# Patient Record
Sex: Male | Born: 1940 | Race: White | Hispanic: No | Marital: Married | Smoking: Former smoker
Health system: Southern US, Community
[De-identification: ages and names within clinical notes are randomized; demographics above are authoritative.]

## PROBLEM LIST (undated history)

## (undated) DIAGNOSIS — N189 Chronic kidney disease, unspecified: Secondary | ICD-10-CM

## (undated) DIAGNOSIS — Z87438 Personal history of other diseases of male genital organs: Secondary | ICD-10-CM

## (undated) DIAGNOSIS — K635 Polyp of colon: Secondary | ICD-10-CM

## (undated) DIAGNOSIS — K759 Inflammatory liver disease, unspecified: Secondary | ICD-10-CM

## (undated) DIAGNOSIS — I10 Essential (primary) hypertension: Secondary | ICD-10-CM

## (undated) DIAGNOSIS — Z87891 Personal history of nicotine dependence: Secondary | ICD-10-CM

## (undated) DIAGNOSIS — I471 Supraventricular tachycardia, unspecified: Secondary | ICD-10-CM

## (undated) DIAGNOSIS — M1990 Unspecified osteoarthritis, unspecified site: Secondary | ICD-10-CM

## (undated) DIAGNOSIS — Z9289 Personal history of other medical treatment: Secondary | ICD-10-CM

## (undated) DIAGNOSIS — J189 Pneumonia, unspecified organism: Secondary | ICD-10-CM

## (undated) DIAGNOSIS — R7301 Impaired fasting glucose: Secondary | ICD-10-CM

## (undated) DIAGNOSIS — J449 Chronic obstructive pulmonary disease, unspecified: Secondary | ICD-10-CM

## (undated) DIAGNOSIS — Z950 Presence of cardiac pacemaker: Secondary | ICD-10-CM

## (undated) DIAGNOSIS — Z136 Encounter for screening for cardiovascular disorders: Secondary | ICD-10-CM

## (undated) HISTORY — DX: Encounter for screening for cardiovascular disorders: Z13.6

## (undated) HISTORY — PX: COLONOSCOPY: SHX174

## (undated) HISTORY — DX: Supraventricular tachycardia, unspecified: I47.10

## (undated) HISTORY — DX: Essential (primary) hypertension: I10

## (undated) HISTORY — PX: REPLACEMENT TOTAL KNEE BILATERAL: SUR1225

## (undated) HISTORY — DX: Personal history of other medical treatment: Z92.89

## (undated) HISTORY — DX: Personal history of nicotine dependence: Z87.891

## (undated) HISTORY — PX: CATARACT EXTRACTION W/ INTRAOCULAR LENS IMPLANT: SHX1309

## (undated) HISTORY — PX: APPENDECTOMY: SHX54

## (undated) HISTORY — DX: Impaired fasting glucose: R73.01

## (undated) HISTORY — DX: Personal history of other diseases of male genital organs: Z87.438

## (undated) HISTORY — DX: Polyp of colon: K63.5

## (undated) HISTORY — DX: Supraventricular tachycardia: I47.1

---

## 2001-05-05 ENCOUNTER — Ambulatory Visit (HOSPITAL_COMMUNITY): Admission: RE | Admit: 2001-05-05 | Discharge: 2001-05-05 | Payer: Self-pay | Admitting: *Deleted

## 2002-12-20 ENCOUNTER — Encounter: Payer: Self-pay | Admitting: Orthopedic Surgery

## 2002-12-21 ENCOUNTER — Inpatient Hospital Stay (HOSPITAL_COMMUNITY): Admission: RE | Admit: 2002-12-21 | Discharge: 2002-12-23 | Payer: Self-pay | Admitting: Orthopedic Surgery

## 2006-05-19 ENCOUNTER — Ambulatory Visit (HOSPITAL_COMMUNITY): Admission: RE | Admit: 2006-05-19 | Discharge: 2006-05-19 | Payer: Self-pay | Admitting: Family Medicine

## 2006-05-22 ENCOUNTER — Observation Stay (HOSPITAL_COMMUNITY): Admission: RE | Admit: 2006-05-22 | Discharge: 2006-05-23 | Payer: Self-pay | Admitting: Orthopedic Surgery

## 2007-05-03 ENCOUNTER — Ambulatory Visit: Payer: Self-pay | Admitting: Surgery

## 2008-06-02 HISTORY — PX: TRANSURETHRAL RESECTION OF PROSTATE: SHX73

## 2008-07-31 DIAGNOSIS — K635 Polyp of colon: Secondary | ICD-10-CM

## 2008-07-31 HISTORY — DX: Polyp of colon: K63.5

## 2008-09-07 ENCOUNTER — Encounter: Admission: RE | Admit: 2008-09-07 | Discharge: 2008-09-07 | Payer: Self-pay | Admitting: Family Medicine

## 2008-10-04 ENCOUNTER — Encounter (INDEPENDENT_AMBULATORY_CARE_PROVIDER_SITE_OTHER): Payer: Self-pay | Admitting: Urology

## 2008-10-04 ENCOUNTER — Ambulatory Visit (HOSPITAL_COMMUNITY): Admission: RE | Admit: 2008-10-04 | Discharge: 2008-10-05 | Payer: Self-pay | Admitting: Urology

## 2010-06-02 DIAGNOSIS — Z9289 Personal history of other medical treatment: Secondary | ICD-10-CM

## 2010-06-02 HISTORY — DX: Personal history of other medical treatment: Z92.89

## 2010-09-10 LAB — CBC
HCT: 39 % (ref 39.0–52.0)
Platelets: 176 10*3/uL (ref 150–400)
WBC: 7.1 10*3/uL (ref 4.0–10.5)

## 2010-09-10 LAB — BASIC METABOLIC PANEL
BUN: 11 mg/dL (ref 6–23)
BUN: 23 mg/dL (ref 6–23)
CO2: 27 mEq/L (ref 19–32)
Calcium: 8.1 mg/dL — ABNORMAL LOW (ref 8.4–10.5)
Chloride: 105 mEq/L (ref 96–112)
Creatinine, Ser: 1.33 mg/dL (ref 0.4–1.5)
GFR calc non Af Amer: 53 mL/min — ABNORMAL LOW (ref >60)
Glucose, Bld: 107 mg/dL — ABNORMAL HIGH (ref 70–99)
Potassium: 4.1 mEq/L (ref 3.5–5.1)
Potassium: 4.4 mEq/L (ref 3.5–5.1)

## 2010-09-10 LAB — HEMOGLOBIN AND HEMATOCRIT, BLOOD
HCT: 41.3 % (ref 39.0–52.0)
Hemoglobin: 14.3 g/dL (ref 13.0–17.0)

## 2010-10-15 NOTE — Op Note (Signed)
NAME:  [name] [name], [name] [name]              ACCOUNT NO.:  [id number]   MEDICAL RECORD NO.:  [id number]          PATIENT TYPE:  AMB   LOCATION:  DAY                          FACILITY:  [facility]   PHYSICIAN:  Martina Sinner, MD DATE OF BIRTH:  09/01/1940   DATE OF PROCEDURE:  DATE OF DISCHARGE:                               OPERATIVE REPORT   PREOPERATIVE DIAGNOSES:  Urinary retention with hydronephrosis.   PREOPERATIVE DIAGNOSES:  Urinary retention with hydronephrosis.   SURGERY:  Transurethral resection of the prostate plus cystoscopy.   Mr. [name] [name] had bilateral hydronephrosis which resolved dramatically  with a Foley catheter.  He is now on clean intermittent catheterization  and had a negative culture prior to surgery.  He is on ciprofloxacin.  His creatinine is 1.4-1.5   The patient was prepped and draped in the usual fashion.  Preoperative  antibiotics were given.  Hemoglobin was normal prior to the procedure.   I used the 28-French resectoscope.  He had a reasonably normal size  urethral meatus, but I did dilate it gently to 30-French allowing me to  insert the telescope.  I entered the urethra under direct vision.   Prior to inserting the Storz resectoscope, which is a three-way device,  I actually cystoscoped the patient with a 21-French 30 degree lens and a  70 degrees lens.  He has over a 2 liter bladder and I scanned the entire  urothelium three times and especially near the bladder neck and floor.  I did not see any tumor.  He had a very high riding bladder neck with a  lobular middle lobe and I really believe that this is what was seen on  CT scan.  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  He had a large middle lobe.  He had bilobar enlargement of the  prostate.   Using the resectoscope, I identified the interureteric ridge and  ureteral orifices.  I then resected the middle lobe just flattening it  nearly level with the floor of the  bladder, but I did not undermine the  trigone or over resect.  I did my half loop technique of the bladder  neck and eventually deepened this.  I then used the well identified  verumontanum to resect to the surgical capsule both walls of the  prostate with a modest amount of tissue at 5 and 7 o'clock.   He had soft tissue falling in at the level of the bladder neck between  10 and 2 o'clock.  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  It was resected.  He had a moderate amount of apical  tissue, and I was very careful in resecting the apical tissue in a  conservative fashion using the proximal verumontanum as a marker.  I was  also aware during the case to minimize the length of my resection  between 2 and 10 o'clock,  since he had a short prostatic urethra and I  did not want to jeopardize the sphincter.  He did have a moderate amount  of tissue falling down from above.   There was very little bleeding during the case.  I went to the surgical  capsule leaving some tissue at the apex, but I was very happy opening  the bladder neck and removing what was probably an obvious obstructing  middle lobe.  I emptied the bladder manner and our in and out volume  corresponded very well.  There was no suprapubic fullness.  I irrigated  all fragments and had a good look inside the large bladder and saw no  more residual fragments.  I was happy with hemostasis.   At the end of the case, I checked the sphincter.  I checked this twice  and I felt that his sphincter actually looked a little bit open and it  was not necessarily due to the entire bladder and prostatic urethra  being full.  He does not have a risk factor necessarily for a denervated  sphincter, but it was a little bit concerning.  I looked carefully and  my resection was  not impinging upon the sphincter and again throughout  the case, I used the proximal verumontanum, not resecting beyond this.   With some fluid in the bladder, I inserted a 24-French irrigating  catheter.  I then irrigated the bladder and it was nearly clear.  I did  not put the catheter on retention, but 30 mL of sterile water was placed  in the balloon.  The catheter was draining well with continual bladder  irrigation at the end of the case.   I am hoping this improves the efficiency of Mr. [name] [name]'s emptying.  I  am going to ask the pathologist to look for any transitional cell  carcinoma of the bladder, but I will be very surprised if he had any.  I  will be diligent postprocedure in checking Mr. [name] [name]'s continence  status because of the findings described above.           ______________________________  Martina Sinner, MD  Electronically Signed     SAM/MEDQ  D:  10/04/2008  T:  10/04/2008  Job:  [id number]

## 2010-10-15 NOTE — Assessment & Plan Note (Signed)
OFFICE VISIT   [name] [name], [name] [name]  DOB:  February 17, 1941                                       05/03/2007  CHART#:[id number]   REASON FOR VISIT:  Numb feet.   HISTORY:  This is a 70 year old gentleman I am seeing at the request of  Dr. Theresia Lo for evaluation of numbness in both of his feet,  specifically in his great toe.  Patient states that this has been going  on for several months.  He does not have any aggravating or relieving  factors.  It is not worse at any point in time during the day, whether  it be morning or evening.  He does not state that it is better or worse  after long periods of standing.  He has never had anything like this  before.  He has no history of DVT.  He has no history of diabetes.  He  has no history of gout.  He describes it as a numbness in the bottom of  his great toe extending to the ball of his foot.  He denies having any  history of ulcers.   REVIEW OF SYSTEMS:  GENERAL:  No weight gain, no weight loss.  CARDIAC:  Negative.  PULMONARY:  Negative.  GI:  Negative.  GU:  Negative.  VASCULAR:  As above.  NEURO:  Negative.  ORTHO:  Negative.  PSYCH:  Negative.  ENT:  Negative.  HEME:  Negative.   PAST MEDICAL HISTORY:  Significant for hypertension.   PAST SURGICAL HISTORY:  Bilateral knee replacements, appendectomy, right  knee tendon repair.   FAMILY HISTORY:  Negative.   SOCIAL HISTORY:  He is married with 2 children and retired.  He does not  smoke.  He has a history of smoking, quit in 1987.  He drinks 2 glasses  of wine per day.   MEDICATIONS:  Micardis/HCTZ 80/12.5 1 per day, aspirin 81 mg per day.   ALLERGIES:  None.   PHYSICAL EXAMINATION:  VITAL SIGNS:  Heart rate 67, blood pressure  133/79, O2 saturation 97%.  GENERAL:  He is well appearing, in no acute distress.  HEENT:  His pupils are equal.  His sclerae are anicteric.  CARDIOVASCULAR:  Regular.  RESPIRATORY:  Nonlabored.  ABDOMEN:  Soft,  nontender.  EXTREMITIES:  He has 1+ edema in both lower extremities.  There are  palpable dorsalis pedis pulses bilaterally.  There are no open wounds.  There is no cellulitis. There are small, but visible, engorged veins in  his lower extremities.   DIAGNOSTIC STUDIES:  Duplex evaluation was performed today which reveals  normal arterial vasculature.   ASSESSMENT AND PLAN:  Bilateral lower extremity numbness.   PLAN:  The patient does not have any signs or symptoms of arterial  pathology.  On physical exam he does have evidence of edema as well as  prominent veins in his lower extremities.  His numbness could be due to  venous insufficiency.  We discussed about leg elevation and I have given  him a prescription for knee high 20 to 30 mm compression stockings for  him to use on a trial basis.  I have scheduled him to come back for a  venous insufficiency duplex in 3 months and to see me following that.  If he has had no improvement with leg elevation and compression, it  is  unlikely that his numbness is due to venous insufficiency and;  therefore, we will need to pursue another etiology for his difficulties.   Jorge Ny, MD  Electronically Signed   VWB/MEDQ  D:  05/03/2007  T:  05/04/2007  Job:  [id number]   cc:   Vikki Ports, M.D.

## 2010-10-18 NOTE — Op Note (Signed)
NAME:  GUENTHER, DUNSHEE                        ACCOUNT NO.:  [id number]   MEDICAL RECORD NO.:  [id number]                   PATIENT TYPE:  INP   LOCATION:  2899                                 FACILITY:  [facility]   PHYSICIAN:  Feliberto Gottron. Turner Daniels, M.D.                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   PROCEDURE:  Left total knee arthroplasty using Osteonics Scorpio components,  all cemented, 11 femur, 11 tibia, 9 patella, and 10 PS spacer.   SURGEON:  Feliberto Gottron. Turner Daniels, M.D.   FIRST ASSISTANT:  Skip Mayer, P.A.-C.   ANESTHESIA:  General endotracheal anesthesia.   ESTIMATED BLOOD LOSS:  Minimal.   FLUIDS REPLACED:  1500 mL crystalloid.   TOURNIQUET TIME:  1 hour 15 minutes.   INDICATIONS FOR PROCEDURE:  70 year old man who has had a right total knee  in the past, done well, and now desires a left total knee for bone on bone  arthritis of the left knee with a 15 degree flexion contracture, 10 degree  varus configuration to the knee, and some mild subluxation of the tibia  under the femur.  He has failed conservative treatment with anti-  inflammatory medicines and exercise and desires elective left total knee  arthroplasty.   DESCRIPTION OF PROCEDURE:  The patient was identified by arm band and taken  to the operating room at [facility].  Appropriate anesthetic  monitors were attached and general endotracheal anesthesia was induced with  the patient in the supine position.  Lateral posts and foot positioner  applied to the table.  A tourniquet was applied high to the left thigh and  the left lower extremity prepped and draped in the usual sterile fashion  from  the ankle to the tourniquet, limb bent to 90 degrees, tourniquet  inflated to 350 mmHg and we began the procedure by making an anterior  midline incision 18 cm in length centered over the patella, cutting through  the skin and subcutaneous tissue down to the level of the transverse  retinaculum over the patella which was then reflected medially allowing a  standard medial parapatellar arthrotomy.  The superficial medial collateral  ligament was peeled anterior to posterior keeping it intact distally off the  tibia to enhance the exposure of the medial side and also to release the  medial ligaments.  The patella was everted, the prepatellar fat pad was then  resected with electrocautery and the knee was hyperflexed.  The anterior  half of the medial and lateral menisci were then resected along with the  cruciate ligaments using the electrocautery.  A Z-retractor was placed  posteromedial through the notch bringing the tibia forward and a Holman  laterally enhancing the proximal tibial exposure.   The proximal tibia was then instrumented with the Osteonics step drill  followed by the IM rod and a 0 degree posterior slope cutting guide.  We  resected 3-4 mm of bone medially, 10 mm of bone laterally, consistent with a  varus deformity.  The distal femur was then entered with the Osteonics step  drill 10 mm anterior to the PCL origin and a 5 degree left distal femoral  cutting guide was then pinned into place using the IM rod and the distal 10  mm of the femur resected.  We sized for a number 11 femoral component,  externally rotated the cutting block 3 degrees, and performed our standard  anterior, posterior, and chamfer cuts followed by the [employer] box  cut.  The everted patella was then grasped with the patellar cutting guide  and the posterior 9-10 mm of the patella resected.  It was sized for a  number 9 modified patellar button and drilled to the template.  A trial #11  left  femoral component was hammered into place, a #11 trial tibial baseplate  with a 10 PS trial spacer was placed in the tibia.  The knee was taken  through a range of motion with excellent stability noted, good tracking of  the trial patellar component.   At this point, the trials were removed, the knee was hyperflexed, the  proximal tibia once again exposed, and an 11 tibial baseplate trial hammered  into place allowing the delta fit keel punches up to an 11/13 cemented delta  keel punch.  All bony surfaces were then cleaned and dried with suction and  sponges.  A double batch of Palacos polymethyl methacrylate cement with 1500  mg of Zinacef was then mixed and applied to the mating surfaces of the 11  tibial baseplate, 11 femoral component, and the patella as well as to all  bony mating surfaces except for the posterior condyles of the femur.  In  order, we then hammered into place the #11 tibial baseplate, removed the  excess cement, the #11 left femoral component, and once again removed the  excess cement, and then snapped in the 10 PS spacer.  The knee was reduced  and taken to full extension, further compressing the cement.  The patella  was then fixed using the 9 modified medial patellar button which was then  squeezed into place with the clamp and excess cement removed.  The knee was  taken to 90 degrees of flexion while keeping it compressed to allow further  removal of any excess bits of cement.  The cement was allowed to cure.  Medium Hemovacs were placed in deep in the wound after taking the knee  through a range of motion and documenting stability.  The knee was once  again water picked cleaned, and the knee was then closed with running #1  Vicryl suture in the parapatellar arthrotomy, 0 and 2-0 undyed Vicryl suture  in the subcutaneous tissue, and skin staples, followed by a dressing of Xeroform, 4 by 4 dressing sponges, Webril, and an Ace wrap.  The patient was  then awakened  and taken to the recovery room without difficulty.  Feliberto Gottron. Turner Daniels, M.D.    Ovid Curd  D:  12/21/2002  T:  12/21/2002  Job:  [id number]

## 2010-10-18 NOTE — Discharge Summary (Signed)
NAME:  [name] [name], [name] [name]                        ACCOUNT NO.:  [id number]   MEDICAL RECORD NO.:  [id number]                   PATIENT TYPE:  INP   LOCATION:  5031                                 FACILITY:  [facility]   PHYSICIAN:  Feliberto Gottron. Turner Daniels, M.D.                DATE OF BIRTH:  [birth year]   DATE OF ADMISSION:  12/21/2002  DATE OF DISCHARGE:  12/23/2002                                 DISCHARGE SUMMARY   PRIMARY DIAGNOSIS FOR THIS ADMISSION:  End-stage degenerative joint disease  of the left knee.   PROCEDURE WHILE IN HOSPITAL:  Left total knee arthroplasty.   DISCHARGE SUMMARY:  The patient is a 70 year old man who has had a right  total knee in the past and has done well with this. He now desires a left  total knee for bone on bone arthritis of the left knee with 15 degree  flexion contracture, 10 degree varus configuration, and some mild  subluxation of the tibia into the femur. He has failed conservative  treatment including anti-inflammatory medication, PT, rest, and pain  medications. He now wishes a total knee arthroplasty after discussing risks  versus benefits.   ALLERGIES:  No known drug allergies.   MEDICATIONS AT TIME OF ADMISSION:  Bisoprolol 6/6.25, Bextra.   PAST MEDICAL HISTORY:  1. Childhood diseases.  2. Hypertension.  3. DJD.  4. 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  5. Questionable hepatitis in the past.   PAST SURGICAL HISTORY:  1. Appendectomy in 1960.  2. Right total knee arthroplasty in 1997. No difficulties with GET.   SOCIAL HISTORY:  No tobacco. Positive ethanol, one to two alcohol beverages  per week. No IV drug abuse. 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   FAMILY HISTORY:  Mother alive at 67. Father died at 23 accidental death but  with a positive history of kidney disease and heart disease.   REVIEW OF SYSTEMS:  Positive for glasses, decreased hearing, no dentures. He  denies any  shortness of breath or chest pain.   PHYSICAL EXAMINATION:  VITAL SIGNS:  Temperature 97.2, pulse 60,  respirations 16, blood pressure 130/80.  GENERAL:  The patient is a 6 foot 1, 205-pound male.  HEENT:  Head is normocephalic, atraumatic. Ears:  TMs are clear. Eyes:  Pupils are equal, round, and reactive to light and accommodation. Nose:  Patent. Throat:  Benign.  NECK:  Full range of motion.  CHEST:  Clear to auscultation.  HEART:  Regular rate and rhythm.  ABDOMEN:  Soft, nontender.  EXTREMITIES:  Right total knee arthroplasty, has well healed normal scar  with range of motion 0 to 120, stable ligamentously. Left knee range of  motion 10 to 100 degrees, 10 degrees varus deformity, stable ligamentously,  positive pain with range of motion, positive crepitus.   STUDIES:  X-rays showed bone  on bone changes in the medial compartment with  positive osteophyte formation. Preoperative labs including CBC, CMET, chest  x-ray, EKG, PT, and PTT were all within normal limits with the exception of  sinus bradycardia on the EKG and a creatinine of 1.8.   HOSPITAL COURSE:  On the date of admission, the patient was taken to the  operating room at [facility] where he underwent a left total knee  arthroplasty with Osteonics Scorpio components, also a cemented #11 femur,  #11 tibia, #9 patella, 10-mm PS spacer. The patient was placed on  preoperative antibiotics for the first 48 hours. He was placed on  postoperative Coumadin prophylaxis with a target INR of 1.5 to 2 per  pharmacy protocol. He was placed on a PCA Dilaudid pump postoperatively for  pain control. Physical therapy was begun with CPM in the postoperative area  as well as physical therapy the first postoperative day. Postoperative day  #1, the patient was without complaint of nausea or vomiting and was using  PCA occasionally. Vital signs were stable. T-max 100.4. Dressing was dry.  Hemovac output 550 per shift. He was  neurovascularly intact to all motors  and light touch. Hemoglobin 10.3. Otherwise, making slow and steady  progress. His PCA was discontinued. Postoperative day #2, the patient was  without complaint, out of bed with physical therapy's help. T-max 99.1,  vital signs were stable. Hemoglobin 10.4. INR 1.1. Dressing was dry. Hemovac  was discontinued. Calf was soft. He continued to make steady progress with  physical therapy, and [facility] had been contacted to coordinate his  home care, including his durable medical goods. He was found to be having  met his physical therapy goals by that afternoon session and was otherwise  medically stable, doing well on oral pain medications and eating and  drinking without nausea or vomiting, voiding without difficulty, out of bed  and other transfers only minimal assist.   He was thus discharged home to the care of his family. He will have Vicodin  for pain medication. He will continue with his Coumadin per pharmacy  protocol, followed by the pharmacist at [facility] for two weeks  postoperatively. He will continue to exercise total knee precautions and  weight bearing using a walker, weight bearing as tolerated. Diet is regular.  He will return to clinic in one week's time, or sooner if he should have any  difficulties with the wound, fever, or pain that is not well controlled with  oral pain medications.      Laural Benes. Jannet Mantis.                     Feliberto Gottron. Turner Daniels, M.D.    Merita Norton  D:  01/23/2003  T:  01/24/2003  Job:  [id number]

## 2010-10-18 NOTE — Op Note (Signed)
[name] [name] NO.:  [id number]   MEDICAL RECORD NO.:  [id number]          PATIENT TYPE:  INP   LOCATION:  5003                         FACILITY:  [facility]   PHYSICIAN:  Feliberto Gottron. Turner Daniels, M.D.   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   PROCEDURE:  Right quad tendon repair using two #5 fiber wires through  vertical drill holes in the patella and a running whip stitch up and  down the tendon x2.   SURGEON:  Feliberto Gottron. Turner Daniels, M.D.   FIRST ASSISTANT:  Skip Mayer, P.A.-C.   ANESTHETIC:  General endotracheal.   ESTIMATED BLOOD LOSS:  100 mL.   FLUID REPLACEMENT:  800 mL crystalloid.   DRAINS PLACED:  None.   TOURNIQUET TIME:  1 hour.   INDICATIONS FOR PROCEDURE:  A 70 year old gentleman who had a left total  knee done by me some years ago.  On the right side, he had a hybrid  total knee done about 10 years ago somewhere up in [place].  Was moving  some laundry, slipped and sustained a right quad rupture 2-3 days ago.  Seen by my partner, Dr. Althea Charon.  MRI scan confirmed the diagnosis and  is taken for right quad tendon repair since he has absolutely no active  extension power to his right leg.  Risks and benefits of surgery  discussed.  Plain radiographs show the total knee to be well-fixed.  MRI  scan shows the quad tendon rupture.   DESCRIPTION OF PROCEDURE:  The patient identified by armband, taken to  the operating room at [facility].  Appropriate site  monitors were attached and general endotracheal anesthesia induced with  the patient in supine position.  He received 1 g of Ancef  preoperatively.  A tourniquet was applied high to the right thigh and  right lower extremity prepped and draped in usual  sterile fashion from  the ankle to the tourniquet.  The limb was wrapped with an Esmarch  bandage, tourniquet inflated to 350 mmHg.  We began the procedure by  reproducing the proximal half of the total knee incision and then going  proximally for another 2 cm.  Small bleeders in the skin and  subcutaneous tissue identified and cauterized.  Transverse retinaculum  identified and divided medially and laterally.  We immediately  encountered the complete total quad rupture off the proximal pole of the  patella.  The ends of the quad rupture were then freshened with a #10  blade, removing a millimeter or 2 of tissue from either side, and then  we placed vertical drill holes in the patella going from proximal to  distal with a 1/8-inch drill bit.  We then passed a double loop of #5  fiber wire through the proximal aspect of one drill hole, brought across  distally to the other drill hole and  then brought it back up.  So we now  had 2 free end sutures looped through the patella.  Double running whip  stitch was then passed up and down the quad tendon with two more fiber  wires, and then the free ends were tied medial and lateral x2, obtaining  a good firm repair of the quad tendon.  We then over sewed the repair  with running #1 Vicryl, going from medial to lateral and lateral to  medial to complete the repair.  The tourniquet was let down.  Small  bleeders identified and cauterized.  The wound was thoroughly irrigated  out with normal saline solution.  The subcutaneous tissue was then  closed with 0-0 and 2-0 Vicryl suture and the skin with skin staples.  A  dressing of Xeroform, 4x4 dressing, sponges, Webril and an Ace wrap and  a knee immobilizer applied.  The patient was then awakened and taken to  recovery room without difficulty.      Feliberto Gottron. Turner Daniels, M.D.  Electronically Signed     FJR/MEDQ  D:  05/22/2006  T:  05/23/2006  Job:  [id number]

## 2011-06-22 DIAGNOSIS — N390 Urinary tract infection, site not specified: Secondary | ICD-10-CM | POA: Diagnosis not present

## 2011-06-22 DIAGNOSIS — N3000 Acute cystitis without hematuria: Secondary | ICD-10-CM | POA: Diagnosis not present

## 2011-07-03 DIAGNOSIS — R7301 Impaired fasting glucose: Secondary | ICD-10-CM | POA: Diagnosis not present

## 2011-07-03 DIAGNOSIS — I10 Essential (primary) hypertension: Secondary | ICD-10-CM | POA: Diagnosis not present

## 2011-07-03 DIAGNOSIS — R799 Abnormal finding of blood chemistry, unspecified: Secondary | ICD-10-CM | POA: Diagnosis not present

## 2011-07-03 DIAGNOSIS — N3000 Acute cystitis without hematuria: Secondary | ICD-10-CM | POA: Diagnosis not present

## 2011-08-24 DIAGNOSIS — R300 Dysuria: Secondary | ICD-10-CM | POA: Diagnosis not present

## 2011-08-24 DIAGNOSIS — R319 Hematuria, unspecified: Secondary | ICD-10-CM | POA: Diagnosis not present

## 2011-08-24 DIAGNOSIS — N3000 Acute cystitis without hematuria: Secondary | ICD-10-CM | POA: Diagnosis not present

## 2011-09-09 DIAGNOSIS — R82998 Other abnormal findings in urine: Secondary | ICD-10-CM | POA: Diagnosis not present

## 2011-09-09 DIAGNOSIS — R339 Retention of urine, unspecified: Secondary | ICD-10-CM | POA: Diagnosis not present

## 2012-01-21 DIAGNOSIS — N281 Cyst of kidney, acquired: Secondary | ICD-10-CM | POA: Diagnosis not present

## 2012-01-21 DIAGNOSIS — N1330 Unspecified hydronephrosis: Secondary | ICD-10-CM | POA: Diagnosis not present

## 2012-01-21 DIAGNOSIS — R339 Retention of urine, unspecified: Secondary | ICD-10-CM | POA: Diagnosis not present

## 2012-03-21 DIAGNOSIS — N390 Urinary tract infection, site not specified: Secondary | ICD-10-CM | POA: Diagnosis not present

## 2012-05-28 DIAGNOSIS — Z23 Encounter for immunization: Secondary | ICD-10-CM | POA: Diagnosis not present

## 2012-09-07 DIAGNOSIS — N289 Disorder of kidney and ureter, unspecified: Secondary | ICD-10-CM | POA: Diagnosis not present

## 2012-09-07 DIAGNOSIS — R7301 Impaired fasting glucose: Secondary | ICD-10-CM | POA: Diagnosis not present

## 2012-09-07 DIAGNOSIS — I10 Essential (primary) hypertension: Secondary | ICD-10-CM | POA: Diagnosis not present

## 2012-09-07 DIAGNOSIS — Z1331 Encounter for screening for depression: Secondary | ICD-10-CM | POA: Diagnosis not present

## 2012-09-07 DIAGNOSIS — E7800 Pure hypercholesterolemia, unspecified: Secondary | ICD-10-CM | POA: Diagnosis not present

## 2012-10-12 DIAGNOSIS — H2510 Age-related nuclear cataract, unspecified eye: Secondary | ICD-10-CM | POA: Diagnosis not present

## 2012-10-14 DIAGNOSIS — R82998 Other abnormal findings in urine: Secondary | ICD-10-CM | POA: Diagnosis not present

## 2012-11-11 DIAGNOSIS — R300 Dysuria: Secondary | ICD-10-CM | POA: Diagnosis not present

## 2013-01-05 DIAGNOSIS — H6980 Other specified disorders of Eustachian tube, unspecified ear: Secondary | ICD-10-CM | POA: Diagnosis not present

## 2013-01-05 DIAGNOSIS — H9190 Unspecified hearing loss, unspecified ear: Secondary | ICD-10-CM | POA: Diagnosis not present

## 2013-01-21 DIAGNOSIS — R339 Retention of urine, unspecified: Secondary | ICD-10-CM | POA: Diagnosis not present

## 2013-01-21 DIAGNOSIS — N1330 Unspecified hydronephrosis: Secondary | ICD-10-CM | POA: Diagnosis not present

## 2013-03-16 DIAGNOSIS — Z23 Encounter for immunization: Secondary | ICD-10-CM | POA: Diagnosis not present

## 2013-03-16 DIAGNOSIS — R82998 Other abnormal findings in urine: Secondary | ICD-10-CM | POA: Diagnosis not present

## 2013-03-16 DIAGNOSIS — N390 Urinary tract infection, site not specified: Secondary | ICD-10-CM | POA: Diagnosis not present

## 2013-06-07 DIAGNOSIS — I10 Essential (primary) hypertension: Secondary | ICD-10-CM | POA: Diagnosis not present

## 2013-08-19 DIAGNOSIS — R7301 Impaired fasting glucose: Secondary | ICD-10-CM | POA: Diagnosis not present

## 2013-08-19 DIAGNOSIS — I10 Essential (primary) hypertension: Secondary | ICD-10-CM | POA: Diagnosis not present

## 2013-08-19 DIAGNOSIS — Z23 Encounter for immunization: Secondary | ICD-10-CM | POA: Diagnosis not present

## 2013-08-19 DIAGNOSIS — N289 Disorder of kidney and ureter, unspecified: Secondary | ICD-10-CM | POA: Diagnosis not present

## 2013-08-22 DIAGNOSIS — I10 Essential (primary) hypertension: Secondary | ICD-10-CM | POA: Diagnosis not present

## 2014-01-30 DIAGNOSIS — R339 Retention of urine, unspecified: Secondary | ICD-10-CM | POA: Diagnosis not present

## 2014-01-30 DIAGNOSIS — N3020 Other chronic cystitis without hematuria: Secondary | ICD-10-CM | POA: Diagnosis not present

## 2014-01-30 DIAGNOSIS — N400 Enlarged prostate without lower urinary tract symptoms: Secondary | ICD-10-CM | POA: Diagnosis not present

## 2014-01-30 DIAGNOSIS — R39198 Other difficulties with micturition: Secondary | ICD-10-CM | POA: Diagnosis not present

## 2014-01-30 DIAGNOSIS — R82998 Other abnormal findings in urine: Secondary | ICD-10-CM | POA: Diagnosis not present

## 2014-01-30 DIAGNOSIS — N2889 Other specified disorders of kidney and ureter: Secondary | ICD-10-CM | POA: Diagnosis not present

## 2014-07-24 ENCOUNTER — Other Ambulatory Visit: Payer: Self-pay | Admitting: Family Medicine

## 2014-07-24 DIAGNOSIS — I10 Essential (primary) hypertension: Secondary | ICD-10-CM | POA: Diagnosis not present

## 2014-07-24 DIAGNOSIS — N189 Chronic kidney disease, unspecified: Secondary | ICD-10-CM | POA: Diagnosis not present

## 2014-07-24 DIAGNOSIS — Z789 Other specified health status: Secondary | ICD-10-CM | POA: Diagnosis not present

## 2014-07-24 DIAGNOSIS — N400 Enlarged prostate without lower urinary tract symptoms: Secondary | ICD-10-CM | POA: Diagnosis not present

## 2014-07-24 DIAGNOSIS — Z0000 Encounter for general adult medical examination without abnormal findings: Secondary | ICD-10-CM | POA: Diagnosis not present

## 2014-07-24 DIAGNOSIS — E663 Overweight: Secondary | ICD-10-CM | POA: Diagnosis not present

## 2014-07-24 DIAGNOSIS — R7301 Impaired fasting glucose: Secondary | ICD-10-CM | POA: Diagnosis not present

## 2014-07-24 DIAGNOSIS — Z136 Encounter for screening for cardiovascular disorders: Secondary | ICD-10-CM | POA: Diagnosis not present

## 2014-08-01 DIAGNOSIS — Z136 Encounter for screening for cardiovascular disorders: Secondary | ICD-10-CM

## 2014-08-01 HISTORY — DX: Encounter for screening for cardiovascular disorders: Z13.6

## 2014-08-03 ENCOUNTER — Encounter (INDEPENDENT_AMBULATORY_CARE_PROVIDER_SITE_OTHER): Payer: Self-pay

## 2014-08-03 ENCOUNTER — Ambulatory Visit
Admission: RE | Admit: 2014-08-03 | Discharge: 2014-08-03 | Disposition: A | Discharge status: Home / Self Care | Payer: Medicare Other | Source: Ambulatory Visit | Attending: Family Medicine | Admitting: Family Medicine

## 2014-08-03 DIAGNOSIS — Z87891 Personal history of nicotine dependence: Secondary | ICD-10-CM | POA: Diagnosis not present

## 2014-08-03 DIAGNOSIS — Z136 Encounter for screening for cardiovascular disorders: Secondary | ICD-10-CM

## 2014-08-03 DIAGNOSIS — I10 Essential (primary) hypertension: Secondary | ICD-10-CM | POA: Diagnosis not present

## 2014-11-27 ENCOUNTER — Other Ambulatory Visit: Payer: Self-pay

## 2015-02-28 DIAGNOSIS — N3020 Other chronic cystitis without hematuria: Secondary | ICD-10-CM | POA: Diagnosis not present

## 2015-02-28 DIAGNOSIS — N1330 Unspecified hydronephrosis: Secondary | ICD-10-CM | POA: Diagnosis not present

## 2015-05-23 DIAGNOSIS — R0609 Other forms of dyspnea: Secondary | ICD-10-CM | POA: Diagnosis not present

## 2015-05-23 DIAGNOSIS — I10 Essential (primary) hypertension: Secondary | ICD-10-CM | POA: Diagnosis not present

## 2015-05-23 DIAGNOSIS — Z23 Encounter for immunization: Secondary | ICD-10-CM | POA: Diagnosis not present

## 2015-06-14 ENCOUNTER — Encounter: Payer: Self-pay | Admitting: *Deleted

## 2015-06-15 ENCOUNTER — Encounter: Payer: Self-pay | Admitting: Pulmonary Disease

## 2015-06-15 ENCOUNTER — Ambulatory Visit (INDEPENDENT_AMBULATORY_CARE_PROVIDER_SITE_OTHER): Payer: Medicare Other | Admitting: Pulmonary Disease

## 2015-06-15 VITALS — BP 126/76 | HR 67 | Ht 73.0 in | Wt 209.8 lb

## 2015-06-15 DIAGNOSIS — J439 Emphysema, unspecified: Secondary | ICD-10-CM | POA: Diagnosis not present

## 2015-06-15 MED ORDER — MOMETASONE FURO-FORMOTEROL FUM 200-5 MCG/ACT IN AERO: 2.0000 | INHALATION_SPRAY | Freq: Two times a day (BID) | RESPIRATORY_TRACT | Status: DC

## 2015-06-15 NOTE — Patient Instructions (Signed)
Continue using the [facility] as prescribed. We will try to obtain the results of the lung function tests.  Return to clinic in 6 months.

## 2015-06-15 NOTE — Addendum Note (Signed)
Addended by: Boone MasterJONES, Zarion Oliff E on: 06/15/2015 11:13 AM   Modules accepted: Orders

## 2015-06-15 NOTE — Progress Notes (Signed)
   Subjective:    Patient ID: [name] [name], male    DOB: 11/02/1940, 75 y.o.   MRN: [id number]  HPI Consult for evaluation of COPD.  [name] PilgrimMr. [name] [name] is a [age] year old with past medical history of hypertension, heavy smoking in the past. Complains of dyspnea on exertion for the past 3-4 months. Denies any cough, sputum production, wheezing, fevers, chills. He was seen by Dr. Clarene DukeLittle at the primary care office and given a prednisone taper starting at 60 mg for 12 days and started on [facility] inhaler. He reports that his symptoms have improved and feels close to his baseline right now.  He smoked 3 packs per day for the past 30 years. He quit in 1987. He has a couple of drinks a day. No illegal drug use.  DATA: PFTs 05/23/15 FVC 3.59 (78%) FEV1 1.08 (30%) F/F 38 Severe obstructive lung disease.  No recent CXR   Past Medical History  Diagnosis Date  . HTN (hypertension)   . History of BPH     with obstruction and hydronephrosis and slihtly elevated creatinine, using self catherization, followed by urologist Dr McDiarmid  . Colon polyp March 2010    colonoscopy with Dr Randa EvensEdwards  . Paroxysmal supraventricular tachycardia (HCC)   . Elevated fasting glucose   . History of ETT 2012    [name]  . History of tobacco use     54 pack yr  . Screening for AAA (abdominal aortic aneurysm) 3/16    3cm, nl/early aneurysm, repeat 3 yrs with u/s 3/19     Current outpatient prescriptions:  .  amLODipine (NORVASC) 2.5 MG tablet, Take 2.5 mg by mouth daily., Disp: , Rfl:  .  losartan (COZAAR) 100 MG tablet, Take 100 mg by mouth daily., Disp: , Rfl:  .  mometasone-formoterol (DULERA) 200-5 MCG/ACT AERO, Inhale 2 puffs into the lungs 2 (two) times daily., Disp: , Rfl:  .  trimethoprim (TRIMPEX) 100 MG tablet, Take 100 mg by mouth daily., Disp: , Rfl:    Review of Systems Denies any cough, dyspnea, sputum production, wheezing. Denies any fevers, chills, malaise, loss of weight, loss of  appetite. Denies any nausea, vomiting, diarrhea, constipation. Denies any, palpitations. All other review of systems are negative.    Objective:   Physical Exam Blood pressure 126/76, pulse 67, height 6\' 1"  (1.854 m), weight 209 lb 12.8 oz (95.165 kg), SpO2 98 %.  Gen: No apparent distress Neuro: No gross focal deficits. Neck: No JVD, lymphadenopathy, thyromegaly. RS: Clear, No wheeze or crackles. CVS: S1-S2 heard, no murmurs rubs gallops. Abdomen: Soft, positive bowel sounds. Extremities: No edema.    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  [name] [name] had an acute exacerbation in December. He has been adequately treated with a prednisone taper and is now on Dulera. He feels that his symptoms have improved markedly and he feels close to his baseline. Since he is well controlled on Dulera will continue the same. I discussed the natural progression of COPD with him and his wife.  He will be monitored for any change in lung function, exacerbations to determine if he needs to be on any additional therapy.  Plan: - Continue Dulera  Return to clinic in 6 months  Chilton GreathousePraveen Johnica Armwood MD [facility] Pager [phone] If no answer or after 3pm call: [phone] 06/15/2015, 10:59 AM

## 2015-07-05 DIAGNOSIS — H2513 Age-related nuclear cataract, bilateral: Secondary | ICD-10-CM | POA: Diagnosis not present

## 2015-07-09 ENCOUNTER — Encounter: Payer: Self-pay | Admitting: Interventional Cardiology

## 2015-07-18 ENCOUNTER — Ambulatory Visit (INDEPENDENT_AMBULATORY_CARE_PROVIDER_SITE_OTHER): Payer: Medicare Other | Admitting: Interventional Cardiology

## 2015-07-18 ENCOUNTER — Encounter: Payer: Self-pay | Admitting: Interventional Cardiology

## 2015-07-18 VITALS — BP 120/72 | HR 53 | Ht 73.0 in | Wt 211.0 lb

## 2015-07-18 DIAGNOSIS — R0609 Other forms of dyspnea: Secondary | ICD-10-CM

## 2015-07-18 DIAGNOSIS — R0600 Dyspnea, unspecified: Secondary | ICD-10-CM | POA: Diagnosis not present

## 2015-07-18 DIAGNOSIS — J449 Chronic obstructive pulmonary disease, unspecified: Secondary | ICD-10-CM | POA: Insufficient documentation

## 2015-07-18 DIAGNOSIS — I10 Essential (primary) hypertension: Secondary | ICD-10-CM

## 2015-07-18 DIAGNOSIS — J439 Emphysema, unspecified: Secondary | ICD-10-CM | POA: Diagnosis not present

## 2015-07-18 DIAGNOSIS — Z8679 Personal history of other diseases of the circulatory system: Secondary | ICD-10-CM

## 2015-07-18 DIAGNOSIS — Z720 Tobacco use: Secondary | ICD-10-CM

## 2015-07-18 DIAGNOSIS — J432 Centrilobular emphysema: Secondary | ICD-10-CM

## 2015-07-18 DIAGNOSIS — I4510 Unspecified right bundle-branch block: Secondary | ICD-10-CM | POA: Insufficient documentation

## 2015-07-18 DIAGNOSIS — Z87898 Personal history of other specified conditions: Secondary | ICD-10-CM

## 2015-07-18 NOTE — Progress Notes (Signed)
Cardiology Office Note   Date:  07/18/2015   ID:  HIROKI WINT, DOB 1940-09-26, MRN [id number]  PCP:  Mickie Hillier, MD  Cardiologist:  Lesleigh Noe, MD   Chief Complaint  Patient presents with  . Shortness of Breath      History of Present Illness: [name] [name] is a 75 y.o. male who presents for  Dyspnea  and newright bundle branch block.   Pleasant 75 year old gentleman whom I have seen at least twice before in the past. Initially dating back to 2007 but again in 2012. On the most recent evaluation there was a complain of dyspnea. Excess treadmill test and did not reveal evidence of ischemia. He had normal heart rate response. The initial evaluation was in 2007 when he presented for "bradycardia". No significant abnormalities were found. On a treadmill test done 2000 12 he did develop PSVT. He is never had a clinical episode of PSVT outside of that particular occasion.   He now is referred because of dyspnea on exertion. There is no orthopnea, PND, or lower extremity swelling. He denies chest discomfort. He has subsequently been diagnosed with COPD. Due to I was started and he is much improved. He states the breathing is significantly better.  Past Medical History  Diagnosis Date  . HTN (hypertension)   . History of BPH     with obstruction and hydronephrosis and slihtly elevated creatinine, using self catherization, followed by urologist Dr McDiarmid  . Colon polyp March 2010    colonoscopy with Dr Randa Evens  . Paroxysmal supraventricular tachycardia (HCC)   . Elevated fasting glucose   . History of ETT 2012    [name]  . History of tobacco use     54 pack yr  . Screening for AAA (abdominal aortic aneurysm) 3/16    3cm, nl/early aneurysm, repeat 3 yrs with u/s 3/19    Past Surgical History  Procedure Laterality Date  . Replacement total knee bilateral  [id number]  . Appendectomy    . Transurethral resection of prostate  2010     Current  Outpatient Prescriptions  Medication Sig Dispense Refill  . amLODipine (NORVASC) 2.5 MG tablet Take 2.5 mg by mouth daily.    [name] losartan (COZAAR) 100 MG tablet Take 100 mg by mouth daily.    . mometasone-formoterol (DULERA) 200-5 MCG/ACT AERO Inhale 2 puffs into the lungs 2 (two) times daily. 13 g 11  . trimethoprim (TRIMPEX) 100 MG tablet Take 100 mg by mouth daily.     No current facility-administered medications for this visit.    Allergies:   Nsaids    Social History:  The patient  reports that he quit smoking about 30 years ago. His smoking use included Cigarettes. He has a 90 pack-year smoking history. He does not have any smokeless tobacco history on file. He reports that he drinks alcohol.   Family History:  The patient's family history includes Healthy in his brother and brother; Kidney disease in his father. There is no history of Colon cancer or Colon polyps.    ROS:  Please see the history of present illness.   Otherwise, review of systems are positive for  Decreased memory , cough, difficulty urinating, and wheezing..   All other systems are reviewed and negative.    PHYSICAL EXAM: VS:  BP 120/72 mmHg  Pulse 53  Ht  (1.854 m)  Wt 211 lb (95.709 kg)  BMI 27.84 kg/m2 , BMI Body mass index is  27.84 kg/(m^2). GEN: Well nourished, well developed, in no acute distress HEENT: normal Neck: no JVD, carotid bruits, or masses Cardiac: RRR.  There is no murmur, rub, or gallop. There is no edema. Respiratory:  clear to auscultation bilaterally, normal work of breathing. GI: soft, nontender, nondistended, + BS MS: no deformity or atrophy Skin: warm and dry, no rash Neuro:  Strength and sensation are intact Psych: euthymic mood, full affect   EKG:  EKG is ordered today. The ekg reveals nsr with RBBB   Recent Labs: No results found for requested labs within last 365 days.    Lipid Panel No results found for: CHOL, TRIG, HDL, CHOLHDL, VLDL, LDLCALC, LDLDIRECT    Wt  Readings from Last 3 Encounters:  07/18/15 211 lb (95.709 kg)  06/15/15 209 lb 12.8 oz (95.165 kg)      Other studies Reviewed: Additional studies/ records that were reviewed today include:  Prior cardiac workup. The findings include  Prior EKGs, echocardiogram, and excise treadmill test weren't discovered and reviewed. Prior EKGs were normal. Therefore right bundle branch block is new. Prior echocardiogram done in 2012 did not reveal any structural abnormalities. Excise treadmill testing did not reveal any evidence of ischemia. Had normal heart rate response. He did develop PSVT in recovery..    ASSESSMENT AND PLAN:  1. Dyspnea on exertion  Uncertain etiology. With response to bronchodilator therapy, it seems that COPD is the likely explanation. - EKG 12-Lead - Echocardiogram; Future  2. Pulmonary emphysema, unspecified emphysema type (HCC)  New diagnosis  3. Tobacco use  Discontinued 30 years ago  4. Essential hypertension  Controlled  5. Right bundle branch block , new  There is also a first-degree AV block and left axis deviation.   Current medicines are reviewed at length with the patient today.  The patient has the following concerns regarding medicines: none.  The following changes/actions have been instituted:     2-D Doppler echocardiogram to assess pulmonary pressures and right ventricular function in setting of new right bundle branch block and COPD.   Further management will be dependent upon findings.  Labs/ tests ordered today include:   Orders Placed This Encounter  Procedures  . EKG 12-Lead  . Echocardiogram     Disposition:   FU with HS in PRN    Signed, Lesleigh Noe, MD  07/18/2015 10:37 AM    [facility] [address] [place], [place], [place]  [address] Phone: [phone]; Fax: [phone]

## 2015-07-18 NOTE — Patient Instructions (Signed)
Medication Instructions:  Your physician recommends that you continue on your current medications as directed. Please refer to the Current Medication list given to you today.   Labwork: None ordered   Testing/Procedures:Your physician has requested that you have an echocardiogram. Echocardiography is a painless test that uses sound waves to create images of your heart. It provides your doctor with information about the size and shape of your heart and how well your heart's chambers and valves are working. This procedure takes approximately one hour. There are no restrictions for this procedure.    Follow-Up: Your physician recommends that you schedule a follow-up appointment as needed   Any Other Special Instructions Will Be Listed Below (If Applicable).     If you need a refill on your cardiac medications before your next appointment, please call your pharmacy.   

## 2015-07-23 ENCOUNTER — Encounter: Payer: Self-pay | Admitting: Interventional Cardiology

## 2015-07-26 DIAGNOSIS — J449 Chronic obstructive pulmonary disease, unspecified: Secondary | ICD-10-CM | POA: Diagnosis not present

## 2015-07-26 DIAGNOSIS — N400 Enlarged prostate without lower urinary tract symptoms: Secondary | ICD-10-CM | POA: Diagnosis not present

## 2015-07-26 DIAGNOSIS — I10 Essential (primary) hypertension: Secondary | ICD-10-CM | POA: Diagnosis not present

## 2015-07-26 DIAGNOSIS — Z0000 Encounter for general adult medical examination without abnormal findings: Secondary | ICD-10-CM | POA: Diagnosis not present

## 2015-07-26 DIAGNOSIS — N189 Chronic kidney disease, unspecified: Secondary | ICD-10-CM | POA: Diagnosis not present

## 2015-07-26 DIAGNOSIS — Z789 Other specified health status: Secondary | ICD-10-CM | POA: Diagnosis not present

## 2015-07-30 ENCOUNTER — Other Ambulatory Visit: Payer: Self-pay

## 2015-07-30 ENCOUNTER — Ambulatory Visit (HOSPITAL_COMMUNITY): Payer: Medicare Other | Attending: Internal Medicine

## 2015-07-30 DIAGNOSIS — I119 Hypertensive heart disease without heart failure: Secondary | ICD-10-CM | POA: Diagnosis not present

## 2015-07-30 DIAGNOSIS — R0609 Other forms of dyspnea: Secondary | ICD-10-CM | POA: Diagnosis not present

## 2015-07-30 DIAGNOSIS — R0600 Dyspnea, unspecified: Secondary | ICD-10-CM | POA: Diagnosis not present

## 2015-07-30 DIAGNOSIS — Z87891 Personal history of nicotine dependence: Secondary | ICD-10-CM | POA: Diagnosis not present

## 2015-07-30 DIAGNOSIS — I340 Nonrheumatic mitral (valve) insufficiency: Secondary | ICD-10-CM | POA: Diagnosis not present

## 2015-12-20 ENCOUNTER — Other Ambulatory Visit: Payer: Medicare Other

## 2015-12-20 ENCOUNTER — Ambulatory Visit (INDEPENDENT_AMBULATORY_CARE_PROVIDER_SITE_OTHER): Payer: Medicare Other | Admitting: Pulmonary Disease

## 2015-12-20 ENCOUNTER — Encounter: Payer: Self-pay | Admitting: Pulmonary Disease

## 2015-12-20 VITALS — BP 126/80 | HR 61 | Ht 73.0 in | Wt 206.0 lb

## 2015-12-20 DIAGNOSIS — J439 Emphysema, unspecified: Secondary | ICD-10-CM | POA: Diagnosis not present

## 2015-12-20 NOTE — Patient Instructions (Signed)
We will send A1AT levels and phenotype.  Continue Dulera  Return in 6 months

## 2015-12-20 NOTE — Progress Notes (Signed)
   Subjective:    Patient ID: Ananias PilgrimLarry C Charette, male    DOB: 04/13/1941, 75 y.o.   MRN: [id number]  PROBLEM LIST Severe emphysema  HPI [name] is a 75 year old with past medical history of hypertension, heavy smoking in the past. Complains of dyspnea on exertion. Denies any cough, sputum production, wheezing, fevers, chills. He was seen by Dr. Clarene DukeLittle at the primary care office in Dec 2016 for an exacerbation and given a prednisone taper starting at 60 mg for 12 days and started on Dulera inhaler. He reports that his symptoms have improved and feels close to his baseline right now.   DATA: PFTs 05/23/15 FVC 3.59 (78%) FEV1 1.08 (30%) F/F 38 Severe obstructive lung disease.  No recent CXR   Social History: He smoked 3 packs per day for the past 30 years. He quit in 1987. He has a couple of drinks a day. No illegal drug use.  Family History: Father- ESRD  Past Medical History  Diagnosis Date  . HTN (hypertension)   . History of BPH     with obstruction and hydronephrosis and slihtly elevated creatinine, using self catherization, followed by urologist Dr McDiarmid  . Colon polyp March 2010    colonoscopy with Dr Randa EvensEdwards  . Paroxysmal supraventricular tachycardia (HCC)   . Elevated fasting glucose   . History of ETT 2012    [name]  . History of tobacco use     54 pack yr  . Screening for AAA (abdominal aortic aneurysm) 3/16    3cm, nl/early aneurysm, repeat 3 yrs with u/s 3/19     Current outpatient prescriptions:  .  amLODipine (NORVASC) 2.5 MG tablet, Take 2.5 mg by mouth daily., Disp: , Rfl:  .  losartan (COZAAR) 100 MG tablet, Take 100 mg by mouth daily., Disp: , Rfl:  .  mometasone-formoterol (DULERA) 200-5 MCG/ACT AERO, Inhale 2 puffs into the lungs 2 (two) times daily., Disp: 13 g, Rfl: 11 .  trimethoprim (TRIMPEX) 100 MG tablet, Take 100 mg by mouth daily., Disp: , Rfl:    Review of Systems Denies any cough, dyspnea, sputum production, wheezing. Denies  any fevers, chills, malaise, loss of weight, loss of appetite. Denies any nausea, vomiting, diarrhea, constipation. Denies any, palpitations. All other review of systems are negative.    Objective:   Physical Exam Blood pressure 126/80, pulse 61, height 6\' 1"  (1.854 m), weight 206 lb (93.441 kg), SpO2 95 %. Gen: No apparent distress Neuro: No gross focal deficits. Neck: No JVD, lymphadenopathy, thyromegaly. RS: Clear, No wheeze or crackles. CVS: S1-S2 heard, no murmurs rubs gallops. Abdomen: Soft, positive bowel sounds. Extremities: No edema.    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 [name] had an acute exacerbation in December 2016. He has been adequately treated with a prednisone taper and is now on Dulera. He feels that his symptoms have improved markedly and he feels close to his baseline. Since he is well controlled on Dulera will continue the same.  I believe his COPD is secondary to smoking, however we will check Alpha1 Antitrypsin levels for completion.  Plan: - Continue Dulera - Check A1AT levels  Return to clinic in 6 months  Chilton GreathousePraveen Qamar Aughenbaugh MD [facility] Pager [phone] If no answer or after 3pm call: [phone] 12/20/2015, 10:04 AM

## 2015-12-27 LAB — ALPHA-1 ANTITRYPSIN PHENOTYPE: A1 ANTITRYPSIN: 109 mg/dL (ref 83–199)

## 2016-01-07 ENCOUNTER — Telehealth: Payer: Self-pay | Admitting: Pulmonary Disease

## 2016-01-07 NOTE — Telephone Encounter (Signed)
Spoke with pt and gave results. Nothing further needed.  

## 2016-01-07 NOTE — Progress Notes (Signed)
LMOMTCB x 1 

## 2016-02-29 DIAGNOSIS — R3121 Asymptomatic microscopic hematuria: Secondary | ICD-10-CM | POA: Diagnosis not present

## 2016-02-29 DIAGNOSIS — N400 Enlarged prostate without lower urinary tract symptoms: Secondary | ICD-10-CM | POA: Diagnosis not present

## 2016-03-26 DIAGNOSIS — Z23 Encounter for immunization: Secondary | ICD-10-CM | POA: Diagnosis not present

## 2016-06-18 ENCOUNTER — Ambulatory Visit: Payer: Medicare Other | Admitting: Pulmonary Disease

## 2016-06-24 ENCOUNTER — Other Ambulatory Visit: Payer: Self-pay | Admitting: Pulmonary Disease

## 2016-07-03 ENCOUNTER — Encounter: Payer: Self-pay | Admitting: Pulmonary Disease

## 2016-07-03 ENCOUNTER — Ambulatory Visit (INDEPENDENT_AMBULATORY_CARE_PROVIDER_SITE_OTHER)
Admission: RE | Admit: 2016-07-03 | Discharge: 2016-07-03 | Disposition: A | Discharge status: Home / Self Care | Payer: Medicare Other | Source: Ambulatory Visit | Attending: Pulmonary Disease | Admitting: Pulmonary Disease

## 2016-07-03 ENCOUNTER — Ambulatory Visit (INDEPENDENT_AMBULATORY_CARE_PROVIDER_SITE_OTHER): Payer: Medicare Other | Admitting: Pulmonary Disease

## 2016-07-03 VITALS — BP 132/80 | HR 58 | Ht 73.0 in | Wt 212.8 lb

## 2016-07-03 DIAGNOSIS — J432 Centrilobular emphysema: Secondary | ICD-10-CM

## 2016-07-03 DIAGNOSIS — R05 Cough: Secondary | ICD-10-CM

## 2016-07-03 DIAGNOSIS — R059 Cough, unspecified: Secondary | ICD-10-CM

## 2016-07-03 DIAGNOSIS — J449 Chronic obstructive pulmonary disease, unspecified: Secondary | ICD-10-CM

## 2016-07-03 NOTE — Progress Notes (Signed)
[name] PilgrimLarry C [name]    [id number]    03/30/1941  Primary Care Physician:[name],[name] [name] BurrowLORNE, [name] [name]  Referring Physician: Catha GosselinKevin [name], [name] [name], [name]  Chief complaint:   Follow up for COPD GOLD B  HPI: Mr. [name] [name] is a 76 year old with past medical history of COPD (CAT score 10, O exacerbations over past year, FEV1 30%), hypertension, heavy smoking in the past.  He was seen by Dr. Clarene DukeLittle at the primary care office in Dec 2016 for an exacerbation and given a prednisone taper starting at 60 mg for 12 days and started on Dulera inhaler. He reports that his symptoms have improved and feels close to his baseline.  Interim History: He continues on the [facility] inhaler. He feels well with no more exacerbations over the past year. He still has some dyspnea on exertion at baseline. He denies any cough, sputum production, fevers, chills, wheezing.  Outpatient Encounter Prescriptions as of 07/03/2016  Medication Sig  . amLODipine (NORVASC) 2.5 MG tablet Take 2.5 mg by mouth daily.  . DULERA 200-5 MCG/ACT AERO INHALE 2 PUFFS INTO THE LUNGS 2 (TWO) TIMES DAILY.  [name]. losartan (COZAAR) 100 MG tablet Take 100 mg by mouth daily.  [name]. trimethoprim (TRIMPEX) 100 MG tablet Take 100 mg by mouth daily.   No facility-administered encounter medications on file as of 07/03/2016.     Allergies as of 07/03/2016 - Review Complete 07/03/2016  Allergen Reaction Noted  . Nsaids  06/14/2015    Past Medical History:  Diagnosis Date  . Colon polyp March 2010   colonoscopy with Dr Randa EvensEdwards  . Elevated fasting glucose   . History of BPH    with obstruction and hydronephrosis and slihtly elevated creatinine, using self catherization, followed by urologist Dr McDiarmid  . History of ETT 2012   [name]  . History of tobacco use    54 pack yr  . HTN (hypertension)   . Paroxysmal supraventricular tachycardia (HCC)   . Screening for AAA (abdominal aortic aneurysm) 3/16   3cm,  nl/early aneurysm, repeat 3 yrs with u/s 3/19    Past Surgical History:  Procedure Laterality Date  . APPENDECTOMY    . REPLACEMENT TOTAL KNEE BILATERAL  [id number]  . TRANSURETHRAL RESECTION OF PROSTATE  2010    Family History  Problem Relation Age of Onset  . Kidney disease Father     ESRD  . Healthy Brother   . Healthy Brother   . Colon cancer Neg Hx   . Colon polyps Neg Hx     Social History   Social History  . Marital status: Married    Spouse name: N/A  . Number of children: N/A  . Years of education: N/A   Occupational History  . Not on file.   Social History Main Topics  . Smoking status: Former Smoker    Packs/day: 3.00    Years: 30.00    Types: Cigarettes    Quit date: 06/02/1985  . Smokeless tobacco: Never Used  . Alcohol use 0.0 oz/week     Comment: daily wine, occasional vodka  . Drug use: Unknown  . Sexual activity: Not on file   Other Topics Concern  . 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  HENT: Negative.   Eyes: Negative for blurred vision.  Respiratory: as per HPI  Cardiovascular: Negative for chest pain and palpitations.  Gastrointestinal: Negative for vomiting, diarrhea, blood per rectum. Genitourinary: Negative for dysuria, urgency, frequency and hematuria.  Musculoskeletal: Negative for myalgias, back pain and joint pain.  Skin: Negative for itching and rash.  Neurological: Negative for dizziness, tremors, focal weakness, seizures and loss of consciousness.  Endo/Heme/Allergies: Negative for environmental allergies.  Psychiatric/Behavioral: Negative for depression, suicidal ideas and hallucinations.  All other systems reviewed and are negative.  Physical Exam: Blood pressure 132/80, pulse (!) 58, height 6\' 1"   (1.854 m), weight 212 lb 12.8 oz (96.5 kg), SpO2 99 %. Gen:      No acute distress HEENT:  EOMI, sclera anicteric Neck:     No masses; no thyromegaly Lungs:    Clear to auscultation bilaterally; normal respiratory effort CV:         Regular rate and rhythm; no murmurs Abd:      + bowel sounds; soft, non-tender; no palpable masses, no distension Ext:    No edema; adequate peripheral perfusion Skin:      Warm and dry; no rash Neuro: alert and oriented x 3 Psych: normal mood and affect  Data Reviewed: PFTs 05/23/15 FVC 3.59 (78%) FEV1 1.08 (30%) F/F 38 Severe obstructive lung disease.  A1AT levels 12/20/15- 109, PIMM phenotype  No recent CXR   Assessment:  COPD GOLD B. [name] [name] has been stable since his last acute exacerbation in December 2016. He feels that the [name] [name] is helping with her symptoms and he will continue on the same. I don't have a recent chest x-ray on record and will get it today to get a baseline assessment.  He is interested in participating in our research study for COPD. We will get him consented and controlled.  Plan/Recommendations: - Continue Dulera - Chest X ray - Enrolment in [employer] COPD study  [name] [name] [name] [name] [facility] Pager [phone] 07/03/2016, 10:53 AM  CC: Catha Gosselin, [name] [name]

## 2016-07-03 NOTE — Patient Instructions (Signed)
[facility] We'll get a chest x-ray for baseline evaluation of your lungs Since you are interested in joining the COPD study we will start the assessment to get you consented  Return to clinic in 6 months.

## 2016-07-14 NOTE — Progress Notes (Signed)
Left message for patient to call back for medical results.

## 2016-07-15 ENCOUNTER — Telehealth: Payer: Self-pay | Admitting: Pulmonary Disease

## 2016-07-15 NOTE — Telephone Encounter (Signed)
Spoke with pt. And informed him of his cxr results per PM. Pt. Had no further questions. Nothing further is needed    Notes Recorded by Chilton GreathousePraveen Mannam, MD on 07/03/2016 at 5:46 PM EST Please let the patient know that the CXR does not show any active disease. There are chronic findings of COPD.

## 2016-07-29 DIAGNOSIS — J449 Chronic obstructive pulmonary disease, unspecified: Secondary | ICD-10-CM | POA: Diagnosis not present

## 2016-07-29 DIAGNOSIS — N183 Chronic kidney disease, stage 3 (moderate): Secondary | ICD-10-CM | POA: Diagnosis not present

## 2016-07-29 DIAGNOSIS — N400 Enlarged prostate without lower urinary tract symptoms: Secondary | ICD-10-CM | POA: Diagnosis not present

## 2016-07-29 DIAGNOSIS — Z789 Other specified health status: Secondary | ICD-10-CM | POA: Diagnosis not present

## 2016-07-29 DIAGNOSIS — R7301 Impaired fasting glucose: Secondary | ICD-10-CM | POA: Diagnosis not present

## 2016-07-29 DIAGNOSIS — I10 Essential (primary) hypertension: Secondary | ICD-10-CM | POA: Diagnosis not present

## 2016-07-29 DIAGNOSIS — Z0000 Encounter for general adult medical examination without abnormal findings: Secondary | ICD-10-CM | POA: Diagnosis not present

## 2016-08-21 DIAGNOSIS — H2513 Age-related nuclear cataract, bilateral: Secondary | ICD-10-CM | POA: Diagnosis not present

## 2017-01-13 ENCOUNTER — Encounter: Payer: Self-pay | Admitting: Pulmonary Disease

## 2017-01-13 ENCOUNTER — Ambulatory Visit (INDEPENDENT_AMBULATORY_CARE_PROVIDER_SITE_OTHER): Payer: Medicare Other | Admitting: Pulmonary Disease

## 2017-01-13 VITALS — BP 124/78 | HR 68 | Ht 73.0 in | Wt 211.8 lb

## 2017-01-13 DIAGNOSIS — J449 Chronic obstructive pulmonary disease, unspecified: Secondary | ICD-10-CM | POA: Diagnosis not present

## 2017-01-13 NOTE — Patient Instructions (Signed)
Continue using Dulera as prescribed You can use Mucinex twice daily over-the-counter for chest congestion We will check your oxygen levels on exertion. Follow-up in 6 months.

## 2017-01-13 NOTE — Progress Notes (Addendum)
Dictation #1 [id number]  [id number]               [name] [name]    [id number]    May 11, 1941  Primary Care Physician:Little, Caryn Bee, MD  Referring Physician: Catha Gosselin, MD [address] [place], [place] [address]  Chief complaint:   Follow up for COPD GOLD B  HPI: [name] [name] is a 76 year old with past medical history of COPD (CAT score 10, O exacerbations over past year, FEV1 30%), hypertension, heavy smoking in the past.  He was seen by Dr. Clarene Duke at the primary care office in Dec 2016 for an exacerbation and given a prednisone taper starting at 60 mg for 12 days and started on Dulera inhaler. He reports that his symptoms have improved and feels close to his baseline.  Interim History: He continues on the [facility] inhaler. He is has not had any more exacerbations since 2016. He has some dyspnea on exertion. He reports slightly worsened cough with chest congestion. Denies any mucus production, fevers, chills, wheezing.  Outpatient Encounter Prescriptions as of 01/13/2017  Medication Sig  . amLODipine (NORVASC) 2.5 MG tablet Take 2.5 mg by mouth daily.  . DULERA 200-5 MCG/ACT AERO INHALE 2 PUFFS INTO THE LUNGS 2 (TWO) TIMES DAILY.  [name] losartan (COZAAR) 100 MG tablet Take 100 mg by mouth daily.  [name] trimethoprim (TRIMPEX) 100 MG tablet Take 100 mg by mouth daily.   No facility-administered encounter medications on file as of 01/13/2017.     Allergies as of 01/13/2017 - Review Complete 01/13/2017  Allergen Reaction Noted  . Nsaids  06/14/2015    Past Medical History:  Diagnosis Date  . Colon polyp March 2010   colonoscopy with Dr Randa Evens  . Elevated fasting glucose   . History of BPH    with obstruction and hydronephrosis and slihtly elevated creatinine, using self catherization, followed by urologist Dr McDiarmid  . History of ETT 2012   [name]  . History of tobacco use    54 pack yr  . HTN (hypertension)   . Paroxysmal supraventricular tachycardia (HCC)   .  Screening for AAA (abdominal aortic aneurysm) 3/16   3cm, nl/early aneurysm, repeat 3 yrs with u/s 3/19    Past Surgical History:  Procedure Laterality Date  . APPENDECTOMY    . REPLACEMENT TOTAL KNEE BILATERAL  [id number]  . TRANSURETHRAL RESECTION OF PROSTATE  2010    Family History  Problem Relation Age of Onset  . Kidney disease Father        ESRD  . Healthy Brother   . Healthy Brother   . Colon cancer Neg Hx   . Colon polyps Neg Hx     Social History   Social History  . Marital status: Married    Spouse name: N/A  . Number of children: N/A  . Years of education: N/A   Occupational History  . Not on file.   Social History Main Topics  . Smoking status: Former Smoker    Packs/day: 3.00    Years: 30.00    Types: Cigarettes    Quit date: 06/02/1985  . Smokeless tobacco: Never Used  . Alcohol use 0.0 oz/week     Comment: daily wine, occasional vodka  . Drug use: Unknown  . Sexual activity: Not on file   Other Topics Concern  . 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  HENT: Negative.   Eyes: Negative for blurred vision.  Respiratory: as per HPI  Cardiovascular: Negative for chest pain and palpitations.  Gastrointestinal: Negative for vomiting, diarrhea, blood per rectum. Genitourinary: Negative for dysuria, urgency, frequency and hematuria.  Musculoskeletal: Negative for myalgias, back pain and joint pain.  Skin: Negative for itching and rash.  Neurological: Negative for dizziness, tremors, focal weakness, seizures and loss of consciousness.  Endo/Heme/Allergies: Negative for environmental allergies.  Psychiatric/Behavioral: Negative for depression, suicidal ideas and hallucinations.  All other systems reviewed and are  negative.  Physical Exam: Blood pressure 124/78, pulse 68, height 6\' 1"  (1.854 m), weight 211 lb 12.8 oz (96.1 kg), SpO2 96 %. Gen:      No acute distress HEENT:  EOMI, sclera anicteric Neck:     No masses; no thyromegaly Lungs:    Clear to auscultation bilaterally; normal respiratory effort CV:         Regular rate and rhythm; no murmurs Abd:      + bowel sounds; soft, non-tender; no palpable masses, no distension Ext:    No edema; adequate peripheral perfusion Skin:      Warm and dry; no rash Neuro: alert and oriented x 3 Psych: normal mood and affect  Data Reviewed: PFTs 05/23/15 FVC 3.59 (78%) FEV1 1.08 (30%) F/F 38 Severe obstructive lung disease.  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 [name] [name] has been stable since his last acute exacerbation in December 2016. He feels that the [facility]ulera is helping with her symptoms and he will continue on the same. Chest x-ray reviewed which does not show any acute abnormality. He did not desat on exertion today  I asked him to start taking Mucinex twice daily for chest congestion.   Plan/Recommendations: - Continue Dulera - Mucinex for chest congestion - Check O2 levels on exertion.  [name] GreathousePraveen Grizelda Piscopo MD [facility] Pager [phone] 01/13/2017, 3:08 PM  CC: Catha GosselinLittle, [name], MD  Addendum: Received note from pharmacy 04/22/17 Patient is [facility]ad[facility].  Last refill was on 02/21/17. We will need to discuss at next office visit.  [name] GreathousePraveen Susette Seminara MD [facility] Pager [phone] If no answer or after 3pm call: [phone] 05/20/2017, 4:49 PM

## 2017-01-27 DIAGNOSIS — Z789 Other specified health status: Secondary | ICD-10-CM | POA: Diagnosis not present

## 2017-01-27 DIAGNOSIS — D699 Hemorrhagic condition, unspecified: Secondary | ICD-10-CM | POA: Diagnosis not present

## 2017-01-27 DIAGNOSIS — N183 Chronic kidney disease, stage 3 (moderate): Secondary | ICD-10-CM | POA: Diagnosis not present

## 2017-01-27 DIAGNOSIS — R7301 Impaired fasting glucose: Secondary | ICD-10-CM | POA: Diagnosis not present

## 2017-01-27 DIAGNOSIS — J449 Chronic obstructive pulmonary disease, unspecified: Secondary | ICD-10-CM | POA: Diagnosis not present

## 2017-01-27 DIAGNOSIS — I10 Essential (primary) hypertension: Secondary | ICD-10-CM | POA: Diagnosis not present

## 2017-03-05 DIAGNOSIS — N400 Enlarged prostate without lower urinary tract symptoms: Secondary | ICD-10-CM | POA: Diagnosis not present

## 2017-03-05 DIAGNOSIS — R339 Retention of urine, unspecified: Secondary | ICD-10-CM | POA: Diagnosis not present

## 2017-03-05 DIAGNOSIS — N1330 Unspecified hydronephrosis: Secondary | ICD-10-CM | POA: Diagnosis not present

## 2017-07-13 ENCOUNTER — Ambulatory Visit (INDEPENDENT_AMBULATORY_CARE_PROVIDER_SITE_OTHER): Payer: Medicare Other | Admitting: Pulmonary Disease

## 2017-07-13 ENCOUNTER — Encounter: Payer: Self-pay | Admitting: Pulmonary Disease

## 2017-07-13 ENCOUNTER — Ambulatory Visit (INDEPENDENT_AMBULATORY_CARE_PROVIDER_SITE_OTHER)
Admission: RE | Admit: 2017-07-13 | Discharge: 2017-07-13 | Disposition: A | Discharge status: Home / Self Care | Payer: Medicare Other | Source: Ambulatory Visit | Attending: Pulmonary Disease | Admitting: Pulmonary Disease

## 2017-07-13 ENCOUNTER — Other Ambulatory Visit: Payer: Medicare Other

## 2017-07-13 VITALS — BP 132/78 | HR 57 | Ht 73.0 in | Wt 202.6 lb

## 2017-07-13 DIAGNOSIS — R05 Cough: Secondary | ICD-10-CM

## 2017-07-13 DIAGNOSIS — J449 Chronic obstructive pulmonary disease, unspecified: Secondary | ICD-10-CM | POA: Diagnosis not present

## 2017-07-13 DIAGNOSIS — R059 Cough, unspecified: Secondary | ICD-10-CM

## 2017-07-13 LAB — CBC WITH DIFFERENTIAL/PLATELET
BASOS ABS: 49 {cells}/uL (ref 0–200)
Basophils Relative: 0.5 %
EOS ABS: 107 {cells}/uL (ref 15–500)
EOS PCT: 1.1 %
HCT: 42.2 % (ref 38.5–50.0)
Hemoglobin: 14.2 g/dL (ref 13.2–17.1)
Lymphs Abs: 4957 cells/uL — ABNORMAL HIGH (ref 850–3900)
MCH: 29 pg (ref 27.0–33.0)
MCHC: 33.6 g/dL (ref 32.0–36.0)
MCV: 86.3 fL (ref 80.0–100.0)
MONOS PCT: 6.5 %
MPV: 11.3 fL (ref 7.5–12.5)
NEUTROS PCT: 40.8 %
Neutro Abs: 3958 cells/uL (ref 1500–7800)
PLATELETS: 200 10*3/uL (ref 140–400)
RBC: 4.89 10*6/uL (ref 4.20–5.80)
RDW: 12.6 % (ref 11.0–15.0)
TOTAL LYMPHOCYTE: 51.1 %
WBC mixed population: 631 cells/uL (ref 200–950)
WBC: 9.7 10*3/uL (ref 3.8–10.8)

## 2017-07-13 MED ORDER — UMECLIDINIUM-VILANTEROL 62.5-25 MCG/INH IN AEPB: 1.0000 | INHALATION_SPRAY | Freq: Every day | RESPIRATORY_TRACT | 0 refills | Status: AC

## 2017-07-13 MED ORDER — UMECLIDINIUM-VILANTEROL 62.5-25 MCG/INH IN AEPB: 1.0000 | INHALATION_SPRAY | Freq: Every day | RESPIRATORY_TRACT | 5 refills | Status: DC

## 2017-07-13 NOTE — Progress Notes (Signed)
Dictation #1 [id number]  [id number]               [name] PilgrimLarry C [name]    [id number]    08/16/1940  Primary Care Physician:[name], [name] BeeKevin, MD  Referring Physician: Catha GosselinLittle, [name], MD [address] [place], [place] [address]  Chief complaint:   Follow up for COPD GOLD B  HPI: [name] [name] is a 77 year old with past medical history of COPD (CAT score 15, 0 exacerbations over past year, FEV1 30%), hypertension, heavy smoking in the past.  He was seen by Dr. Clarene DukeLittle at the primary care office in Dec 2016 for an exacerbation and given a prednisone taper starting at 60 mg for 12 days and started on Dulera inhaler. He reports that his symptoms have improved and feels close to his baseline.  Interim History: Reports worsening cough.  This is nonproductive in nature.  Denies any sputum production, wheezing, dyspnea fevers, chills. [facility] directed twice a day with no interruptions.  Outpatient Encounter Medications as of 07/13/2017  Medication Sig  . amLODipine (NORVASC) 2.5 MG tablet Take 2.5 mg by mouth daily.  . DULERA 200-5 MCG/ACT AERO INHALE 2 PUFFS INTO THE LUNGS 2 (TWO) TIMES DAILY.  [name]. losartan (COZAAR) 100 MG tablet Take 100 mg by mouth daily.  [name]. trimethoprim (TRIMPEX) 100 MG tablet Take 100 mg by mouth daily.   No facility-administered encounter medications on file as of 07/13/2017.     Allergies as of 07/13/2017 - Review Complete 07/13/2017  Allergen Reaction Noted  . Nsaids  06/14/2015    Past Medical History:  Diagnosis Date  . Colon polyp March 2010   colonoscopy with Dr Randa EvensEdwards  . Elevated fasting glucose   . History of BPH    with obstruction and hydronephrosis and slihtly elevated creatinine, using self catherization, followed by urologist Dr McDiarmid  . History of ETT 2012   [name]  . History of tobacco use    54 pack yr  . HTN (hypertension)   . Paroxysmal supraventricular tachycardia (HCC)   . Screening for AAA (abdominal aortic  aneurysm) 3/16   3cm, nl/early aneurysm, repeat 3 yrs with u/s 3/19    Past Surgical History:  Procedure Laterality Date  . APPENDECTOMY    . REPLACEMENT TOTAL KNEE BILATERAL  [id number]  . TRANSURETHRAL RESECTION OF PROSTATE  2010    Family History  Problem Relation Age of Onset  . Kidney disease Father        ESRD  . Healthy Brother   . Healthy Brother   . Colon cancer Neg Hx   . Colon polyps Neg Hx     Social History   Socioeconomic History  . Marital status: Married    Spouse name: Not on file  . Number of children: Not on file  . Years of education: Not on file  . Highest education level: Not on file  Social Needs  . Financial resource strain: Not on file  . Food insecurity - worry: Not on file  . Food insecurity - inability: Not on file  . Transportation needs - medical: Not on file  . Transportation needs - non-medical: Not on file  Occupational History  . Not on file  Tobacco Use  . Smoking status: Former Smoker    Packs/day: 3.00    Years: 30.00    Pack years: 90.00    Types: Cigarettes    Last attempt to quit: 06/02/1985    Years since quitting: 32.1  .  Smokeless tobacco: Never Used  Substance and Sexual Activity  . Alcohol use: Yes    Alcohol/week: 0.0 oz    Comment: daily wine, occasional vodka  . Drug use: Not on file  . Sexual activity: Not on file  Other Topics Concern  . Not on file  Social History Narrative   Caffeine: yes   No diet   Exercise: yes   Married to [place]   2 children   Retired - Education officer, environmental, with extensive traveling   Review of systems: Review of Systems  Constitutional: Negative for fever and chills.  HENT: Negative.   Eyes: Negative for blurred vision.  Respiratory: as per HPI  Cardiovascular: Negative for chest pain and palpitations.  Gastrointestinal: Negative for vomiting, diarrhea, blood per rectum. Genitourinary: Negative for dysuria, urgency, frequency and hematuria.  Musculoskeletal: Negative for  myalgias, back pain and joint pain.  Skin: Negative for itching and rash.  Neurological: Negative for dizziness, tremors, focal weakness, seizures and loss of consciousness.  Endo/Heme/Allergies: Negative for environmental allergies.  Psychiatric/Behavioral: Negative for depression, suicidal ideas and hallucinations.  All other systems reviewed and are negative.  Physical Exam:   Data Reviewed: PFTs 05/23/15 FVC 3.59 (78%), FEV1 1.08 (30%), F/F 38 Severe obstructive lung disease.  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 Has mild worsening of cough.  We will stop the [facility] and start him on anoro. Suspicion for asthma is low Reevaluate with chest x-ray, PFTs, CBC differential, IgE.  Continue Mucinex for chest congestion  Plan/Recommendations: - Stop Dulera.  Start Anoro - CBC differential, chest x-ray PFTs, allergy - Mucinex for chest congestion  [name] Greathouse MD [facility] Pager [phone] 07/13/2017, 9:10 AM  CC: Catha Gosselin, MD

## 2017-07-13 NOTE — Patient Instructions (Signed)
We will get a CBC differential, x-ray and pulmonary function test We will stop the Symbicort and start you on anoro Follow-up in 1-2 months.

## 2017-07-15 DIAGNOSIS — R3121 Asymptomatic microscopic hematuria: Secondary | ICD-10-CM | POA: Diagnosis not present

## 2017-07-15 DIAGNOSIS — R339 Retention of urine, unspecified: Secondary | ICD-10-CM | POA: Diagnosis not present

## 2017-07-15 DIAGNOSIS — N400 Enlarged prostate without lower urinary tract symptoms: Secondary | ICD-10-CM | POA: Diagnosis not present

## 2017-07-15 DIAGNOSIS — N281 Cyst of kidney, acquired: Secondary | ICD-10-CM | POA: Diagnosis not present

## 2017-07-24 DIAGNOSIS — N3020 Other chronic cystitis without hematuria: Secondary | ICD-10-CM | POA: Diagnosis not present

## 2017-07-24 DIAGNOSIS — N281 Cyst of kidney, acquired: Secondary | ICD-10-CM | POA: Diagnosis not present

## 2017-07-29 DIAGNOSIS — R3914 Feeling of incomplete bladder emptying: Secondary | ICD-10-CM | POA: Diagnosis not present

## 2017-07-29 DIAGNOSIS — N400 Enlarged prostate without lower urinary tract symptoms: Secondary | ICD-10-CM | POA: Diagnosis not present

## 2017-07-29 DIAGNOSIS — N3020 Other chronic cystitis without hematuria: Secondary | ICD-10-CM | POA: Diagnosis not present

## 2017-07-29 DIAGNOSIS — N139 Obstructive and reflux uropathy, unspecified: Secondary | ICD-10-CM | POA: Diagnosis not present

## 2017-08-11 ENCOUNTER — Ambulatory Visit (INDEPENDENT_AMBULATORY_CARE_PROVIDER_SITE_OTHER): Payer: Medicare Other | Admitting: Internal Medicine

## 2017-08-11 ENCOUNTER — Encounter: Payer: Self-pay | Admitting: Internal Medicine

## 2017-08-11 ENCOUNTER — Other Ambulatory Visit (INDEPENDENT_AMBULATORY_CARE_PROVIDER_SITE_OTHER): Payer: Medicare Other

## 2017-08-11 VITALS — BP 132/84 | HR 65 | Ht 72.0 in | Wt 210.0 lb

## 2017-08-11 DIAGNOSIS — R05 Cough: Secondary | ICD-10-CM

## 2017-08-11 DIAGNOSIS — I10 Essential (primary) hypertension: Secondary | ICD-10-CM | POA: Diagnosis not present

## 2017-08-11 DIAGNOSIS — J449 Chronic obstructive pulmonary disease, unspecified: Secondary | ICD-10-CM

## 2017-08-11 DIAGNOSIS — R058 Other specified cough: Secondary | ICD-10-CM | POA: Insufficient documentation

## 2017-08-11 LAB — CBC WITH DIFFERENTIAL/PLATELET
BASOS ABS: 0.1 10*3/uL (ref 0.0–0.1)
Basophils Relative: 1.1 % (ref 0.0–3.0)
EOS ABS: 0.2 10*3/uL (ref 0.0–0.7)
EOS PCT: 1.7 % (ref 0.0–5.0)
HCT: 42.2 % (ref 39.0–52.0)
HEMOGLOBIN: 14.1 g/dL (ref 13.0–17.0)
LYMPHS ABS: 5.5 10*3/uL — AB (ref 0.7–4.0)
Lymphocytes Relative: 51.3 % — ABNORMAL HIGH (ref 12.0–46.0)
MCHC: 33.5 g/dL (ref 30.0–36.0)
MCV: 87.7 fl (ref 78.0–100.0)
MONO ABS: 0.7 10*3/uL (ref 0.1–1.0)
Monocytes Relative: 6.1 % (ref 3.0–12.0)
NEUTROS PCT: 39.8 % — AB (ref 43.0–77.0)
Neutro Abs: 4.3 10*3/uL (ref 1.4–7.7)
Platelets: 210 10*3/uL (ref 150.0–400.0)
RBC: 4.81 Mil/uL (ref 4.22–5.81)
RDW: 13.6 % (ref 11.5–15.5)
WBC: 10.7 10*3/uL — ABNORMAL HIGH (ref 4.0–10.5)

## 2017-08-11 LAB — PULMONARY FUNCTION TEST
DL/VA % pred: 62 %
DL/VA: 2.92 ml/min/mmHg/L
DLCO UNC: 24.52 ml/min/mmHg
DLCO cor % pred: 70 %
DLCO cor: 24.81 ml/min/mmHg
DLCO unc % pred: 70 %
FEF 25-75 POST: 1.76 L/s
FEF 25-75 PRE: 1.32 L/s
FEF2575-%Change-Post: 32 %
FEF2575-%PRED-POST: 76 %
FEF2575-%Pred-Pre: 57 %
FEV1-%Change-Post: 13 %
FEV1-%PRED-POST: 90 %
FEV1-%Pred-Pre: 79 %
FEV1-POST: 2.93 L
FEV1-PRE: 2.57 L
FEV1FVC-%Change-Post: 9 %
FEV1FVC-%PRED-PRE: 79 %
FEV6-%CHANGE-POST: 3 %
FEV6-%PRED-POST: 109 %
FEV6-%Pred-Pre: 105 %
FEV6-Post: 4.59 L
FEV6-Pre: 4.43 L
FEV6FVC-%CHANGE-POST: 0 %
FEV6FVC-%PRED-PRE: 104 %
FEV6FVC-%Pred-Post: 103 %
FVC-%Change-Post: 4 %
FVC-%Pred-Post: 105 %
FVC-%Pred-Pre: 100 %
FVC-Post: 4.72 L
FVC-Pre: 4.52 L
POST FEV1/FVC RATIO: 62 %
Post FEV6/FVC ratio: 97 %
Pre FEV1/FVC ratio: 57 %
Pre FEV6/FVC Ratio: 98 %
RV % PRED: 205 %
RV: 5.57 L
TLC % PRED: 142 %
TLC: 10.61 L

## 2017-08-11 MED ORDER — MOMETASONE FURO-FORMOTEROL FUM 100-5 MCG/ACT IN AERO: INHALATION_SPRAY | RESPIRATORY_TRACT | 11 refills | Status: DC

## 2017-08-11 NOTE — Patient Instructions (Addendum)
Try dulera 100  Take 2 puffs first thing in am and then another 2 puffs about 12 hours later.   Work on inhaler technique:  relax and gently blow all the way out then take a nice smooth deep breath back in, triggering the inhaler at same time you start breathing in.  Hold for up to 5 seconds if you can. Blow out thru nose. Rinse and gargle with water when done     Stop anoro  If not better first step is to try a different blood pressure medication other than losartan   Please remember to go to the lab department downstairs in the basement  for your tests - we will call you with the results when they are available.      Please schedule a follow up visit in 3 months but call sooner if needed with Dr Isaiah SergeMannam

## 2017-08-11 NOTE — Assessment & Plan Note (Signed)
PFT's  08/11/2017  FEV1 2.93 (90 % ) ratio 6  p 13 % improvement from saba p anoro prior to study with DLCO  70 % corrects to 62 % for alv volume   His main concern is the cough for now and sometimes more is less in this setting so rec try dulera 100 2bid and f/u with Dr Isaiah SergeMannam  - The proper method of use, as well as anticipated side effects, of a metered-dose inhaler are discussed and demonstrated to the patient.    I had an extended discussion with the patient reviewing all relevant studies completed to date and  lasting 15 to 20 minutes of a 25 minute visit  With pt new to me   Each maintenance medication was reviewed in detail including most importantly the difference between maintenance and prns and under what circumstances the prns are to be triggered using an action plan format that is not reflected in the computer generated alphabetically organized AVS.    Please see AVS for specific instructions unique to this visit that I personally wrote and verbalized to the the pt in detail and then reviewed with pt  by my nurse highlighting any  changes in therapy recommended at today's visit to their plan of care.

## 2017-08-11 NOTE — Assessment & Plan Note (Signed)
For reasons that may related to vascular permability and nitric oxide pathways but not elevated  bradykinin levels (as seen with  ACEi use) losartan in the generic form has been reported now from mulitple sources  to cause a similar pattern of non-specific  upper airway symptoms as seen with acei.   This has not been reported with exposure to the other ARB's to date, so iif not improving would considereither generic diovan or avapro if ARB needed or use an alternative class altogether.  See:  Dewayne HatchAnn Allergy Asthma Immunol  2008: 101: p [phone]

## 2017-08-11 NOTE — Assessment & Plan Note (Signed)
Onset fall 2018 while on dulera 200 2bid > changed to anoro 07/13/17 and no better so 08/11/2017  change to dulera 100 2bid   Upper airway cough syndrome (previously labeled PNDS),  is so named because it's frequently impossible to sort out how much is  CR/sinusitis with freq throat clearing (which can be related to primary GERD)   vs  causing  secondary (" extra esophageal")  GERD from wide swings in gastric pressure that occur with throat clearing, often  promoting self use of mint and menthol lozenges that reduce the lower esophageal sphincter tone and exacerbate the problem further in a cyclical fashion.   These are the same pts (now being labeled as having "irritable larynx syndrome" by some cough centers) who not infrequently have a history of having failed to tolerate ace inhibitors,  dry powder inhalers or high dose ics like dulera 200 or biphosphonates or report having atypical/extraesophageal reflux symptoms that don't respond to standard doses of PPI  and are easily confused as having aecopd or asthma flares by even experienced allergists/ pulmonologists (myself included).   Will first try to reduce the dulera to 100 2 q 12h and if not better consider alternative for losartan (see separate a/p)   Check allergy profile/ consider sinus CT as well if persists

## 2017-08-11 NOTE — Progress Notes (Signed)
PFT done today. 

## 2017-08-11 NOTE — Progress Notes (Signed)
Dictation #1 [id number]  [id number]               [name] [name]    [id number]    August 24, 1940  Primary Care Physician:Little, Caryn Bee, MD  Referring Physician: Catha Gosselin, MD [address] [place], [place] [address]  Chief complaint:   Follow up for COPD GOLD B  HPI: 74 yowm quit smoking 1887 with GOLD I criteria with reversible component as of pfts 08/11/2017   08/11/2017  f/u ov/[name] [name] re:  Cough x 6 months Chief Complaint  Patient presents with  . Follow-up    Cough and SOB are unchanged. PFT's done today. He states he does not have a rescue inhaler.   Dyspnea: up hills only = MMRC1 = can walk nl pace, flat grade, can't hurry or go uphills or steps s sob   Cough: daytime tickle esp with laughing/ non productive  Sleep: ok  SABA use: no rescue Cough is no better on anoro vs dulera 200 and no change in ex tol   No obvious day to day or daytime variability or assoc excess/ purulent sputum or mucus plugs or hemoptysis or cp or chest tightness, subjective wheeze or overt sinus or hb symptoms. No unusual exposure hx or h/o childhood pna/ asthma or knowledge of premature birth.  Sleeping ok flat without nocturnal  or early am exacerbation  of respiratory  c/o's or need for noct saba. Also denies any obvious fluctuation of symptoms with weather or environmental changes or other aggravating or alleviating factors except as outlined above   Current Allergies, Complete Past Medical History, Past Surgical History, Family History, and Social History were reviewed in [employer] record.  ROS  The following are not active complaints unless bolded Hoarseness, sore throat, dysphagia, dental problems, itching, sneezing,  nasal congestion or discharge of excess mucus or purulent secretions, ear ache,   fever, chills, sweats, unintended wt loss or wt gain, classically pleuritic or exertional cp,  orthopnea pnd or leg swelling, presyncope, palpitations, abdominal pain,  anorexia, nausea, vomiting, diarrhea  or change in bowel habits or change in bladder habits, change in stools or change in urine, dysuria, hematuria,  rash, arthralgias, visual complaints, headache, numbness, weakness or ataxia or problems with walking or coordination,  change in mood/affect or memory.        Current Meds  Medication Sig  . amLODipine (NORVASC) 2.5 MG tablet Take 2.5 mg by mouth daily.  [name] losartan (COZAAR) 100 MG tablet Take 100 mg by mouth daily.  [name] trimethoprim (TRIMPEX) 100 MG tablet Take 100 mg by mouth daily.  [name]   umeclidinium-vilanterol (ANORO ELLIPTA) 62.5-25 MCG/INH AEPB Inhale 1 puff into the lungs daily.            Physical Exam:  amb wm nad   Wt Readings from Last 3 Encounters:  08/11/17 210 lb (95.3 kg)  07/13/17 202 lb 9.6 oz (91.9 kg)  01/13/17 211 lb 12.8 oz (96.1 kg)     Vital signs reviewed - Note on arrival 02 sats 95% on RA       HEENT: nl dentition, turbinates bilaterally, and oropharynx. Nl external ear canals without cough reflex   NECK :  without JVD/Nodes/TM/ nl carotid upstrokes bilaterally   LUNGS: no acc muscle use,  Nl contour chest which is clear to A and P bilaterally without cough on insp or exp maneuvers   CV:  RRR  no s3 or murmur or increase in P2, and no edema  ABD:  soft and nontender with nl inspiratory excursion in the supine position. No bruits or organomegaly appreciated, bowel sounds nl  MS:  Nl gait/ ext warm without deformities, calf tenderness, cyanosis or clubbing No obvious joint restrictions   SKIN: warm and dry without lesions    NEURO:  alert, approp, nl sensorium with  no motor or cerebellar deficits apparent.      Studies reviewed 08/11/2017  A1AT levels 12/20/15- 109, PIMM phenotype

## 2017-08-12 LAB — RESPIRATORY ALLERGY PROFILE REGION II ~~LOC~~
Allergen, A. alternata, m6: <0.1 kU/L
Allergen, Comm Silver Birch, t9: <0.1 kU/L
Allergen, Mouse Urine Protein, e78: <0.1 kU/L
Allergen, P. notatum, m1: <0.1 kU/L
Aspergillus fumigatus, m3: <0.1 kU/L
CLADOSPORIUM HERBARUM (M2) IGE: <0.1 kU/L
CLASS: 0
CLASS: 0
CLASS: 0
CLASS: 0
CLASS: 0
CLASS: 0
CLASS: 0
CLASS: 0
CLASS: 0
CLASS: 0
COMMON RAGWEED (SHORT) (W1) IGE: <0.1 kU/L
Class: 0
Class: 0
Class: 0
Class: 0
Class: 0
Class: 0
Class: 0
Class: 0
Class: 0
Class: 0
Class: 0
Class: 0
Class: 0
Class: 0
Cockroach: <0.1 kU/L
D. farinae: <0.1 kU/L
Elm IgE: <0.1 kU/L
IgE (Immunoglobulin E), Serum: 154 kU/L — ABNORMAL HIGH (ref <=114)
Johnson Grass: <0.1 kU/L
Pecan/Hickory Tree IgE: <0.1 kU/L

## 2017-08-12 LAB — INTERPRETATION:

## 2017-08-13 NOTE — Progress Notes (Signed)
LMTCB

## 2017-08-19 DIAGNOSIS — N183 Chronic kidney disease, stage 3 (moderate): Secondary | ICD-10-CM | POA: Diagnosis not present

## 2017-08-19 DIAGNOSIS — J449 Chronic obstructive pulmonary disease, unspecified: Secondary | ICD-10-CM | POA: Diagnosis not present

## 2017-08-19 DIAGNOSIS — I10 Essential (primary) hypertension: Secondary | ICD-10-CM | POA: Diagnosis not present

## 2017-08-19 DIAGNOSIS — N400 Enlarged prostate without lower urinary tract symptoms: Secondary | ICD-10-CM | POA: Diagnosis not present

## 2017-08-19 DIAGNOSIS — Z789 Other specified health status: Secondary | ICD-10-CM | POA: Diagnosis not present

## 2017-08-19 DIAGNOSIS — I714 Abdominal aortic aneurysm, without rupture: Secondary | ICD-10-CM | POA: Diagnosis not present

## 2017-08-21 ENCOUNTER — Other Ambulatory Visit: Payer: Self-pay | Admitting: Family Medicine

## 2017-08-21 DIAGNOSIS — I714 Abdominal aortic aneurysm, without rupture, unspecified: Secondary | ICD-10-CM

## 2017-08-26 ENCOUNTER — Ambulatory Visit
Admission: RE | Admit: 2017-08-26 | Discharge: 2017-08-26 | Disposition: A | Discharge status: Home / Self Care | Payer: Medicare Other | Source: Ambulatory Visit | Attending: Family Medicine | Admitting: Family Medicine

## 2017-08-26 DIAGNOSIS — I714 Abdominal aortic aneurysm, without rupture, unspecified: Secondary | ICD-10-CM

## 2017-11-23 ENCOUNTER — Encounter: Payer: Self-pay | Admitting: Pulmonary Disease

## 2017-11-23 ENCOUNTER — Ambulatory Visit (INDEPENDENT_AMBULATORY_CARE_PROVIDER_SITE_OTHER): Payer: Medicare Other | Admitting: Pulmonary Disease

## 2017-11-23 VITALS — BP 132/74 | HR 65 | Ht 72.0 in | Wt 209.0 lb

## 2017-11-23 DIAGNOSIS — J449 Chronic obstructive pulmonary disease, unspecified: Secondary | ICD-10-CM

## 2017-11-23 NOTE — Patient Instructions (Signed)
I am glad you are doing well with your breathing.  [facility] as it appears to be working for you Continue to maintain an active lifestyle as you are doing now I will see you back in 6 months.  Please call us sooner if there is any change in his symptoms.

## 2017-11-23 NOTE — Progress Notes (Signed)
Inhaler training given. In-check peak flow #:65 

## 2017-11-23 NOTE — Progress Notes (Signed)
Dictation #1 [id number]  [id number]               [name] PilgrimLarry C [name]    [id number]    09/29/1940  Primary Care Physician:Via, [name] BeeKevin, [name]  Referring Physician: Catha [name], [name] [name] [name] [place], [place] [address]  Chief complaint:   Follow up for COPD GOLD B  HPI: [name] [name] is a 77 year old with past medical history of COPD (CAT score 15, 0 exacerbations over past year, FEV1 30%), hypertension, heavy smoking in the past.  He was seen by [name]. Clarene [name] at the primary care office in Dec 2016 for an exacerbation and given a prednisone taper starting at 60 mg for 12 days and started on Dulera inhaler. He reports that his symptoms have improved and feels close to his baseline.   Interim History: [facility].  He feels that anoro do not work as well and cause more so [facility] Has minimal symptoms of dyspnea on exertion.  Denies any cough except during pollen season.  No dyspnea, wheezing, mucus production, fevers, chills  He continues to be active and walks his dog a couple of hours every day and swims half an hour several times a week.  Outpatient Encounter Medications as of 11/23/2017  Medication Sig  . amLODipine (NORVASC) 2.5 MG tablet Take 2.5 mg by mouth daily.  [name]. losartan (COZAAR) 100 MG tablet Take 100 mg by mouth daily.  . mometasone-formoterol (DULERA) 100-5 MCG/ACT AERO Take 2 puffs first thing in am and then another 2 puffs about 12 hours later.  . trimethoprim (TRIMPEX) 100 MG tablet Take 100 mg by mouth daily.   No facility-administered encounter medications on file as of 11/23/2017.     Allergies as of 11/23/2017 - Review Complete 11/23/2017  Allergen Reaction Noted  . Nsaids  06/14/2015    Past Medical History:  Diagnosis Date  . Colon polyp March 2010   colonoscopy with [name] [name] EvensEdwards  . Elevated fasting glucose   . History of BPH    with obstruction and hydronephrosis and slihtly elevated creatinine, using self  catherization, followed by urologist [name] [name]  . History of ETT 2012   [name] [name] nml  . History of tobacco use    54 pack yr  . HTN (hypertension)   . Paroxysmal supraventricular tachycardia (HCC)   . Screening for AAA (abdominal aortic aneurysm) 3/16   3cm, nl/early aneurysm, repeat 3 yrs with u/s 3/19    Past Surgical History:  Procedure Laterality Date  . APPENDECTOMY    . REPLACEMENT TOTAL KNEE BILATERAL  [id number]  . TRANSURETHRAL RESECTION OF PROSTATE  2010    Family History  Problem Relation Age of Onset  . Kidney disease Father        ESRD  . Healthy Brother   . Healthy Brother   . Colon cancer Neg Hx   . Colon polyps Neg Hx     Social History   Socioeconomic History  . Marital status: Married    Spouse name: Not on file  . Number of children: Not on file  . Years of education: Not on file  . Highest education level: Not on file  Occupational History  . Not on file  Social Needs  . Financial resource strain: Not on file  . Food insecurity:    Worry: Not on file    Inability: Not on file  . Transportation needs:    Medical: Not on file  Non-medical: Not on file  Tobacco Use  . Smoking status: Former Smoker    Packs/day: 3.00    Years: 30.00    Pack years: 90.00    Types: Cigarettes    Last attempt to quit: 06/02/1985    Years since quitting: 32.4  . Smokeless tobacco: Never Used  Substance and Sexual Activity  . Alcohol use: Yes    Alcohol/week: 0.0 oz    Comment: daily wine, occasional vodka  . Drug use: Not on file  . Sexual activity: Not on file  Lifestyle  . Physical activity:    Days per week: Not on file    Minutes per session: Not on file  . Stress: Not on file  Relationships  . Social connections:    Talks on phone: Not on file    Gets together: Not on file    Attends religious service: Not on file    Active member of club or organization: Not on file    Attends meetings of clubs or organizations: Not on file    Relationship  status: Not on file  . Intimate partner violence:    Fear of current or ex partner: Not on file    Emotionally abused: Not on file    Physically abused: Not on file    Forced sexual activity: Not on file  Other Topics Concern  . Not on file  Social History Narrative   Caffeine: yes   No diet   Exercise: yes   Married to [place]   2 children   Retired - Education officer, environmental, with extensive traveling   Review of systems: Review of Systems  Constitutional: Negative for fever and chills.  HENT: Negative.   Eyes: Negative for blurred vision.  Respiratory: as per HPI  Cardiovascular: Negative for chest pain and palpitations.  Gastrointestinal: Negative for vomiting, diarrhea, blood per rectum. Genitourinary: Negative for dysuria, urgency, frequency and hematuria.  Musculoskeletal: Negative for myalgias, back pain and joint pain.  Skin: Negative for itching and rash.  Neurological: Negative for dizziness, tremors, focal weakness, seizures and loss of consciousness.  Endo/Heme/Allergies: Negative for environmental allergies.  Psychiatric/Behavioral: Negative for depression, suicidal ideas and hallucinations.  All other systems reviewed and are negative.  Physical Exam: Blood pressure 132/74, pulse 65, height 6' (1.829 m), weight 209 lb (94.8 kg), SpO2 96 %. Gen:      No acute distress HEENT:  EOMI, sclera anicteric Neck:     No masses; no thyromegaly Lungs:    Clear to auscultation bilaterally; normal respiratory effort CV:         Regular rate and rhythm; no murmurs Abd:      + bowel sounds; soft, non-tender; no palpable masses, no distension Ext:    No edema; adequate peripheral perfusion Skin:      Warm and dry; no rash Neuro: alert and oriented x 3 Psych: normal mood and affect  Data Reviewed: PFTs  05/23/15 FVC 3.59 (78%), FEV1 1.08 (30%), F/F 38 Severe obstructive lung disease.  08/11/2017 FVC 4.72 [105%], FEV1 2.92 [90%], F/F 62, TLC 142%, DLCO 70% Mild obstructive  airway disease with air trapping, hyperinflation, reversibility.  Labs A1AT levels 12/20/15- 109, PIMM phenotype CBC 08/11/2017-WBC 10.7, eos 1.7%, absolute eosinophil count 182 Blood allergy profile 08/11/2017-IgE 154, RAST panel negative  Imaging Chest x-ray 07/03/16-  hyperinflated lung, mild left base atelectasis/scarring Chest x-ray 07/13/2017- hyperinflated lungs CT abdomen pelvis 07/24/2017- mild scarring in the bases.  No evidence of interstitial lung disease. I have reviewed  the images personally.  Assessment:  COPD GOLD B, asthma Back to [facility] as he likes this better than Anoro.  Besides he cannot afford the cost of Anoro We will continue Elwin Sleight is doing well on this with minimal symptoms and good exercise capacity If his symptoms do worsen in the future then we had the option of switching to Trelegy  Pulmonary rehab discussed but deferred for now as he is active at home  Health maintenance 04/13/2017-influenza 08/19/2013-Prevnar 06/02/2013-Pneumovax  Plan/Recommendations: - Continue Dulera  Follow-up in 6 months.  Chilton Greathouse [name] [facility] 11/23/2017, 9:25 AM  CC: [name] Gosselin, [name]

## 2018-03-23 DIAGNOSIS — Z0000 Encounter for general adult medical examination without abnormal findings: Secondary | ICD-10-CM | POA: Diagnosis not present

## 2018-03-23 DIAGNOSIS — Z23 Encounter for immunization: Secondary | ICD-10-CM | POA: Diagnosis not present

## 2018-03-23 DIAGNOSIS — Z1389 Encounter for screening for other disorder: Secondary | ICD-10-CM | POA: Diagnosis not present

## 2018-03-23 DIAGNOSIS — N400 Enlarged prostate without lower urinary tract symptoms: Secondary | ICD-10-CM | POA: Diagnosis not present

## 2018-03-23 DIAGNOSIS — R7303 Prediabetes: Secondary | ICD-10-CM | POA: Diagnosis not present

## 2018-03-23 DIAGNOSIS — J449 Chronic obstructive pulmonary disease, unspecified: Secondary | ICD-10-CM | POA: Diagnosis not present

## 2018-03-23 DIAGNOSIS — N183 Chronic kidney disease, stage 3 (moderate): Secondary | ICD-10-CM | POA: Diagnosis not present

## 2018-03-23 DIAGNOSIS — I10 Essential (primary) hypertension: Secondary | ICD-10-CM | POA: Diagnosis not present

## 2018-07-14 DIAGNOSIS — R3914 Feeling of incomplete bladder emptying: Secondary | ICD-10-CM | POA: Diagnosis not present

## 2018-07-14 DIAGNOSIS — N1330 Unspecified hydronephrosis: Secondary | ICD-10-CM | POA: Diagnosis not present

## 2018-08-04 IMAGING — DX DG CHEST 2V
3 series · 3 of 3 positions shown · non-contrast
Comparison: PA and lateral chest x-ray July 03, 2016

CLINICAL DATA: Three months of cough. History of hypertension,
COPD, former smoker.

EXAM:
CHEST  2 VIEW

[chest pa (1 of 2)]
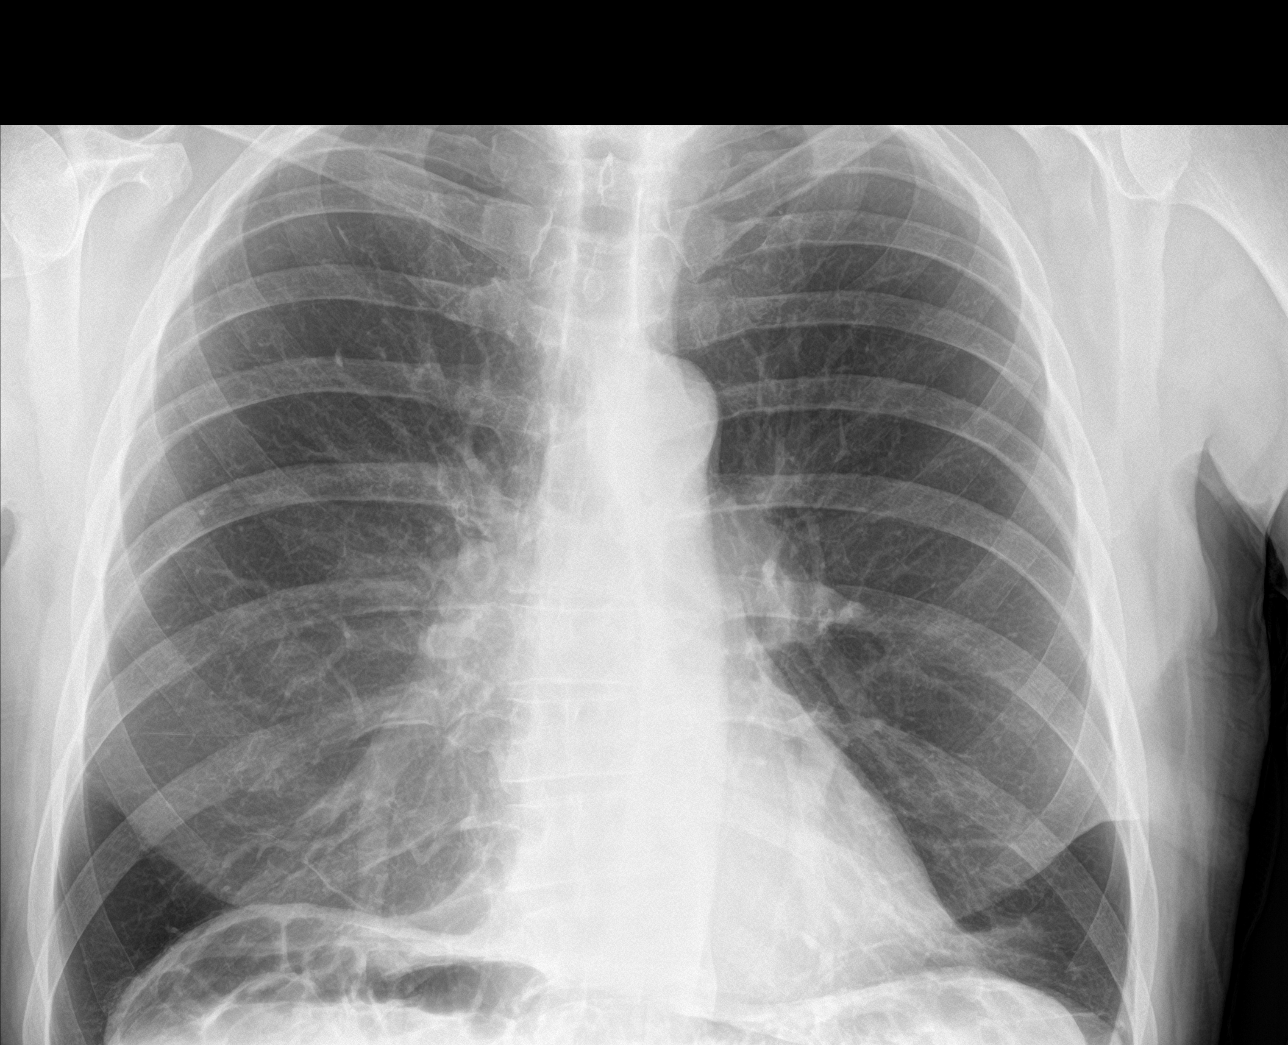

[chest lat]
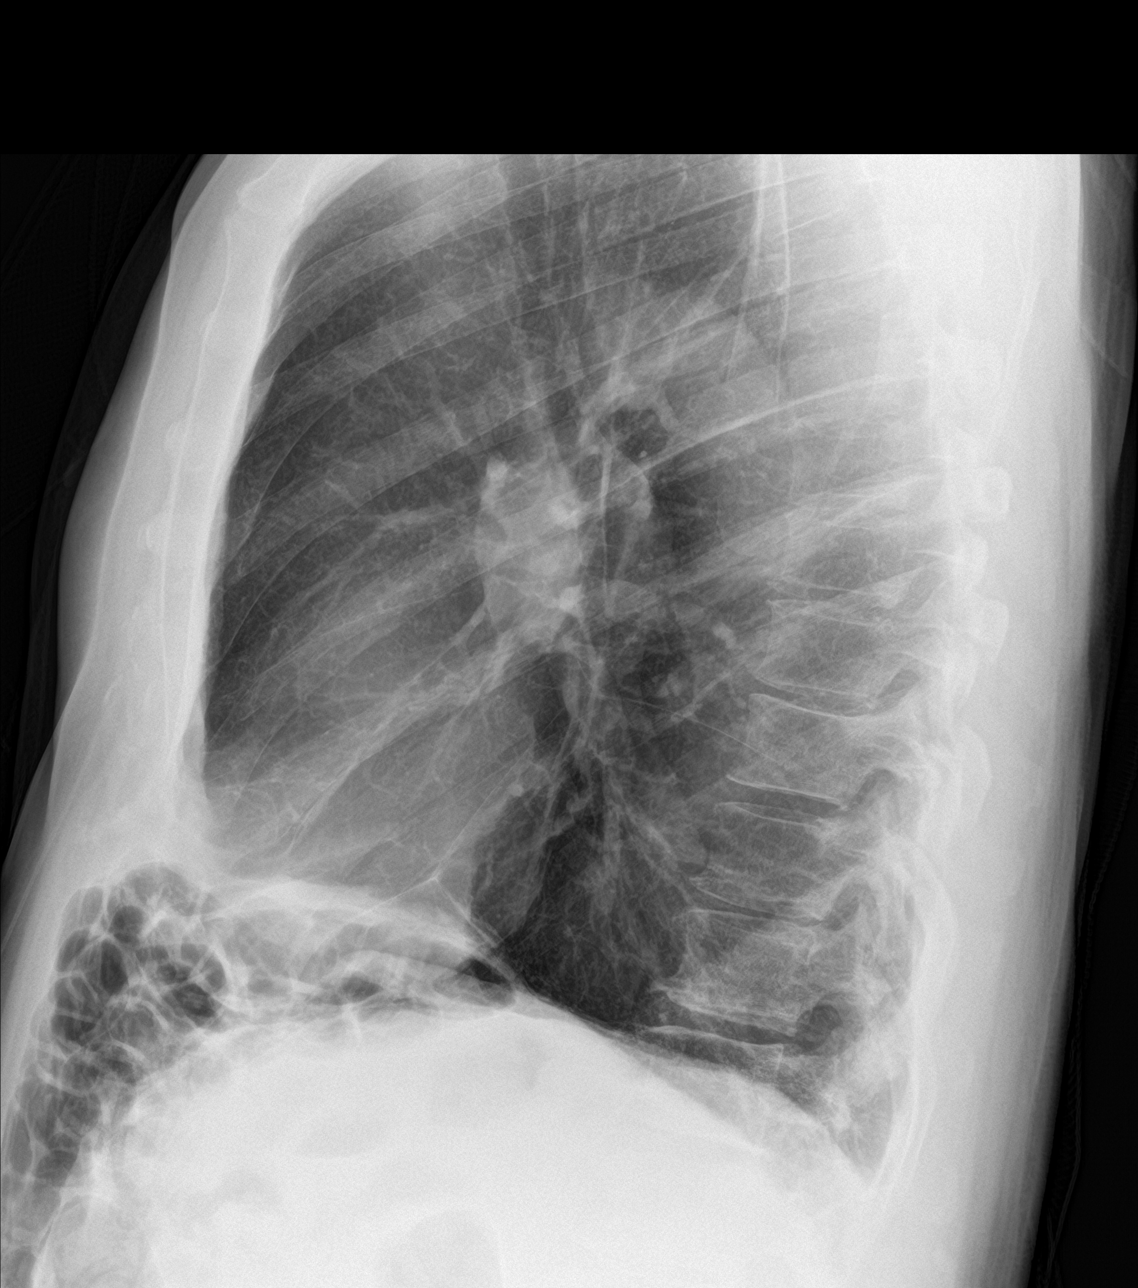

[chest pa (2 of 2)]
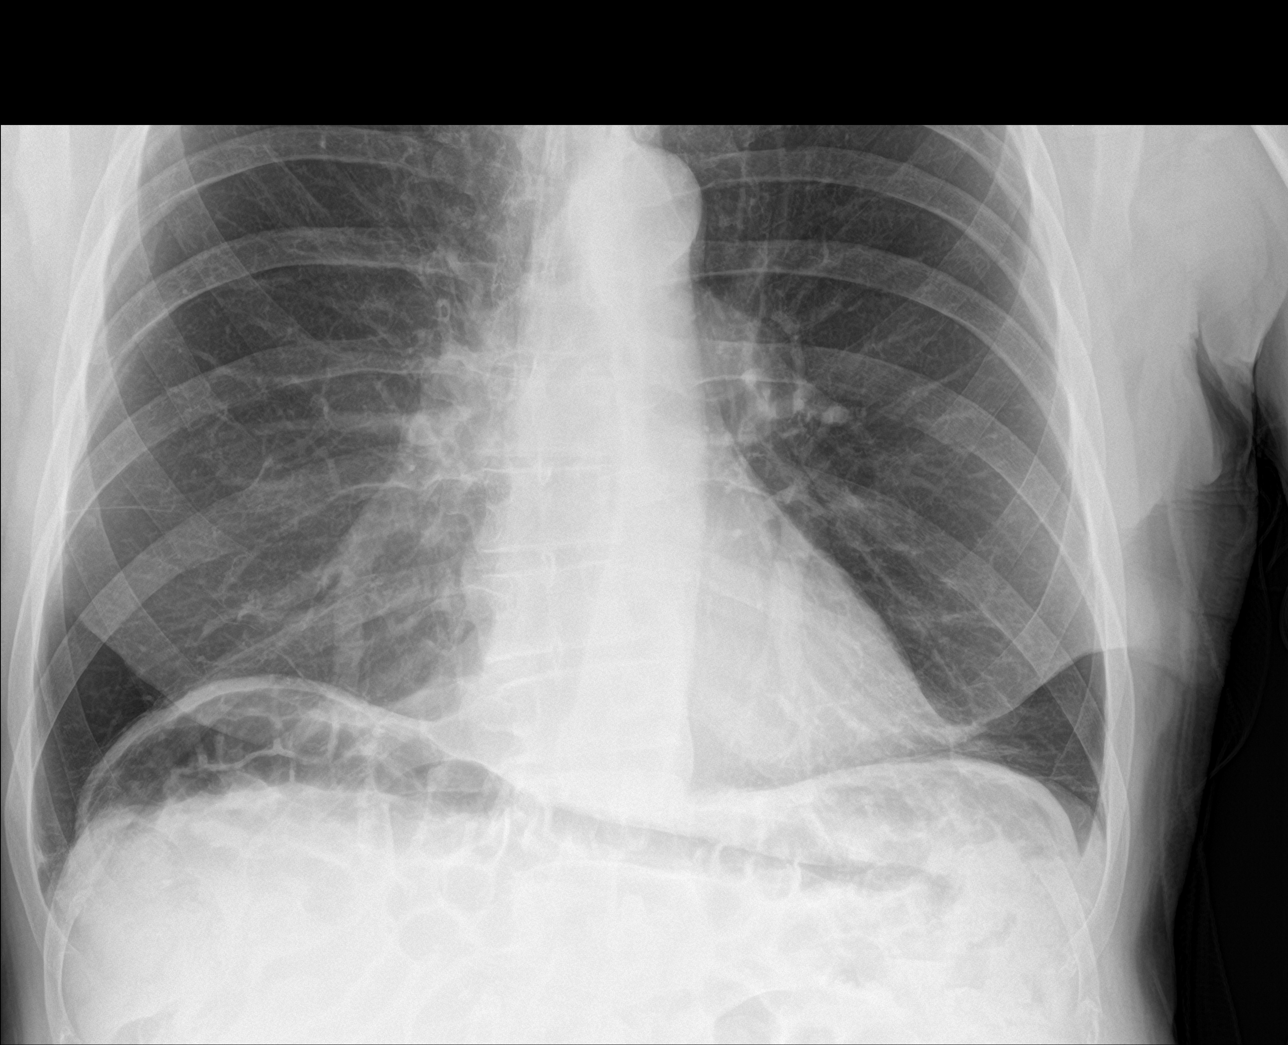

[3 of 3 positions shown; findings below may reference images not displayed]

FINDINGS: The lungs are hyperinflated with hemidiaphragm flattening. There is
no focal infiltrate. There is no pleural effusion or pneumothorax.
The heart and pulmonary vascularity are normal. The mediastinum is
normal in width. The trachea is midline. There is no pleural
effusion. The bony thorax exhibits no acute abnormality.
IMPRESSION: COPD.  No pneumonia nor other acute cardiopulmonary abnormality.

## 2018-08-11 ENCOUNTER — Ambulatory Visit: Payer: Medicare Other | Admitting: Pulmonary Disease

## 2018-09-02 ENCOUNTER — Other Ambulatory Visit: Payer: Self-pay | Admitting: *Deleted

## 2018-09-02 DIAGNOSIS — J449 Chronic obstructive pulmonary disease, unspecified: Secondary | ICD-10-CM

## 2018-09-02 MED ORDER — MOMETASONE FURO-FORMOTEROL FUM 100-5 MCG/ACT IN AERO: INHALATION_SPRAY | RESPIRATORY_TRACT | 11 refills | Status: DC

## 2018-09-17 ENCOUNTER — Ambulatory Visit: Payer: Medicare Other | Admitting: Pulmonary Disease

## 2018-10-20 ENCOUNTER — Ambulatory Visit (INDEPENDENT_AMBULATORY_CARE_PROVIDER_SITE_OTHER): Payer: Medicare Other | Admitting: Pulmonary Disease

## 2018-10-20 ENCOUNTER — Encounter: Payer: Self-pay | Admitting: Pulmonary Disease

## 2018-10-20 ENCOUNTER — Other Ambulatory Visit: Payer: Self-pay

## 2018-10-20 DIAGNOSIS — J449 Chronic obstructive pulmonary disease, unspecified: Secondary | ICD-10-CM

## 2018-10-20 NOTE — Progress Notes (Signed)
Virtual Visit via Telephone Note  I connected with [name] [name] on 10/20/18 at 11:30 AM EDT by telephone and verified that I am speaking with the correct person using two identifiers.  Location: Patient: Home Provider: [facility], [address], [place], [place]   I discussed the limitations, risks, security and privacy concerns of performing an evaluation and management service by telephone and the availability of in person appointments. I also discussed with the patient that there may be a patient responsible charge related to this service. The patient expressed understanding and agreed to proceed.  History of Present Illness: [name] [name] is a 78 year old with past medical history of COPD (CAT score 15, 0 exacerbations over past year, FEV1 30%), hypertension, heavy smoking in the past.   Observations/Objective: Symptoms continue to be stable.  He has mild dyspnea on exertion.  No symptoms at rest No cough, wheezing CAT score today is 9 Continues on Dulera inhaler without any issue  Assessment and Plan: Stable COPD  Follow Up Instructions: Continue Dulera inhaler Follow-up in 1 year   I discussed the assessment and treatment plan with the patient. The patient was provided an opportunity to ask questions and all were answered. The patient agreed with the plan and demonstrated an understanding of the instructions.   The patient was advised to call back or seek an in-person evaluation if the symptoms worsen or if the condition fails to improve as anticipated.  I provided 25 minutes of non-face-to-face time during this encounter.   Chilton Greathouse MD [facility] 10/20/2018, 11:42 AM

## 2018-10-20 NOTE — Patient Instructions (Signed)
I am glad you are doing well with your breathing Continue the [facility] inhaler Follow-up in 1 year Please give [place] a call sooner if there is any change in your symptoms.

## 2018-12-15 DIAGNOSIS — R7301 Impaired fasting glucose: Secondary | ICD-10-CM | POA: Diagnosis not present

## 2018-12-15 DIAGNOSIS — J449 Chronic obstructive pulmonary disease, unspecified: Secondary | ICD-10-CM | POA: Diagnosis not present

## 2018-12-15 DIAGNOSIS — N189 Chronic kidney disease, unspecified: Secondary | ICD-10-CM | POA: Diagnosis not present

## 2018-12-15 DIAGNOSIS — R42 Dizziness and giddiness: Secondary | ICD-10-CM | POA: Diagnosis not present

## 2018-12-15 DIAGNOSIS — I10 Essential (primary) hypertension: Secondary | ICD-10-CM | POA: Diagnosis not present

## 2018-12-15 DIAGNOSIS — N400 Enlarged prostate without lower urinary tract symptoms: Secondary | ICD-10-CM | POA: Diagnosis not present

## 2018-12-16 DIAGNOSIS — R7301 Impaired fasting glucose: Secondary | ICD-10-CM | POA: Diagnosis not present

## 2018-12-16 DIAGNOSIS — N400 Enlarged prostate without lower urinary tract symptoms: Secondary | ICD-10-CM | POA: Diagnosis not present

## 2018-12-16 DIAGNOSIS — N189 Chronic kidney disease, unspecified: Secondary | ICD-10-CM | POA: Diagnosis not present

## 2018-12-16 DIAGNOSIS — J449 Chronic obstructive pulmonary disease, unspecified: Secondary | ICD-10-CM | POA: Diagnosis not present

## 2018-12-16 DIAGNOSIS — R42 Dizziness and giddiness: Secondary | ICD-10-CM | POA: Diagnosis not present

## 2018-12-16 DIAGNOSIS — I10 Essential (primary) hypertension: Secondary | ICD-10-CM | POA: Diagnosis not present

## 2019-04-01 DIAGNOSIS — N183 Chronic kidney disease, stage 3 unspecified: Secondary | ICD-10-CM | POA: Diagnosis not present

## 2019-04-01 DIAGNOSIS — J449 Chronic obstructive pulmonary disease, unspecified: Secondary | ICD-10-CM | POA: Diagnosis not present

## 2019-04-01 DIAGNOSIS — R7303 Prediabetes: Secondary | ICD-10-CM | POA: Diagnosis not present

## 2019-04-01 DIAGNOSIS — I129 Hypertensive chronic kidney disease with stage 1 through stage 4 chronic kidney disease, or unspecified chronic kidney disease: Secondary | ICD-10-CM | POA: Diagnosis not present

## 2019-04-11 DIAGNOSIS — H40022 Open angle with borderline findings, high risk, left eye: Secondary | ICD-10-CM | POA: Diagnosis not present

## 2019-04-11 DIAGNOSIS — G43B0 Ophthalmoplegic migraine, not intractable: Secondary | ICD-10-CM | POA: Diagnosis not present

## 2019-04-11 DIAGNOSIS — H11153 Pinguecula, bilateral: Secondary | ICD-10-CM | POA: Diagnosis not present

## 2019-04-11 DIAGNOSIS — H18413 Arcus senilis, bilateral: Secondary | ICD-10-CM | POA: Diagnosis not present

## 2019-04-11 DIAGNOSIS — H40033 Anatomical narrow angle, bilateral: Secondary | ICD-10-CM | POA: Diagnosis not present

## 2019-04-11 DIAGNOSIS — H2513 Age-related nuclear cataract, bilateral: Secondary | ICD-10-CM | POA: Diagnosis not present

## 2019-04-11 DIAGNOSIS — H40011 Open angle with borderline findings, low risk, right eye: Secondary | ICD-10-CM | POA: Diagnosis not present

## 2019-04-11 DIAGNOSIS — H25013 Cortical age-related cataract, bilateral: Secondary | ICD-10-CM | POA: Diagnosis not present

## 2019-04-15 DIAGNOSIS — I129 Hypertensive chronic kidney disease with stage 1 through stage 4 chronic kidney disease, or unspecified chronic kidney disease: Secondary | ICD-10-CM | POA: Diagnosis not present

## 2019-04-15 DIAGNOSIS — N183 Chronic kidney disease, stage 3 unspecified: Secondary | ICD-10-CM | POA: Diagnosis not present

## 2019-04-15 DIAGNOSIS — J449 Chronic obstructive pulmonary disease, unspecified: Secondary | ICD-10-CM | POA: Diagnosis not present

## 2019-04-15 DIAGNOSIS — R7303 Prediabetes: Secondary | ICD-10-CM | POA: Diagnosis not present

## 2019-04-15 DIAGNOSIS — Z136 Encounter for screening for cardiovascular disorders: Secondary | ICD-10-CM | POA: Diagnosis not present

## 2019-06-14 ENCOUNTER — Ambulatory Visit: Payer: Medicare Other | Attending: Internal Medicine

## 2019-06-14 DIAGNOSIS — Z23 Encounter for immunization: Secondary | ICD-10-CM

## 2019-06-14 NOTE — Progress Notes (Signed)
   Covid-19 Vaccination Clinic  Name:  [name] [name]    MRN: [id number] DOB: Jun 09, 1940  06/14/2019  Mr. [name] [name] was observed post Covid-19 immunization for 15 minutes without incidence. He was provided with Vaccine Information Sheet and instruction to access the V-Safe system.   Mr. [name] [name] was instructed to call 911 with any severe reactions post vaccine: [name] Difficulty breathing  . Swelling of your face and throat  . A fast heartbeat  . A bad rash all over your body  . Dizziness and weakness    Immunizations Administered    Name Date Dose VIS Date Route   Pfizer COVID-19 Vaccine 06/14/2019  8:46 AM 0.3 mL 05/13/2019 Intramuscular   Manufacturer: [employer], [employer]   Lot: [id number]   NDC: [address]-0

## 2019-07-04 ENCOUNTER — Ambulatory Visit: Payer: Medicare Other | Attending: Internal Medicine

## 2019-07-04 DIAGNOSIS — Z23 Encounter for immunization: Secondary | ICD-10-CM

## 2019-07-04 NOTE — Progress Notes (Signed)
   Covid-19 Vaccination Clinic  Name:  [name] [name]    MRN: [id number] DOB: Jul 21, 1940  07/04/2019  [name] was observed post Covid-19 immunization for 15 minutes without incidence. He was provided with Vaccine Information Sheet and instruction to access the V-Safe system.   [name] was instructed to call 911 with any severe reactions post vaccine: [name] Difficulty breathing  . Swelling of your face and throat  . A fast heartbeat  . A bad rash all over your body  . Dizziness and weakness    Immunizations Administered    Name Date Dose VIS Date Route   Pfizer COVID-19 Vaccine 07/04/2019  8:51 AM 0.3 mL 05/13/2019 Intramuscular   Manufacturer: [employer], [employer]   Lot: [id number]   NDC: [address]-5

## 2019-09-05 ENCOUNTER — Other Ambulatory Visit: Payer: Self-pay | Admitting: Pulmonary Disease

## 2019-09-05 DIAGNOSIS — J449 Chronic obstructive pulmonary disease, unspecified: Secondary | ICD-10-CM

## 2019-10-10 ENCOUNTER — Encounter: Payer: Self-pay | Admitting: Pulmonary Disease

## 2019-10-10 ENCOUNTER — Other Ambulatory Visit: Payer: Self-pay

## 2019-10-10 ENCOUNTER — Ambulatory Visit (INDEPENDENT_AMBULATORY_CARE_PROVIDER_SITE_OTHER): Payer: Medicare Other

## 2019-10-10 ENCOUNTER — Ambulatory Visit (INDEPENDENT_AMBULATORY_CARE_PROVIDER_SITE_OTHER): Payer: Medicare Other | Admitting: Pulmonary Disease

## 2019-10-10 DIAGNOSIS — R0600 Dyspnea, unspecified: Secondary | ICD-10-CM

## 2019-10-10 MED ORDER — BEVESPI AEROSPHERE 9-4.8 MCG/ACT IN AERO: 2.0000 | INHALATION_SPRAY | Freq: Two times a day (BID) | RESPIRATORY_TRACT | 5 refills | Status: DC

## 2019-10-10 NOTE — Progress Notes (Signed)
Dictation #1 [id number]  [id number]               GERALD KUEHL    [id number]    Dec 15, 1940  Primary Care Physician:Patient, No Pcp Per  Referring Physician: Iva Boop, MD [address] [place],  [place] [address]  Chief complaint:   Follow up for COPD GOLD B  HPI: Mr. [name] [name] is a 79 year old with past medical history of COPD (CAT score 15, 0 exacerbations over past year, FEV1 30%), hypertension, heavy smoking in the past.  He was seen by Dr. Clarene Duke at the primary care office in Dec 2016 for an exacerbation and given a prednisone taper starting at 60 mg for 12 days and started on Dulera inhaler. He reports that his symptoms have improved and feels close to his baseline.  Tried on Anoro inhaler in 2019.  He switch back to [facility] since he felt that the new inhaler did not work well for him   Interim History: Reports mild increase in dyspnea on exertion over the past year He has not been very active and stopped swimming due to Covid pandemic and put on a few pounds Denies any cough, sputum production, fevers, chills.  Outpatient Encounter Medications as of 10/10/2019  Medication Sig  . atorvastatin (LIPITOR) 10 MG tablet Take 10 mg by mouth daily.  [name] losartan (COZAAR) 100 MG tablet Take 100 mg by mouth daily.  . mometasone-formoterol (DULERA) 100-5 MCG/ACT AERO TAKE 2 PUFFS FIRST THING IN THE MORNING AND THEN ANOTHER 2 PUFFS ABOUT 12 HOURS LATER  . trimethoprim (TRIMPEX) 100 MG tablet Take 100 mg by mouth daily.  . [DISCONTINUED] amLODipine (NORVASC) 2.5 MG tablet Take 2.5 mg by mouth daily.   No facility-administered encounter medications on file as of 10/10/2019.   Physical Exam: Blood pressure 132/74, pulse 65, height 6' (1.829 m), weight 209 lb (94.8 kg), SpO2 96 %. Gen:      No acute distress HEENT:  EOMI, sclera anicteric Neck:     No masses; no thyromegaly Lungs:    Clear to auscultation bilaterally; normal respiratory effort CV:         Regular rate and rhythm;  no murmurs Abd:      + bowel sounds; soft, non-tender; no palpable masses, no distension Ext:    No edema; adequate peripheral perfusion Skin:      Warm and dry; no rash Neuro: alert and oriented x 3 Psych: normal mood and affect  Data Reviewed: PFTs  05/23/15 FVC 3.59 (78%), FEV1 1.08 (30%), F/F 38 Severe obstructive lung disease.  08/11/2017 FVC 4.72 [105%], FEV1 2.92 [90%], F/F 62, TLC 142%, DLCO 70% Mild obstructive airway disease with air trapping, hyperinflation, reversibility.  Labs A1AT levels 12/20/15- 109, PIMM phenotype CBC 08/11/2017-WBC 10.7, eos 1.7%, absolute eosinophil count 182 Blood allergy profile 08/11/2017-IgE 154, RAST panel negative  Imaging Chest x-ray 07/03/16-  hyperinflated lung, mild left base atelectasis/scarring Chest x-ray 07/13/2017- hyperinflated lungs CT abdomen pelvis 07/24/2017- mild scarring in the bases.  No evidence of interstitial lung disease. I have reviewed the images personally.  Assessment:  COPD GOLD B, asthma Has mild worsening in dyspnea which may be due to inactivity, weight gain during COVID-19 pandemic We will get chest x-ray today and repeat pulmonary function test for reevaluation Try Breztri inhaler  Pulmonary rehab discussed but deferred for now as he prefers to resume his exercise including swimming  Health maintenance 08/19/2013-Prevnar 06/02/2013-Pneumovax  Plan/Recommendations: - Stop Dulera, start breztri - Chest x-ray today -  PFTs and follow-up in  3 months. -Resume exercise regimen.  Marshell Garfinkel MD [facility] 10/10/2019, 12:11 PM  CC: ViaLennette Bihari, MD

## 2019-10-10 NOTE — Patient Instructions (Signed)
We will get a chest x-ray today for evaluation of the lung Schedule pulmonary function test and follow-up in 3 months We will stop the [facility] and start you on a different inhaler called the office. If this is not covered by her insurance or because too much then check with your insurance company to see if there are preferred inhalers and let us know Follow-up in 3 months after PFTs

## 2019-11-28 ENCOUNTER — Other Ambulatory Visit (HOSPITAL_COMMUNITY): Payer: Medicare Other

## 2019-12-01 ENCOUNTER — Ambulatory Visit (INDEPENDENT_AMBULATORY_CARE_PROVIDER_SITE_OTHER): Payer: Medicare Other | Admitting: Pulmonary Disease

## 2019-12-01 ENCOUNTER — Other Ambulatory Visit: Payer: Self-pay

## 2019-12-01 DIAGNOSIS — R0600 Dyspnea, unspecified: Secondary | ICD-10-CM | POA: Diagnosis not present

## 2019-12-01 LAB — PULMONARY FUNCTION TEST
DL/VA % pred: 135 %
DL/VA: 5.24 ml/min/mmHg/L
DLCO cor % pred: 164 %
DLCO cor: 42.97 ml/min/mmHg
DLCO unc % pred: 90 %
DLCO unc: 23.57 ml/min/mmHg
FEF 25-75 Post: 1.63 L/sec
FEF 25-75 Pre: 1.54 L/sec
FEF2575-%Change-Post: 6 %
FEF2575-%Pred-Post: 73 %
FEF2575-%Pred-Pre: 69 %
FEV1-%Change-Post: 0 %
FEV1-%Pred-Post: 95 %
FEV1-%Pred-Pre: 95 %
FEV1-Post: 3.03 L
FEV1-Pre: 3.01 L
FEV1FVC-%Change-Post: 1 %
FEV1FVC-%Pred-Pre: 87 %
FEV6-%Change-Post: 0 %
FEV6-%Pred-Post: 111 %
FEV6-%Pred-Pre: 111 %
FEV6-Post: 4.61 L
FEV6-Pre: 4.62 L
FEV6FVC-%Change-Post: 1 %
FEV6FVC-%Pred-Post: 103 %
FEV6FVC-%Pred-Pre: 102 %
FVC-%Change-Post: -1 %
FVC-%Pred-Post: 107 %
FVC-%Pred-Pre: 108 %
FVC-Post: 4.74 L
FVC-Pre: 4.81 L
Post FEV1/FVC ratio: 64 %
Post FEV6/FVC ratio: 97 %
Pre FEV1/FVC ratio: 63 %
Pre FEV6/FVC Ratio: 96 %
RV % pred: 157 %
RV: 4.33 L
TLC % pred: 126 %
TLC: 9.46 L

## 2019-12-01 NOTE — Progress Notes (Signed)
PFT Done today. 

## 2020-01-02 ENCOUNTER — Ambulatory Visit: Payer: Medicare Other | Admitting: Pulmonary Disease

## 2020-01-12 ENCOUNTER — Other Ambulatory Visit: Payer: Self-pay | Admitting: Pulmonary Disease

## 2020-01-12 DIAGNOSIS — J449 Chronic obstructive pulmonary disease, unspecified: Secondary | ICD-10-CM

## 2020-01-23 ENCOUNTER — Other Ambulatory Visit: Payer: Self-pay | Admitting: Pulmonary Disease

## 2020-01-23 DIAGNOSIS — J449 Chronic obstructive pulmonary disease, unspecified: Secondary | ICD-10-CM

## 2020-01-24 ENCOUNTER — Other Ambulatory Visit: Payer: Self-pay

## 2020-01-24 ENCOUNTER — Ambulatory Visit (INDEPENDENT_AMBULATORY_CARE_PROVIDER_SITE_OTHER): Payer: Medicare Other | Admitting: Pulmonary Disease

## 2020-01-24 ENCOUNTER — Encounter: Payer: Self-pay | Admitting: Pulmonary Disease

## 2020-01-24 VITALS — BP 124/80 | HR 55 | Temp 98.1°F | Ht 72.0 in | Wt 214.8 lb

## 2020-01-24 DIAGNOSIS — J449 Chronic obstructive pulmonary disease, unspecified: Secondary | ICD-10-CM

## 2020-01-24 MED ORDER — DULERA 100-5 MCG/ACT IN AERO: INHALATION_SPRAY | RESPIRATORY_TRACT | 6 refills | Status: DC

## 2020-01-24 NOTE — Patient Instructions (Signed)
I am glad you are doing well with regard to your breathing Continue Dulera Follow-up in 1 year.

## 2020-01-24 NOTE — Progress Notes (Signed)
[name] [name]    [id number]    1940/09/27  Primary Care Physician:Patient, No Pcp Per  Referring Physician: No referring provider defined for this encounter.  Chief complaint:   Follow up for COPD GOLD B  HPI: Mr. Steadham is a 79 year old with past medical history of COPD (CAT score 15, 0 exacerbations over past year, FEV1 30%), hypertension, heavy smoking in the past [quit in 1987]  He was seen by Dr. Clarene Duke at the primary care office in Dec 2016 for an exacerbation and given a prednisone taper starting at 60 mg for 12 days and started on Dulera inhaler. He reports that his symptoms have improved and feels close to his baseline.  Tried on Anoro inhaler in 2019.  He switch back to [facility] since he felt that the new inhaler did not work well for him   Interim History: Doing well with no issues Respiratory tried at last visit but cost was too much so he is back on [place]. States that dyspnea is stable.  Outpatient Encounter Medications as of 01/24/2020  Medication Sig   amLODipine (NORVASC) 2.5 MG tablet Take 2.5 mg by mouth daily.   atorvastatin (LIPITOR) 10 MG tablet Take 10 mg by mouth daily.   losartan (COZAAR) 100 MG tablet Take 100 mg by mouth daily.   mometasone-formoterol (DULERA) 100-5 MCG/ACT AERO TAKE 2 PUFFS FIRST THING IN THE MORNING AND THEN ANOTHER 2 PUFFS ABOUT 12 HOURS LATER   trimethoprim (TRIMPEX) 100 MG tablet Take 100 mg by mouth daily.   Glycopyrrolate-Formoterol (BEVESPI AEROSPHERE) 9-4.8 MCG/ACT AERO Inhale 2 puffs into the lungs in the morning and at bedtime. (Patient not taking: Reported on 01/24/2020)   No facility-administered encounter medications on file as of 01/24/2020.   Physical Exam: Blood pressure 132/74, pulse 65, height 6' (1.829 m), weight 209 lb (94.8 kg), SpO2 96 %. Gen:      No acute distress HEENT:  EOMI, sclera anicteric Neck:     No masses; no thyromegaly Lungs:    Clear to auscultation bilaterally; normal  respiratory effort CV:         Regular rate and rhythm; no murmurs Abd:      + bowel sounds; soft, non-tender; no palpable masses, no distension Ext:    No edema; adequate peripheral perfusion Skin:      Warm and dry; no rash Neuro: alert and oriented x 3 Psych: normal mood and affect  Data Reviewed: PFTs  05/23/15 FVC 3.59 (78%), FEV1 1.08 (30%), F/F 38 Severe obstructive lung disease.  08/11/2017 FVC 4.72 [105%], FEV1 2.92 [90%], F/F 62, TLC 142%, DLCO 70% Mild obstructive airway disease with air trapping, hyperinflation, reversibility.  12/01/2019 FVC 4.74 [107%], FEV1 3.03 [94%], F/F 64, TLC 9.46 [126%), DLCO 23.57 [90%] Mild obstructive airway disease with air trapping and hyperinflation.  Mildly improved compared to 2019  Labs A1AT levels 12/20/15- 109, PIMM phenotype CBC 08/11/2017-WBC 10.7, eos 1.7%, absolute eosinophil count 182 Blood allergy profile 08/11/2017-IgE 154, RAST panel negative  Imaging Chest x-ray 07/03/16-  hyperinflated lung, mild left base atelectasis/scarring Chest x-ray 07/13/2017- hyperinflated lungs CT abdomen pelvis 07/24/2017- mild scarring in the bases.  No evidence of interstitial lung disease. Chest x-ray 10/10/2019-no acute cardiopulmonary disease I have reviewed the images personally  Assessment:  COPD GOLD B, asthma We will start dentistry at last visit but could not afford it Back on [facility] which works well for her PFTs  reviewed with mild improvement in obstructive airway disease  Pulmonary rehab discussed but deferred for now as he prefers to avoid due to Covid  Health maintenance 08/19/2013-Prevnar 06/02/2013-Pneumovax Vaccinated with Covid  Plan/Recommendations: -Continue Dulera  Follow-up in 1 year  Chilton Greathouse MD [facility] 01/24/2020, 8:45 AM  CC: No ref. provider found

## 2020-02-03 ENCOUNTER — Emergency Department (HOSPITAL_COMMUNITY)
Admission: EM | Admit: 2020-02-03 | Discharge: 2020-02-03 | Disposition: A | Discharge status: Home / Self Care | Payer: Medicare Other | Attending: Emergency Medicine | Admitting: Emergency Medicine

## 2020-02-03 ENCOUNTER — Encounter (HOSPITAL_COMMUNITY): Payer: Self-pay | Admitting: *Deleted

## 2020-02-03 ENCOUNTER — Other Ambulatory Visit: Payer: Self-pay

## 2020-02-03 ENCOUNTER — Emergency Department (HOSPITAL_COMMUNITY): Payer: Medicare Other

## 2020-02-03 DIAGNOSIS — S01111A Laceration without foreign body of right eyelid and periocular area, initial encounter: Secondary | ICD-10-CM | POA: Insufficient documentation

## 2020-02-03 DIAGNOSIS — Z5321 Procedure and treatment not carried out due to patient leaving prior to being seen by health care provider: Secondary | ICD-10-CM | POA: Diagnosis not present

## 2020-02-03 DIAGNOSIS — R55 Syncope and collapse: Secondary | ICD-10-CM | POA: Insufficient documentation

## 2020-02-03 DIAGNOSIS — X58XXXA Exposure to other specified factors, initial encounter: Secondary | ICD-10-CM | POA: Insufficient documentation

## 2020-02-03 DIAGNOSIS — Y999 Unspecified external cause status: Secondary | ICD-10-CM | POA: Insufficient documentation

## 2020-02-03 DIAGNOSIS — R42 Dizziness and giddiness: Secondary | ICD-10-CM | POA: Diagnosis not present

## 2020-02-03 DIAGNOSIS — S0993XA Unspecified injury of face, initial encounter: Secondary | ICD-10-CM | POA: Diagnosis present

## 2020-02-03 DIAGNOSIS — Y929 Unspecified place or not applicable: Secondary | ICD-10-CM | POA: Insufficient documentation

## 2020-02-03 DIAGNOSIS — S01112A Laceration without foreign body of left eyelid and periocular area, initial encounter: Secondary | ICD-10-CM | POA: Insufficient documentation

## 2020-02-03 DIAGNOSIS — Y9301 Activity, walking, marching and hiking: Secondary | ICD-10-CM | POA: Diagnosis not present

## 2020-02-03 LAB — CBC
HCT: 42.6 % (ref 39.0–52.0)
Hemoglobin: 14.3 g/dL (ref 13.0–17.0)
MCH: 30 pg (ref 26.0–34.0)
MCHC: 33.6 g/dL (ref 30.0–36.0)
MCV: 89.5 fL (ref 80.0–100.0)
Platelets: 194 10*3/uL (ref 150–400)
RBC: 4.76 MIL/uL (ref 4.22–5.81)
RDW: 13.2 % (ref 11.5–15.5)
WBC: 9.5 10*3/uL (ref 4.0–10.5)
nRBC: 0 % (ref 0.0–0.2)

## 2020-02-03 LAB — BASIC METABOLIC PANEL
Anion gap: 8 (ref 5–15)
BUN: 19 mg/dL (ref 8–23)
CO2: 23 mmol/L (ref 22–32)
Calcium: 8.4 mg/dL — ABNORMAL LOW (ref 8.9–10.3)
Chloride: 107 mmol/L (ref 98–111)
Creatinine, Ser: 1.22 mg/dL (ref 0.61–1.24)
GFR calc Af Amer: >60 mL/min (ref >60)
GFR calc non Af Amer: 56 mL/min — ABNORMAL LOW (ref >60)
Glucose, Bld: 117 mg/dL — ABNORMAL HIGH (ref 70–99)
Potassium: 4.2 mmol/L (ref 3.5–5.1)
Sodium: 138 mmol/L (ref 135–145)

## 2020-02-03 NOTE — ED Triage Notes (Signed)
Pt reporting he was walking to get some coffee, was dizzy and woke up on the floor. +LOC, one deep lac over each eye brow.

## 2020-02-09 NOTE — Progress Notes (Signed)
Cardiology Office Note:   Date:  02/10/2020  NAME:  [name] [name]    MRN: [id number] DOB:  05/19/1941   PCP:  Sigmund HazelMiller, Lisa, MD  Cardiologist:  No primary care provider on file.   Referring MD: Sigmund HazelMiller, Lisa, MD   Chief Complaint  Patient presents with  . Loss of Consciousness   History of Present Illness:   [name] [name] is a 79 y.o. male with a hx of COPD, HTN who is being seen today for the evaluation of syncope at the request of Sigmund HazelMiller, Lisa, MD.  He presents for evaluation of 2 syncopal episodes.  Family 1 week ago he woke up in the morning around 4:15 AM.  He apparently left the dog out and then blacked out.  He reports he had no chest pain or shortness of breath before the episode.  He reports he felt a little dizzy and then woke up on the floor.  He had a pretty bad laceration to the face with that fall.  Approximately 2 days ago he had another fall.  Upon standing up he reports he became dizzy and his wife reports she had to grab him to prevent him from falling.  With both episodes he apparently comes to within seconds.  He denies any defecation or urination on himself.  He reports no chest pain.  He reports for the past 5 to 6 months he gets dizzy with activity.  Whenever he stands up or tries to do things with any exertion he can get dizzy.  Despite this he has been able to maintain a high level of activity.  He reports he is walked his dog for up to 30 to 40 minutes without any chest pain or shortness of breath.  He does just get dizziness intermittently.  The syncopal episodes are bothersome to him.  He does have a history of COPD.  This is mild per reports.  He had an echocardiogram in 2017 that was normal.  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  He is not had updated thyroid studies.  Most recent A1c 6.0.  Most recent lipid profile shows total cholesterol 123, HDL 62, LDL 49, triglycerides  54.  Most recent hemoglobin 14.3.  Serum creatinine 1.22.  He does report that he has had bladder dysfunction for years.  He has to self catheterize several times per day.  He reports none of his issues around syncope or dizziness revolve around self-catheterization.  There is no strong family history of heart disease.  He is a former smoker but quit.  He does drink alcohol daily.  No illicit drug use is reported.  1. Problem List 1. COPD -mild obstructive disease 2. HTN 3. Tobacco abuse  4. Bladder dysfunction -self catheterization (4-5 years)  Past Medical History: Past Medical History:  Diagnosis Date  . Colon polyp March 2010   colonoscopy with Dr Randa EvensEdwards  . Elevated fasting glucose   . History of BPH    with obstruction and hydronephrosis and slihtly elevated creatinine, using self catherization, followed by urologist Dr McDiarmid  . History of ETT 2012   [name]  . History of tobacco use    54 pack yr  . HTN (hypertension)   . Paroxysmal supraventricular tachycardia (HCC)   . Screening for AAA (abdominal aortic aneurysm) 3/16   3cm, nl/early aneurysm, repeat 3 yrs with u/s 3/19    Past Surgical History: Past Surgical History:  Procedure Laterality Date  . APPENDECTOMY    . REPLACEMENT TOTAL KNEE BILATERAL  [id number]  . TRANSURETHRAL RESECTION OF PROSTATE  2010    Current Medications: Current Meds  Medication Sig  . amLODipine (NORVASC) 2.5 MG tablet Take 2.5 mg by mouth daily.  [name] atorvastatin (LIPITOR) 10 MG tablet Take 10 mg by mouth daily.  [name] losartan (COZAAR) 100 MG tablet Take 100 mg by mouth daily.  . mometasone-formoterol (DULERA) 100-5 MCG/ACT AERO TAKE 2 PUFFS FIRST THING IN THE MORNING AND THEN ANOTHER 2 PUFFS ABOUT 12 HOURS LATER  . trimethoprim (TRIMPEX) 100 MG tablet Take 100 mg by mouth daily.     Allergies:    Nsaids   Social History: Social History   Socioeconomic History  . Marital status: Married    Spouse name: Not on file  . Number of  children: Not on file  . Years of education: Not on file  . Highest education level: Not on file  Occupational History  . Occupation: retired  Tobacco Use  . Smoking status: Former Smoker    Packs/day: 3.00    Years: 30.00    Pack years: 90.00    Types: Cigarettes    Quit date: 06/02/1985    Years since quitting: 34.7  . Smokeless tobacco: Never Used  Vaping Use  . Vaping Use: Never used  Substance and Sexual Activity  . Alcohol use: Yes    Alcohol/week: 0.0 standard drinks    Comment: daily wine, occasional vodka  . Drug use: Never  . Sexual activity: Not on file  Other Topics Concern  . Not on file  Social History Narrative   Caffeine: yes   No diet   Exercise: yes   Married to [place]   2 children   Retired - Education officer, environmental, with extensive traveling   Social Determinants of Corporate investment banker Strain:   . Difficulty of Paying Living Expenses: Not on file  Food Insecurity:   . Worried About Programme researcher, broadcasting/film/video in the Last Year: Not on file  . Ran Out of Food in the Last Year: Not on file  Transportation Needs:   . Lack of Transportation (Medical): Not on file  . Lack of Transportation (Non-Medical): Not on file  Physical Activity:   . Days of Exercise per Week: Not on file  . Minutes of Exercise per Session: Not on file  Stress:   . Feeling of Stress : Not on file  Social Connections:   . Frequency of Communication with Friends and Family: Not on file  . Frequency of Social Gatherings with Friends and Family: Not on file  . Attends Religious Services: Not on file  . Active Member of Clubs or Organizations: Not on file  . Attends Banker Meetings: Not on file  . Marital Status: Not on file     Family History: The patient's family history includes Healthy in his brother and brother; Kidney disease in his father. There is no history of Colon cancer or Colon polyps.  ROS:   All other ROS reviewed and negative. Pertinent positives noted  in the HPI.     EKGs/Labs/Other Studies Reviewed:   The following studies were personally reviewed by me today:  EKG:  EKG is ordered today.  The ekg ordered today demonstrates sinus bradycardia, heart rate 56, trifascicular block noted, personally AV block with PR interval 263, right bundle branch block left anterior fascicular block, and was personally reviewed by me.  Recent Labs: 02/03/2020: BUN 19; Creatinine, Ser 1.22; Hemoglobin 14.3; Platelets 194; Potassium 4.2; Sodium 138   Recent Lipid Panel No results found for: CHOL, TRIG, HDL, CHOLHDL, VLDL, LDLCALC, LDLDIRECT  Physical Exam:   VS:  BP 140/84   Pulse (!) 56   Ht 6' (1.829 m)   Wt 214 lb 6.4 oz (97.3 kg)   SpO2 98%   BMI 29.08 kg/m    Wt Readings from Last 3 Encounters:  02/10/20 214 lb 6.4 oz (97.3 kg)  02/03/20 210 lb (95.3 kg)  01/24/20 214 lb 12.8 oz (97.4 kg)    General: Well nourished, well developed, in no acute distress Heart: Atraumatic, normal size  Eyes: PEERLA, EOMI  Neck: Supple, no JVD Endocrine: No thryomegaly Cardiac: Normal S1, S2; RRR; no murmurs, rubs, or gallops Lungs: Clear to auscultation bilaterally, no wheezing, rhonchi or rales  Abd: Soft, nontender, no hepatomegaly  Ext: No edema, pulses 2+ Musculoskeletal: No deformities, BUE and BLE strength normal and equal Skin: Warm and dry, no rashes   Neuro: Alert and oriented to person, place, time, and situation, CNII-XII grossly intact, no focal deficits  Psych: Normal mood and affect   ASSESSMENT:   [name] [name] is a 79 y.o. male who presents for the following: 1. Syncope and collapse   2. Essential hypertension   3. Trifascicular block     PLAN:   1. Syncope and collapse 2. Essential hypertension 3. Trifascicular block -He presents with 2 recurrent syncopal episodes.  Both of occurred upon position change walking.  He reports he felt dizzy and then passed out.  He denies any chest pain or shortness of breath before the episodes.   There is no seizure-like activity reported.  There is no urination or defecation reported either.  His EKG today demonstrates trifascicular block.  I am highly concerned that his symptoms are possibly related to worsening conduction disease.  I think he ultimately will need a pacemaker.  We will update his thyroid studies today.  He is not anemic.  No recent medications to explain his symptoms.  He denies any chest pain.  We will also proceed with a 3-day Zio patch to look for any significant bradycardia.  I would like for him to do an exercise treadmill stress test to see what his heart rate does when he exerts himself.  I also will get an updated echocardiogram.  I will plan to see him back in 1 month hopefully we can find a cardiac reason for his symptoms.  They are quite alarming.  He is also been instructed he cannot drive until this is sorted out.  His wife was in the room and they both agree with that recommendation.  I would also like for him to continue to try to remain active within reason for him.  He should also try to avoid any dangerous activity that may put him at risk for any serious injury.  Disposition: Return in about 1 month (around 03/11/2020).  Medication Adjustments/Labs and Tests Ordered: Current medicines are reviewed at length with the patient today.  Concerns regarding medicines are outlined above.  Orders Placed This Encounter  Procedures  . TSH  . LONG TERM MONITOR (3-14 DAYS)  . EXERCISE TOLERANCE TEST (ETT)  . EKG 12-Lead  . ECHOCARDIOGRAM COMPLETE   No orders of the defined types were placed in this encounter.   Patient Instructions  Medication Instructions:  The current medical regimen is effective;  continue present plan and medications.  *  If you need a refill on your cardiac medications before your next appointment, please call your pharmacy*   Lab Work: TSH today   If you have labs (blood work) drawn today and your tests are completely normal, you will  receive your results only by: [name] MyChart Message (if you have MyChart) OR . A paper copy in the mail If you have any lab test that is abnormal or we need to change your treatment, we will call you to review the results.   Testing/Procedures: Echocardiogram - Your physician has requested that you have an echocardiogram. Echocardiography is a painless test that uses sound waves to create images of your heart. It provides your doctor with information about the size and shape of your heart and how well your heart's chambers and valves are working. This procedure takes approximately one hour. There are no restrictions for this procedure. This will be performed at our [facility] location - [address].  Your physician has recommended that you wear a 3 DAY ZIO-PATCH monitor. The Zio patch cardiac monitor continuously records heart rhythm data for up to 14 days, this is for patients being evaluated for multiple types heart rhythms. For the first 24 hours post application, please avoid getting the Zio monitor wet in the shower or by excessive sweating during exercise. After that, feel free to carry on with regular activities. Keep soaps and lotions away from the ZIO XT Patch.  This will be mailed to you, please expect 7-10 days to receive.    Applying the monitor   Shave hair from upper left chest.   Hold abrader disc by orange tab.  Rub abrader in 40 strokes over left upper chest as indicated in your monitor instructions.   Clean area with 4 enclosed alcohol pads .  Use all pads to assure are is cleaned thoroughly.  Let dry.   Apply patch as indicated in monitor instructions.  Patch will be place under collarbone on left side of chest with arrow pointing upward.   Rub patch adhesive wings for 2 minutes.Remove white label marked "1".  Remove white label marked "2".  Rub patch adhesive wings for 2 additional minutes.   While looking in a mirror, press and release button in center of  patch.  A small green light will flash 3-4 times .  This will be your only indicator the monitor has been turned on.     Do not shower for the first 24 hours.  You may shower after the first 24 hours.   Press button if you feel a symptom. You will hear a small click.  Record Date, Time and Symptom in the Patient Log Book.   When you are ready to remove patch, follow instructions on last 2 pages of Patient Log Book.  Stick patch monitor onto last page of Patient Log Book.   Place Patient Log Book in [place] box.  Use locking tab on box and tape box closed securely.  The Orange and [employer] has [employer] on it.  Please place in mailbox as soon as possible.  Your physician should have your test results approximately 7 days after the monitor has been mailed back to [facility].   Call [facility] Customer Care at [phone] if you have questions regarding your ZIO XT patch monitor.  Call them immediately if you see an orange light blinking on your monitor.   If your monitor falls off in less than 4 days contact our  Monitor department at [phone].  If your monitor becomes loose or falls off after 4 days call [employer] at [phone] for suggestions on securing your monitor     Follow-Up: At [facility], you and your health needs are our priority.  As part of our continuing mission to provide you with exceptional heart care, we have created designated Provider Care Teams.  These Care Teams include your primary Cardiologist (physician) and Advanced Practice Providers (APPs -  Physician Assistants and Nurse Practitioners) who all work together to provide you with the care you need, when you need it.  We recommend signing up for the patient portal called "MyChart".  Sign up information is provided on this After Visit Summary.  MyChart is used to connect with patients for Virtual Visits (Telemedicine).  Patients are able to view lab/test results, encounter notes, upcoming appointments,  etc.  Non-urgent messages can be sent to your provider as well.   To learn more about what you can do with MyChart, go to [email or web address].    Your next appointment:   1 month(s)  The format for your next appointment:   In Person  Provider:   Lennie Odor, MD       Signed, Lenna Gilford. Flora Lipps, MD [facility]  [address] [place], [place] [address] [phone]  02/10/2020 11:18 AM

## 2020-02-10 ENCOUNTER — Other Ambulatory Visit: Payer: Self-pay

## 2020-02-10 ENCOUNTER — Ambulatory Visit (INDEPENDENT_AMBULATORY_CARE_PROVIDER_SITE_OTHER): Payer: Medicare Other | Admitting: Cardiovascular Disease

## 2020-02-10 ENCOUNTER — Encounter: Payer: Self-pay | Admitting: Cardiovascular Disease

## 2020-02-10 ENCOUNTER — Telehealth: Payer: Self-pay

## 2020-02-10 VITALS — BP 140/84 | HR 56 | Ht 72.0 in | Wt 214.4 lb

## 2020-02-10 DIAGNOSIS — I453 Trifascicular block: Secondary | ICD-10-CM | POA: Diagnosis not present

## 2020-02-10 DIAGNOSIS — R55 Syncope and collapse: Secondary | ICD-10-CM

## 2020-02-10 DIAGNOSIS — I10 Essential (primary) hypertension: Secondary | ICD-10-CM | POA: Diagnosis not present

## 2020-02-10 LAB — TSH: TSH: 3.03 u[IU]/mL (ref 0.450–4.500)

## 2020-02-10 NOTE — Patient Instructions (Signed)
Medication Instructions:  The current medical regimen is effective;  continue present plan and medications.  *If you need a refill on your cardiac medications before your next appointment, please call your pharmacy*   Lab Work: TSH today   If you have labs (blood work) drawn today and your tests are completely normal, you will receive your results only by: [name] MyChart Message (if you have MyChart) OR . A paper copy in the mail If you have any lab test that is abnormal or we need to change your treatment, we will call you to review the results.   Testing/Procedures: Echocardiogram - Your physician has requested that you have an echocardiogram. Echocardiography is a painless test that uses sound waves to create images of your heart. It provides your doctor with information about the size and shape of your heart and how well your heart's chambers and valves are working. This procedure takes approximately one hour. There are no restrictions for this procedure. This will be performed at our [facility] location - [address].  Your physician has recommended that you wear a 3 DAY ZIO-PATCH monitor. The Zio patch cardiac monitor continuously records heart rhythm data for up to 14 days, this is for patients being evaluated for multiple types heart rhythms. For the first 24 hours post application, please avoid getting the Zio monitor wet in the shower or by excessive sweating during exercise. After that, feel free to carry on with regular activities. Keep soaps and lotions away from the ZIO XT Patch.  This will be mailed to you, please expect 7-10 days to receive.    Applying the monitor   Shave hair from upper left chest.   Hold abrader disc by orange tab.  Rub abrader in 40 strokes over left upper chest as indicated in your monitor instructions.   Clean area with 4 enclosed alcohol pads .  Use all pads to assure are is cleaned thoroughly.  Let dry.   Apply patch as indicated in  monitor instructions.  Patch will be place under collarbone on left side of chest with arrow pointing upward.   Rub patch adhesive wings for 2 minutes.Remove white label marked "1".  Remove white label marked "2".  Rub patch adhesive wings for 2 additional minutes.   While looking in a mirror, press and release button in center of patch.  A small green light will flash 3-4 times .  This will be your only indicator the monitor has been turned on.     Do not shower for the first 24 hours.  You may shower after the first 24 hours.   Press button if you feel a symptom. You will hear a small click.  Record Date, Time and Symptom in the Patient Log Book.   When you are ready to remove patch, follow instructions on last 2 pages of Patient Log Book.  Stick patch monitor onto last page of Patient Log Book.   Place Patient Log Book in [place] box.  Use locking tab on box and tape box closed securely.  The [employer] and [employer] has [employer] on it.  Please place in mailbox as soon as possible.  Your physician should have your test results approximately 7 days after the monitor has been mailed back to [facility].   Call [facility] Customer Care at [phone] if you have questions regarding your ZIO XT patch monitor.  Call them immediately if you see an orange light blinking on your  monitor.   If your monitor falls off in less than 4 days contact our Monitor department at [phone].  If your monitor becomes loose or falls off after 4 days call [employer] at [phone] for suggestions on securing your monitor     Follow-Up: At [facility], you and your health needs are our priority.  As part of our continuing mission to provide you with exceptional heart care, we have created designated Provider Care Teams.  These Care Teams include your primary Cardiologist (physician) and Advanced Practice Providers (APPs -  Physician Assistants and Nurse Practitioners) who all work together to provide  you with the care you need, when you need it.  We recommend signing up for the patient portal called "MyChart".  Sign up information is provided on this After Visit Summary.  MyChart is used to connect with patients for Virtual Visits (Telemedicine).  Patients are able to view lab/test results, encounter notes, upcoming appointments, etc.  Non-urgent messages can be sent to your provider as well.   To learn more about what you can do with MyChart, go to [email or web address].    Your next appointment:   1 month(s)  The format for your next appointment:   In Person  Provider:   Lennie Odor, MD

## 2020-02-10 NOTE — Telephone Encounter (Signed)
Enrolled patient for a 3 day Zio XT monitor to be mailed to patients home  

## 2020-02-14 ENCOUNTER — Ambulatory Visit (INDEPENDENT_AMBULATORY_CARE_PROVIDER_SITE_OTHER): Payer: Medicare Other

## 2020-02-14 DIAGNOSIS — R55 Syncope and collapse: Secondary | ICD-10-CM

## 2020-02-21 ENCOUNTER — Telehealth: Payer: Self-pay | Admitting: Cardiovascular Disease

## 2020-02-21 NOTE — Telephone Encounter (Signed)
New Message:    Pt wife wanted Dr Flora Lipps to know pt monitor was mailed back  on 02-17-20. She would like for him to read asap please when the results comes  Back.[name]

## 2020-02-21 NOTE — Telephone Encounter (Signed)
Spoke with wife and notified her that it can take at least 1 week for the monitoring company to import results for our team to then upload to our charting system. Patient has GXT & echo next week. Appointment with Dr. Shari Prows(?) on 9/29. Appointment with Dr. Flora Lipps on 10/7.   Asked wife why patient was to see Dr. Shari Prows and also Dr. Flora Lipps and she thought Dr. Shari Prows would be going over the test results/giving them another opinion. She then states that she did not make the appointment.   Will send to [facility] LPN as Lorain Childes - assistance w/appointment clarification

## 2020-02-22 NOTE — Telephone Encounter (Signed)
Patient appointment with Dr. Shari Prows needs to be cancelled unless they want a new provider. Did they want a new doc?  Gerri Spore T. Flora Lipps, MD [facility]  [address], [address] [place], [place] [address] [phone]  4:56 PM

## 2020-02-22 NOTE — Telephone Encounter (Signed)
I am not sure why he is scheduled with Dr.Pemberton, but he is just to see you correct? Wanted to make sure.   Thank you!

## 2020-02-24 ENCOUNTER — Telehealth: Payer: Self-pay | Admitting: Cardiovascular Disease

## 2020-02-24 ENCOUNTER — Other Ambulatory Visit (HOSPITAL_COMMUNITY)
Admission: RE | Admit: 2020-02-24 | Discharge: 2020-02-24 | Disposition: A | Discharge status: Home / Self Care | Payer: Medicare Other | Source: Ambulatory Visit | Attending: Cardiovascular Disease | Admitting: Cardiovascular Disease

## 2020-02-24 DIAGNOSIS — Z20822 Contact with and (suspected) exposure to covid-19: Secondary | ICD-10-CM | POA: Diagnosis not present

## 2020-02-24 DIAGNOSIS — Z01812 Encounter for preprocedural laboratory examination: Secondary | ICD-10-CM | POA: Diagnosis present

## 2020-02-24 LAB — SARS CORONAVIRUS 2 (TAT 6-24 HRS): SARS Coronavirus 2: NEGATIVE

## 2020-02-24 NOTE — Telephone Encounter (Signed)
Awaiting to get in with Dr.Lambert-

## 2020-02-24 NOTE — Telephone Encounter (Signed)
Received zio patch report which shows sinus pauses up to 8.6 seconds. 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 He is still getting dizzy but no syncope. Plan for urgent EP evaluation early next week and likely ppm implant. Will get report in once uploaded in epic.   Gerri Spore T. Flora Lipps, MD [facility]  [address] [place], [place] [address] [phone]  12:26 PM

## 2020-02-24 NOTE — Telephone Encounter (Signed)
Reference # [id number]

## 2020-02-24 NOTE — Telephone Encounter (Signed)
Follow up:      Patient wife returning a call back. Patient wife states some one called him.

## 2020-02-24 NOTE — Telephone Encounter (Signed)
Incoming call from [employer]. The patient recently wore a 3 day Zio monitor. The representative called to state that the patient had several asystole pauses due to high grade AV Block. The report has been printed and given to Dr. Flora Lipps.  Call placed to the patient and the wife stated that the patient was currently on the phone with Dr. Flora Lipps.

## 2020-02-24 NOTE — Telephone Encounter (Signed)
Please move appointment with Dr.O'Neal to early November instead of October appointment.   Thank you!

## 2020-02-27 ENCOUNTER — Other Ambulatory Visit: Payer: Self-pay

## 2020-02-27 DIAGNOSIS — R55 Syncope and collapse: Secondary | ICD-10-CM

## 2020-02-27 NOTE — Telephone Encounter (Signed)
Patient is scheduled with Dr.Lambert-

## 2020-02-28 ENCOUNTER — Other Ambulatory Visit (HOSPITAL_COMMUNITY): Payer: Medicare Other

## 2020-02-28 NOTE — H&P (View-Only) (Signed)
Electrophysiology Office Note:    Date:  02/29/2020   ID:  [name] [name], DOB March 07, 1941, MRN [id number]  PCP:  Sigmund Hazel, MD  [facility] Cardiologist:  No primary care provider on file.  [facility] Electrophysiologist:  None   Referring MD: Sande Rives, *   Chief Complaint: Bradycardia  History of Present Illness:    [name] [name] is a 79 y.o. male who presents for an evaluation of bradycardia at the request of Dr Flora Lipps. Their medical history includes HTN, SVT, bladder dysfunction requiring self catheterization, tobacco abuse and COPD. He last saw Dr Flora Lipps on 02/10/2020 to discuss syncopal episodes. His first episode was at 4:15am after he let the dog outside. He felt dizzy immediately before passing out but then he woke up on the floor. He had another episode 02/08/2020 when he got up from a seated position. He reported feeling lightheaded and dizzy with any level of exertion.    Past Medical History:  Diagnosis Date  . Colon polyp March 2010   colonoscopy with Dr Randa Evens  . Elevated fasting glucose   . History of BPH    with obstruction and hydronephrosis and slihtly elevated creatinine, using self catherization, followed by urologist Dr McDiarmid  . History of ETT 2012   [name]  . History of tobacco use    54 pack yr  . HTN (hypertension)   . Paroxysmal supraventricular tachycardia (HCC)   . Screening for AAA (abdominal aortic aneurysm) 3/16   3cm, nl/early aneurysm, repeat 3 yrs with u/s 3/19    Past Surgical History:  Procedure Laterality Date  . APPENDECTOMY    . REPLACEMENT TOTAL KNEE BILATERAL  [id number]  . TRANSURETHRAL RESECTION OF PROSTATE  2010    Current Medications: Current Meds  Medication Sig  . amLODipine (NORVASC) 2.5 MG tablet Take 2.5 mg by mouth daily.  [name] atorvastatin (LIPITOR) 10 MG tablet Take 10 mg by mouth daily.  [name] losartan (COZAAR) 100 MG tablet Take 100 mg by mouth daily.  . mometasone-formoterol (DULERA) 100-5  MCG/ACT AERO TAKE 2 PUFFS FIRST THING IN THE MORNING AND THEN ANOTHER 2 PUFFS ABOUT 12 HOURS LATER  . trimethoprim (TRIMPEX) 100 MG tablet Take 100 mg by mouth daily.     Allergies:   Nsaids   Social History   Socioeconomic History  . Marital status: Married    Spouse name: Not on file  . Number of children: Not on file  . Years of education: Not on file  . Highest education level: Not on file  Occupational History  . Occupation: retired  Tobacco Use  . Smoking status: Former Smoker    Packs/day: 3.00    Years: 30.00    Pack years: 90.00    Types: Cigarettes    Quit date: 06/02/1985    Years since quitting: 34.7  . Smokeless tobacco: Never Used  Vaping Use  . Vaping Use: Never used  Substance and Sexual Activity  . Alcohol use: Yes    Alcohol/week: 0.0 standard drinks    Comment: daily wine, occasional vodka  . Drug use: Never  . Sexual activity: Not on file  Other Topics Concern  . Not on file  Social History Narrative   Caffeine: yes   No diet   Exercise: yes   Married to [place]on   2 children   Retired - Education officer, environmental, with extensive traveling   Social Determinants of Corporate investment banker Strain:   . Difficulty of Paying  Living Expenses: Not on file  Food Insecurity:   . Worried About Programme researcher, broadcasting/film/video in the Last Year: Not on file  . Ran Out of Food in the Last Year: Not on file  Transportation Needs:   . Lack of Transportation (Medical): Not on file  . Lack of Transportation (Non-Medical): Not on file  Physical Activity:   . Days of Exercise per Week: Not on file  . Minutes of Exercise per Session: Not on file  Stress:   . Feeling of Stress : Not on file  Social Connections:   . Frequency of Communication with Friends and Family: Not on file  . Frequency of Social Gatherings with Friends and Family: Not on file  . Attends Religious Services: Not on file  . Active Member of Clubs or Organizations: Not on file  . Attends Tax inspector Meetings: Not on file  . Marital Status: Not on file     Family History: The patient's family history includes Healthy in his brother and brother; Kidney disease in his father. There is no history of Colon cancer or Colon polyps.  ROS:   Please see the history of present illness.    All other systems reviewed and are negative.  EKGs/Labs/Other Studies Reviewed:    The following studies were reviewed today: Zio, ecgs  02/24/2020 Zio personally reviewed HR 21-105, average 61bpm. 113 pauses, longest 8.6 seconds in setting of high degree AV block 77 episodes of high degree AV block lasting up to 10:23 Rare ventricular ectopy      02/10/2020 ECG rbbb First degree av delay lafb    EKG:  The ekg ordered today demonstrates right bundle branch block, first degree AV delay and LAFB.   Recent Labs: 02/03/2020: BUN 19; Creatinine, Ser 1.22; Hemoglobin 14.3; Platelets 194; Potassium 4.2; Sodium 138 02/10/2020: TSH 3.030  Recent Lipid Panel No results found for: CHOL, TRIG, HDL, CHOLHDL, VLDL, LDLCALC, LDLDIRECT  Physical Exam:    VS:  BP 130/72   Pulse (!) 56   Ht 6' (1.829 m)   Wt 219 lb 6.4 oz (99.5 kg)   SpO2 96%   BMI 29.76 kg/m     Wt Readings from Last 3 Encounters:  02/29/20 219 lb 6.4 oz (99.5 kg)  02/10/20 214 lb 6.4 oz (97.3 kg)  02/03/20 210 lb (95.3 kg)     GEN:  Well nourished, well developed in no acute distress HEENT: Normal NECK: No JVD; No carotid bruits LYMPHATICS: No lymphadenopathy CARDIAC: RRR, no murmurs, rubs, gallops RESPIRATORY:  Clear to auscultation without rales, wheezing or rhonchi  ABDOMEN: Soft, non-tender, non-distended MUSCULOSKELETAL:  No edema; No deformity  SKIN: Warm and dry NEUROLOGIC:  Alert and oriented x 3 PSYCHIATRIC:  Normal affect   ASSESSMENT:    1. Syncope and collapse   2. Trifascicular block   3. Essential hypertension    PLAN:    In order of problems listed above:  1. Syncope Intermittent high  degree AV block with prolonged pauses > 8 seconds on zio. Recommend DDD PPM.  Discussed the procedure in depth with him and his wife and he would like to proceed. I will need to update his echo prior to the procedure to assess for structural heart disease.   Risks, benefits, alternatives to PPM implantation were discussed in detail with the patient today. The patient understands that the risks include but are not limited to bleeding, infection, pneumothorax, perforation, tamponade, vascular damage, renal failure, MI, stroke, death, and  lead dislodgement and wishes to proceed.  We will therefore schedule device implantation at the next available time.   2. Trifascicular block PPM as above.  3. HTN Controlled today Continue amlodipine and losartan  Medication Adjustments/Labs and Tests Ordered: Current medicines are reviewed at length with the patient today.  Concerns regarding medicines are outlined above.  No orders of the defined types were placed in this encounter.  No orders of the defined types were placed in this encounter.    Signed, Steffanie Dunn, MD, [facility]  02/29/2020 7:49 PM    Electrophysiology [facility]   Anticoagulation instructions: The patient is not on anticoagulation.  Medication instructions morning of: The patient should hold ALL medications the morning of the procedure   Discharge: Our plan will be to discharge the patient same day after a period of observation

## 2020-02-28 NOTE — Progress Notes (Signed)
Electrophysiology Office Note:    Date:  02/29/2020   ID:  [name] [name], DOB March 07, 1941, MRN [id number]  PCP:  Sigmund Hazel, MD  [facility] Cardiologist:  No primary care provider on file.  [facility] Electrophysiologist:  None   Referring MD: Sande Rives, *   Chief Complaint: Bradycardia  History of Present Illness:    [name] [name] is a 79 y.o. male who presents for an evaluation of bradycardia at the request of Dr Flora Lipps. Their medical history includes HTN, SVT, bladder dysfunction requiring self catheterization, tobacco abuse and COPD. He last saw Dr Flora Lipps on 02/10/2020 to discuss syncopal episodes. His first episode was at 4:15am after he let the dog outside. He felt dizzy immediately before passing out but then he woke up on the floor. He had another episode 02/08/2020 when he got up from a seated position. He reported feeling lightheaded and dizzy with any level of exertion.    Past Medical History:  Diagnosis Date  . Colon polyp March 2010   colonoscopy with Dr Randa Evens  . Elevated fasting glucose   . History of BPH    with obstruction and hydronephrosis and slihtly elevated creatinine, using self catherization, followed by urologist Dr McDiarmid  . History of ETT 2012   [name]  . History of tobacco use    54 pack yr  . HTN (hypertension)   . Paroxysmal supraventricular tachycardia (HCC)   . Screening for AAA (abdominal aortic aneurysm) 3/16   3cm, nl/early aneurysm, repeat 3 yrs with u/s 3/19    Past Surgical History:  Procedure Laterality Date  . APPENDECTOMY    . REPLACEMENT TOTAL KNEE BILATERAL  [id number]  . TRANSURETHRAL RESECTION OF PROSTATE  2010    Current Medications: Current Meds  Medication Sig  . amLODipine (NORVASC) 2.5 MG tablet Take 2.5 mg by mouth daily.  [name] atorvastatin (LIPITOR) 10 MG tablet Take 10 mg by mouth daily.  [name] losartan (COZAAR) 100 MG tablet Take 100 mg by mouth daily.  . mometasone-formoterol (DULERA) 100-5  MCG/ACT AERO TAKE 2 PUFFS FIRST THING IN THE MORNING AND THEN ANOTHER 2 PUFFS ABOUT 12 HOURS LATER  . trimethoprim (TRIMPEX) 100 MG tablet Take 100 mg by mouth daily.     Allergies:   Nsaids   Social History   Socioeconomic History  . Marital status: Married    Spouse name: Not on file  . Number of children: Not on file  . Years of education: Not on file  . Highest education level: Not on file  Occupational History  . Occupation: retired  Tobacco Use  . Smoking status: Former Smoker    Packs/day: 3.00    Years: 30.00    Pack years: 90.00    Types: Cigarettes    Quit date: 06/02/1985    Years since quitting: 34.7  . Smokeless tobacco: Never Used  Vaping Use  . Vaping Use: Never used  Substance and Sexual Activity  . Alcohol use: Yes    Alcohol/week: 0.0 standard drinks    Comment: daily wine, occasional vodka  . Drug use: Never  . Sexual activity: Not on file  Other Topics Concern  . Not on file  Social History Narrative   Caffeine: yes   No diet   Exercise: yes   Married to [place]   2 children   Retired - Education officer, environmental, with extensive traveling   Social Determinants of Corporate investment banker Strain:   . Difficulty of Paying  Living Expenses: Not on file  Food Insecurity:   . Worried About Programme researcher, broadcasting/film/video in the Last Year: Not on file  . Ran Out of Food in the Last Year: Not on file  Transportation Needs:   . Lack of Transportation (Medical): Not on file  . Lack of Transportation (Non-Medical): Not on file  Physical Activity:   . Days of Exercise per Week: Not on file  . Minutes of Exercise per Session: Not on file  Stress:   . Feeling of Stress : Not on file  Social Connections:   . Frequency of Communication with Friends and Family: Not on file  . Frequency of Social Gatherings with Friends and Family: Not on file  . Attends Religious Services: Not on file  . Active Member of Clubs or Organizations: Not on file  . Attends Tax inspector Meetings: Not on file  . Marital Status: Not on file     Family History: The patient's family history includes Healthy in his brother and brother; Kidney disease in his father. There is no history of Colon cancer or Colon polyps.  ROS:   Please see the history of present illness.    All other systems reviewed and are negative.  EKGs/Labs/Other Studies Reviewed:    The following studies were reviewed today: Zio, ecgs  02/24/2020 Zio personally reviewed HR 21-105, average 61bpm. 113 pauses, longest 8.6 seconds in setting of high degree AV block 77 episodes of high degree AV block lasting up to 10:23 Rare ventricular ectopy      02/10/2020 ECG rbbb First degree av delay lafb    EKG:  The ekg ordered today demonstrates right bundle branch block, first degree AV delay and LAFB.   Recent Labs: 02/03/2020: BUN 19; Creatinine, Ser 1.22; Hemoglobin 14.3; Platelets 194; Potassium 4.2; Sodium 138 02/10/2020: TSH 3.030  Recent Lipid Panel No results found for: CHOL, TRIG, HDL, CHOLHDL, VLDL, LDLCALC, LDLDIRECT  Physical Exam:    VS:  BP 130/72   Pulse (!) 56   Ht 6' (1.829 m)   Wt 219 lb 6.4 oz (99.5 kg)   SpO2 96%   BMI 29.76 kg/m     Wt Readings from Last 3 Encounters:  02/29/20 219 lb 6.4 oz (99.5 kg)  02/10/20 214 lb 6.4 oz (97.3 kg)  02/03/20 210 lb (95.3 kg)     GEN:  Well nourished, well developed in no acute distress HEENT: Normal NECK: No JVD; No carotid bruits LYMPHATICS: No lymphadenopathy CARDIAC: RRR, no murmurs, rubs, gallops RESPIRATORY:  Clear to auscultation without rales, wheezing or rhonchi  ABDOMEN: Soft, non-tender, non-distended MUSCULOSKELETAL:  No edema; No deformity  SKIN: Warm and dry NEUROLOGIC:  Alert and oriented x 3 PSYCHIATRIC:  Normal affect   ASSESSMENT:    1. Syncope and collapse   2. Trifascicular block   3. Essential hypertension    PLAN:    In order of problems listed above:  1. Syncope Intermittent high  degree AV block with prolonged pauses > 8 seconds on zio. Recommend DDD PPM.  Discussed the procedure in depth with him and his wife and he would like to proceed. I will need to update his echo prior to the procedure to assess for structural heart disease.   Risks, benefits, alternatives to PPM implantation were discussed in detail with the patient today. The patient understands that the risks include but are not limited to bleeding, infection, pneumothorax, perforation, tamponade, vascular damage, renal failure, MI, stroke, death, and  lead dislodgement and wishes to proceed.  We will therefore schedule device implantation at the next available time.   2. Trifascicular block PPM as above.  3. HTN Controlled today Continue amlodipine and losartan  Medication Adjustments/Labs and Tests Ordered: Current medicines are reviewed at length with the patient today.  Concerns regarding medicines are outlined above.  No orders of the defined types were placed in this encounter.  No orders of the defined types were placed in this encounter.    Signed, Steffanie Dunn, MD, [facility]  02/29/2020 7:49 PM    Electrophysiology [facility]   Anticoagulation instructions: The patient is not on anticoagulation.  Medication instructions morning of: The patient should hold ALL medications the morning of the procedure   Discharge: Our plan will be to discharge the patient same day after a period of observation

## 2020-02-29 ENCOUNTER — Other Ambulatory Visit: Payer: Self-pay

## 2020-02-29 ENCOUNTER — Ambulatory Visit: Payer: Medicare Other | Admitting: Cardiology

## 2020-02-29 ENCOUNTER — Encounter: Payer: Self-pay | Admitting: Cardiology

## 2020-02-29 ENCOUNTER — Ambulatory Visit (INDEPENDENT_AMBULATORY_CARE_PROVIDER_SITE_OTHER): Payer: Medicare Other | Admitting: Cardiology

## 2020-02-29 VITALS — BP 130/72 | HR 56 | Ht 72.0 in | Wt 219.4 lb

## 2020-02-29 DIAGNOSIS — R55 Syncope and collapse: Secondary | ICD-10-CM

## 2020-02-29 DIAGNOSIS — I10 Essential (primary) hypertension: Secondary | ICD-10-CM

## 2020-02-29 DIAGNOSIS — I453 Trifascicular block: Secondary | ICD-10-CM

## 2020-02-29 DIAGNOSIS — R001 Bradycardia, unspecified: Secondary | ICD-10-CM | POA: Insufficient documentation

## 2020-02-29 NOTE — Patient Instructions (Addendum)
Medication Instructions:  Your physician recommends that you continue on your current medications as directed. Please refer to the Current Medication list given to you today.  Labwork: None ordered.  Testing/Procedures: None ordered.  Follow-Up:  SEE INSTRUCTION LETTER   Any Other Special Instructions Will Be Listed Below (If Applicable).  If you need a refill on your cardiac medications before your next appointment, please call your pharmacy.    Pacemaker Implantation, Adult Pacemaker implantation is a procedure to place a pacemaker inside your chest. A pacemaker is a small computer that sends electrical signals to the heart and helps your heart beat normally. A pacemaker also stores information about your heart rhythms. You may need pacemaker implantation if you:  Have a slow heartbeat (bradycardia).  Faint (syncope).  Have shortness of breath (dyspnea) due to heart problems. The pacemaker attaches to your heart through a wire, called a lead. Sometimes just one lead is needed. Other times, there will be two leads. There are two types of pacemakers:  Transvenous pacemaker. This type is placed under the skin or muscle of your chest. The lead goes through a vein in the chest area to reach the inside of the heart.  Epicardial pacemaker. This type is placed under the skin or muscle of your chest or belly. The lead goes through your chest to the outside of the heart. Tell a health care provider about:  Any allergies you have.  All medicines you are taking, including vitamins, herbs, eye drops, creams, and over-the-counter medicines.  Any problems you or family members have had with anesthetic medicines.  Any blood or bone disorders you have.  Any surgeries you have had.  Any medical conditions you have.  Whether you are pregnant or may be pregnant. What are the risks? Generally, this is a safe procedure. However, problems may occur,  including:  Infection.  Bleeding.  Failure of the pacemaker or the lead.  Collapse of a lung or bleeding into a lung.  Blood clot inside a blood vessel with a lead.  Damage to the heart.  Infection inside the heart (endocarditis).  Allergic reactions to medicines. What happens before the procedure? Staying hydrated Follow instructions from your health care provider about hydration, which may include:  Up to 2 hours before the procedure - you may continue to drink clear liquids, such as water, clear fruit juice, black coffee, and plain tea. Eating and drinking restrictions Follow instructions from your health care provider about eating and drinking, which may include:  8 hours before the procedure - stop eating heavy meals or foods such as meat, fried foods, or fatty foods.  6 hours before the procedure - stop eating light meals or foods, such as toast or cereal.  6 hours before the procedure - stop drinking milk or drinks that contain milk.  2 hours before the procedure - stop drinking clear liquids. Medicines  Ask your health care provider about: ? Changing or stopping your regular medicines. This is especially important if you are taking diabetes medicines or blood thinners. ? Taking medicines such as aspirin and ibuprofen. These medicines can thin your blood. Do not take these medicines before your procedure if your health care provider instructs you not to.  You may be given antibiotic medicine to help prevent infection. General instructions  You will have a heart evaluation. This may include an electrocardiogram (ECG), chest X-ray, and heart imaging (echocardiogram,  or echo) tests.  You will have blood tests.  Do not use any   products that contain nicotine or tobacco, such as cigarettes and e-cigarettes. If you need help quitting, ask your health care provider.  Plan to have someone take you home from the hospital or clinic.  If you will be going home right after  the procedure, plan to have someone with you for 24 hours.  Ask your health care provider how your surgical site will be marked or identified. What happens during the procedure?  To reduce your risk of infection: ? Your health care team will wash or sanitize their hands. ? Your skin will be washed with soap. ? Hair may be removed from the surgical area.  An IV tube will be inserted into one of your veins.  You will be given one or more of the following: ? A medicine to help you relax (sedative). ? A medicine to numb the area (local anesthetic). ? A medicine to make you fall asleep (general anesthetic).  If you are getting a transvenous pacemaker: ? An incision will be made in your upper chest. ? A pocket will be made for the pacemaker. It may be placed under the skin or between layers of muscle. ? The lead will be inserted into a blood vessel that returns to the heart. ? While X-rays are taken by an imaging machine (fluoroscopy), the lead will be advanced through the vein to the inside of your heart. ? The other end of the lead will be tunneled under the skin and attached to the pacemaker.  If you are getting an epicardial pacemaker: ? An incision will be made near your ribs or breastbone (sternum) for the lead. ? The lead will be attached to the outside of your heart. ? Another incision will be made in your chest or upper belly to create a pocket for the pacemaker. ? The free end of the lead will be tunneled under the skin and attached to the pacemaker.  The transvenous or epicardial pacemaker will be tested. Imaging studies may be done to check the lead position.  The incisions will be closed with stitches (sutures), adhesive strips, or skin glue.  Bandages (dressing) will be placed over the incisions. The procedure may vary among health care providers and hospitals. What happens after the procedure?  Your blood pressure, heart rate, breathing rate, and blood oxygen level will  be monitored until the medicines you were given have worn off.  You will be given antibiotics and pain medicine.  ECG and chest x-rays will be done.  You will wear a continuous type of ECG (Holter monitor) to check your heart rhythm.  Your health care provider will program the pacemaker.  Do not drive for 24 hours if you received a sedative. This information is not intended to replace advice given to you by your health care provider. Make sure you discuss any questions you have with your health care provider. Document Revised: 02/05/2018 Document Reviewed: 10/31/2015 Elsevier Patient Education  2020 Elsevier Inc[employer]   

## 2020-03-01 ENCOUNTER — Other Ambulatory Visit (HOSPITAL_COMMUNITY)
Admission: RE | Admit: 2020-03-01 | Discharge: 2020-03-01 | Disposition: A | Discharge status: Home / Self Care | Payer: Medicare Other | Source: Ambulatory Visit | Attending: Cardiology | Admitting: Cardiology

## 2020-03-01 DIAGNOSIS — Z20822 Contact with and (suspected) exposure to covid-19: Secondary | ICD-10-CM | POA: Diagnosis not present

## 2020-03-01 DIAGNOSIS — Z01812 Encounter for preprocedural laboratory examination: Secondary | ICD-10-CM | POA: Diagnosis present

## 2020-03-01 LAB — SARS CORONAVIRUS 2 (TAT 6-24 HRS): SARS Coronavirus 2: NEGATIVE

## 2020-03-02 ENCOUNTER — Other Ambulatory Visit: Payer: Self-pay

## 2020-03-02 ENCOUNTER — Ambulatory Visit (HOSPITAL_COMMUNITY): Payer: Medicare Other

## 2020-03-02 ENCOUNTER — Ambulatory Visit (HOSPITAL_BASED_OUTPATIENT_CLINIC_OR_DEPARTMENT_OTHER): Payer: Medicare Other

## 2020-03-02 ENCOUNTER — Ambulatory Visit (HOSPITAL_COMMUNITY)
Admission: RE | Admit: 2020-03-02 | Discharge: 2020-03-02 | Disposition: A | Discharge status: Home / Self Care | Payer: Medicare Other | Attending: Cardiology | Admitting: Cardiology

## 2020-03-02 ENCOUNTER — Encounter (HOSPITAL_COMMUNITY)
Admission: RE | Disposition: A | Discharge status: Home / Self Care | Payer: Self-pay | Source: Home / Self Care | Attending: Cardiology

## 2020-03-02 DIAGNOSIS — J449 Chronic obstructive pulmonary disease, unspecified: Secondary | ICD-10-CM | POA: Diagnosis not present

## 2020-03-02 DIAGNOSIS — Z79899 Other long term (current) drug therapy: Secondary | ICD-10-CM | POA: Diagnosis not present

## 2020-03-02 DIAGNOSIS — I442 Atrioventricular block, complete: Secondary | ICD-10-CM | POA: Diagnosis not present

## 2020-03-02 DIAGNOSIS — I714 Abdominal aortic aneurysm, without rupture: Secondary | ICD-10-CM | POA: Insufficient documentation

## 2020-03-02 DIAGNOSIS — Z87891 Personal history of nicotine dependence: Secondary | ICD-10-CM | POA: Insufficient documentation

## 2020-03-02 DIAGNOSIS — R55 Syncope and collapse: Secondary | ICD-10-CM

## 2020-03-02 DIAGNOSIS — I453 Trifascicular block: Secondary | ICD-10-CM | POA: Insufficient documentation

## 2020-03-02 DIAGNOSIS — R001 Bradycardia, unspecified: Secondary | ICD-10-CM | POA: Diagnosis not present

## 2020-03-02 DIAGNOSIS — Z950 Presence of cardiac pacemaker: Secondary | ICD-10-CM

## 2020-03-02 DIAGNOSIS — I10 Essential (primary) hypertension: Secondary | ICD-10-CM | POA: Insufficient documentation

## 2020-03-02 HISTORY — PX: PACEMAKER IMPLANT: EP1218

## 2020-03-02 LAB — ECHOCARDIOGRAM COMPLETE
Area-P 1/2: 2.08 cm2
S' Lateral: 3 cm

## 2020-03-02 SURGERY — PACEMAKER IMPLANT

## 2020-03-02 MED ORDER — ACETAMINOPHEN 325 MG PO TABS
325.0000 mg | ORAL_TABLET | ORAL | Status: DC | PRN
  Filled 2020-03-02: qty 2

## 2020-03-02 MED ORDER — SODIUM CHLORIDE 0.9 % IV SOLN
80.0000 mg | INTRAVENOUS | Status: AC
  Administered 2020-03-02: 80 mg

## 2020-03-02 MED ORDER — HEPARIN (PORCINE) IN NACL 1000-0.9 UT/500ML-% IV SOLN
INTRAVENOUS | Status: DC | PRN
  Administered 2020-03-02: 500 mL

## 2020-03-02 MED ORDER — CHLORHEXIDINE GLUCONATE 4 % EX LIQD: 4.0000 "application " | Freq: Once | CUTANEOUS | Status: DC

## 2020-03-02 MED ORDER — CEFAZOLIN SODIUM-DEXTROSE 2-4 GM/100ML-% IV SOLN
INTRAVENOUS | Status: AC
  Filled 2020-03-02: qty 100

## 2020-03-02 MED ORDER — SODIUM CHLORIDE 0.9 % IV SOLN
INTRAVENOUS | Status: AC
  Filled 2020-03-02: qty 2

## 2020-03-02 MED ORDER — FENTANYL CITRATE (PF) 100 MCG/2ML IJ SOLN
INTRAMUSCULAR | Status: DC | PRN
Start: 2020-03-02 — End: 2020-03-02
  Administered 2020-03-02 (×4): 25 ug via INTRAVENOUS

## 2020-03-02 MED ORDER — MIDAZOLAM HCL 5 MG/5ML IJ SOLN
INTRAMUSCULAR | Status: AC
  Filled 2020-03-02: qty 5

## 2020-03-02 MED ORDER — SODIUM CHLORIDE 0.9 % IV SOLN: INTRAVENOUS | Status: DC

## 2020-03-02 MED ORDER — CEFAZOLIN SODIUM-DEXTROSE 2-4 GM/100ML-% IV SOLN
2.0000 g | INTRAVENOUS | Status: AC
  Administered 2020-03-02: 2 g via INTRAVENOUS

## 2020-03-02 MED ORDER — HEPARIN (PORCINE) IN NACL 1000-0.9 UT/500ML-% IV SOLN
INTRAVENOUS | Status: AC
  Filled 2020-03-02: qty 500

## 2020-03-02 MED ORDER — LIDOCAINE HCL (PF) 1 % IJ SOLN
INTRAMUSCULAR | Status: AC
  Filled 2020-03-02: qty 60

## 2020-03-02 MED ORDER — FENTANYL CITRATE (PF) 100 MCG/2ML IJ SOLN
INTRAMUSCULAR | Status: AC
  Filled 2020-03-02: qty 2

## 2020-03-02 MED ORDER — MIDAZOLAM HCL 5 MG/5ML IJ SOLN
INTRAMUSCULAR | Status: DC | PRN
  Administered 2020-03-02 (×4): 1 mg via INTRAVENOUS

## 2020-03-02 MED ORDER — LIDOCAINE HCL (PF) 1 % IJ SOLN
INTRAMUSCULAR | Status: DC | PRN
  Administered 2020-03-02: 60 mL

## 2020-03-02 MED ORDER — ONDANSETRON HCL 4 MG/2ML IJ SOLN: 4.0000 mg | Freq: Four times a day (QID) | INTRAMUSCULAR | Status: DC | PRN

## 2020-03-02 SURGICAL SUPPLY — 9 items
CABLE SURGICAL S-101-97-12 (CABLE) ×2 IMPLANT
IPG PACE AZUR XT DR MRI W1DR01 (Pacemaker) IMPLANT
LEAD CAPSURE NOVUS 5076-52CM (Lead) ×1 IMPLANT
LEAD CAPSURE NOVUS 5076-58CM (Lead) ×1 IMPLANT
PACE AZURE XT DR MRI W1DR01 (Pacemaker) ×2 IMPLANT
PAD PRO RADIOLUCENT 2001M-C (PAD) ×2 IMPLANT
SHEATH 7FR PRELUDE SNAP 13 (SHEATH) ×2 IMPLANT
SHEATH PROBE COVER 6X72 (BAG) ×1 IMPLANT
TRAY PACEMAKER INSERTION (PACKS) ×2 IMPLANT

## 2020-03-02 NOTE — Addendum Note (Signed)
Addended by: Winifred Olive on: 03/02/2020 10:17 AM   Modules accepted: Orders

## 2020-03-02 NOTE — Progress Notes (Signed)
Patient and wife was given discharge instructions. Both verbalize understanding.

## 2020-03-02 NOTE — Interval H&P Note (Signed)
History and Physical Interval Note:  03/02/2020 1:13 PM  [name] [name]  has presented today for surgery, with the diagnosis of bradicardia.  The various methods of treatment have been discussed with the patient and family. After consideration of risks, benefits and other options for treatment, the patient has consented to  Procedure(s): PACEMAKER IMPLANT (N/A) as a surgical intervention.  The patient's history has been reviewed, patient examined, no change in status, stable for surgery.  I have reviewed the patient's chart and labs.  Questions were answered to the patient's satisfaction.     Yanni Ruberg T Zeven Kocak

## 2020-03-02 NOTE — Discharge Instructions (Signed)
    Supplemental Discharge Instructions for  Pacemaker/Defibrillator Patients  Tomorrow, 03/03/2020, send in a device transmission  Activity No heavy lifting or vigorous activity with your left/right arm for 6 to 8 weeks.  Do not raise your left/right arm above your head for one week.  Gradually raise your affected arm as drawn below.             03/06/2020                 03/07/2020                03/08/2020              03/09/2020 __  NO DRIVING for  1 week   ; you may begin driving on  [address]  .  WOUND CARE - Keep the wound area clean and dry.  Do not get this area wet , no showers for one week; you may shower on     . - Tomorrow, 03/03/2020, remove the arm sling - Tomorrow, 03/03/2020 remove the outer plastic bandage.  Underneath the plastic bandage there are steris strips (paper tapes), DO NOT remove these. - The tape/steri-strips on your wound will fall off; do not pull them off.  No bandage is needed on the site.  DO  NOT apply any creams, oils, or ointments to the wound area. - If you notice any drainage or discharge from the wound, any swelling or bruising at the site, or you develop a fever > 101? F after you are discharged home, call the office at once.  Special Instructions - You are still able to use cellular telephones; use the ear opposite the side where you have your pacemaker/defibrillator.  Avoid carrying your cellular phone near your device. - When traveling through airports, show security personnel your identification card to avoid being screened in the metal detectors.  Ask the security personnel to use the hand wand. - Avoid arc welding equipment, MRI testing (magnetic resonance imaging), TENS units (transcutaneous nerve stimulators).  Call the office for questions about other devices. - Avoid electrical appliances that are in poor condition or are not properly grounded. - Microwave ovens are safe to be near or to operate.

## 2020-03-05 ENCOUNTER — Encounter (HOSPITAL_COMMUNITY): Payer: Self-pay | Admitting: Cardiology

## 2020-03-05 ENCOUNTER — Telehealth: Payer: Self-pay

## 2020-03-05 NOTE — Telephone Encounter (Signed)
-----   Message from Sheilah Pigeon, [place] sent at 03/02/2020  2:48 PM EDT ----- Same day d/c  MDT PPM CL

## 2020-03-05 NOTE — Telephone Encounter (Signed)
Attempted to reach pt for same day discharge f/u.  No answer.    Follow-up after same day discharge: Implant date: 03/02/20 MD: Dr. Lalla Brothers  Device: Medtronic PPM  Location: Left chest   Wound check visit: 03/15/20 90 day MD follow-up: 06/07/19  Remote Transmission received:yes on 10/1 (carelink)  Dressing removed: By patient, reports no issues with site.

## 2020-03-08 ENCOUNTER — Ambulatory Visit: Payer: Medicare Other | Admitting: Cardiovascular Disease

## 2020-03-15 ENCOUNTER — Other Ambulatory Visit: Payer: Self-pay

## 2020-03-15 ENCOUNTER — Ambulatory Visit (INDEPENDENT_AMBULATORY_CARE_PROVIDER_SITE_OTHER): Payer: Medicare Other | Admitting: Emergency Medicine

## 2020-03-15 DIAGNOSIS — R001 Bradycardia, unspecified: Secondary | ICD-10-CM

## 2020-03-15 LAB — CUP PACEART INCLINIC DEVICE CHECK
Battery Remaining Longevity: 123 mo
Battery Voltage: 3.21 V
Brady Statistic AP VP Percent: 66.53 %
Brady Statistic AP VS Percent: 0.01 %
Brady Statistic AS VP Percent: 33.41 %
Brady Statistic AS VS Percent: 0.05 %
Brady Statistic RA Percent Paced: 66.51 %
Brady Statistic RV Percent Paced: 99.93 %
Date Time Interrogation Session: 20211014120955
Implantable Lead Implant Date: 20211001
Implantable Lead Implant Date: 20211001
Implantable Lead Location: 753859
Implantable Lead Location: 753860
Implantable Lead Model: 5076
Implantable Lead Model: 5076
Implantable Pulse Generator Implant Date: 20211001
Lead Channel Impedance Value: 342 Ohm
Lead Channel Impedance Value: 418 Ohm
Lead Channel Impedance Value: 418 Ohm
Lead Channel Impedance Value: 513 Ohm
Lead Channel Pacing Threshold Amplitude: 0.75 V
Lead Channel Pacing Threshold Amplitude: 0.75 V
Lead Channel Pacing Threshold Pulse Width: 0.4 ms
Lead Channel Pacing Threshold Pulse Width: 0.4 ms
Lead Channel Sensing Intrinsic Amplitude: 1.9 mV
Lead Channel Sensing Intrinsic Amplitude: 2 mV
Lead Channel Setting Pacing Amplitude: 3.5 V
Lead Channel Setting Pacing Amplitude: 3.5 V
Lead Channel Setting Pacing Pulse Width: 0.4 ms
Lead Channel Setting Sensing Sensitivity: 0.9 mV

## 2020-03-15 NOTE — Progress Notes (Signed)
Wound check appointment. Steri-strips removed by patient prior to OV. Wound without redness or edema. Incision edges approximated, wound well healed. Normal device function. Thresholds, sensing, and impedances consistent with implant measurements. Device programmed at 3.5V/auto capture programmed on for extra safety margin until 3 month visit. Histogram distribution appropriate for patient and level of activity. AT/AF burden <0.1%, no EGMS available. No high ventricular rates noted. Patient educated about wound care, arm mobility, lifting restrictions. ROV in 3 months with Dr. Lalla Brothers. Next home remote 06/04/20.

## 2020-03-19 ENCOUNTER — Ambulatory Visit: Payer: Medicare Other | Admitting: Cardiovascular Disease

## 2020-04-01 NOTE — Progress Notes (Signed)
Cardiology Office Note:   Date:  04/02/2020  NAME:  [name] [name]    MRN: [id number] DOB:  1941/02/16   PCP:  Sigmund Hazel, MD  Cardiologist:  No primary care provider on file.   Referring MD: Sigmund Hazel, MD   Chief Complaint  Patient presents with  . Follow-up   History of Present Illness:   CHIEF WALKUP is a 79 y.o. male with a hx of COPD, HTN, SSS/high grade AV conduction disease who presents for follow-up. Underwent PPM placement 03/02/2020. Follow-up today.   He reports he is doing well.  No further passing out spells.  Is not having any dizziness or lightheadedness either.  It appears that his main issue was high-grade AV conduction disease and symptoms have resolved after pacemaker implantation.  Blood pressure is 132/77.  He reports he would like to get back to swimming and exercising.  Have asked him to reach out to electrophysiology for clearance on this.  He denies any chest pain or shortness of breath.  Blood pressure is well controlled on current medications.  Most recent cholesterol level shows his LDL cholesterol is 49.  Overall doing remarkably well.  1. Problem List 1. COPD -mild obstructive disease 2. HTN 3. Tobacco abuse  4. Bladder dysfunction -self catheterization (4-5 years) 5. 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 HLD -Total cholesterol 123, HDL 62, LDL 49, triglycerides 54  Past Medical History: Past Medical History:  Diagnosis Date  . Colon polyp March 2010   colonoscopy with Dr Randa Evens  . Elevated fasting glucose   . History of BPH    with obstruction and hydronephrosis and slihtly elevated creatinine, using self catherization, followed by urologist Dr McDiarmid  . History of ETT 2012   [name]  . History of tobacco use    54 pack yr  . HTN (hypertension)   . Paroxysmal supraventricular tachycardia (HCC)   . Screening for AAA (abdominal aortic aneurysm) 3/16   3cm, nl/early aneurysm, repeat 3 yrs with u/s 3/19     Past Surgical History: Past Surgical History:  Procedure Laterality Date  . APPENDECTOMY    . PACEMAKER IMPLANT N/A 03/02/2020   Procedure: PACEMAKER IMPLANT;  Surgeon: Lanier Prude, MD;  Location: [facility] INVASIVE CV LAB;  Service: Cardiovascular;  Laterality: N/A;  . REPLACEMENT TOTAL KNEE BILATERAL  [id number]  . TRANSURETHRAL RESECTION OF PROSTATE  2010    Current Medications: Current Meds  Medication Sig  . atorvastatin (LIPITOR) 10 MG tablet Take 10 mg by mouth daily.  . ciprofloxacin (CIPRO) 500 MG tablet Take 500 mg by mouth 2 (two) times daily.  [name] losartan (COZAAR) 100 MG tablet Take 100 mg by mouth daily.  . mometasone-formoterol (DULERA) 100-5 MCG/ACT AERO TAKE 2 PUFFS FIRST THING IN THE MORNING AND THEN ANOTHER 2 PUFFS ABOUT 12 HOURS LATER (Patient taking differently: Inhale 2 puffs into the lungs in the morning and at bedtime. )  . trimethoprim (TRIMPEX) 100 MG tablet Take 100 mg by mouth daily.  . [DISCONTINUED] amLODipine (NORVASC) 2.5 MG tablet Take 2.5 mg by mouth daily.  . [DISCONTINUED] Multiple Vitamins-Minerals (MULTIVITAMIN WITH MINERALS) tablet Take 1 tablet by mouth 2 (two) times a week.     Allergies:    Nsaids   Social History: Social History   Socioeconomic History  . Marital status: Married    Spouse name: Not on file  . Number of children: Not on file  . Years of education: Not on file  .  Highest education level: Not on file  Occupational History  . Occupation: retired  Tobacco Use  . Smoking status: Former Smoker    Packs/day: 3.00    Years: 30.00    Pack years: 90.00    Types: Cigarettes    Quit date: 06/02/1985    Years since quitting: 34.8  . Smokeless tobacco: Never Used  Vaping Use  . Vaping Use: Never used  Substance and Sexual Activity  . Alcohol use: Yes    Alcohol/week: 0.0 standard drinks    Comment: daily wine, occasional vodka  . Drug use: Never  . Sexual activity: Not on file  Other Topics Concern  . Not on file   Social History Narrative   Caffeine: yes   No diet   Exercise: yes   Married to [place]   2 children   Retired - Education officer, environmental, with extensive traveling   Social Determinants of Corporate investment banker Strain:   . Difficulty of Paying Living Expenses: Not on file  Food Insecurity:   . Worried About Programme researcher, broadcasting/film/video in the Last Year: Not on file  . Ran Out of Food in the Last Year: Not on file  Transportation Needs:   . Lack of Transportation (Medical): Not on file  . Lack of Transportation (Non-Medical): Not on file  Physical Activity:   . Days of Exercise per Week: Not on file  . Minutes of Exercise per Session: Not on file  Stress:   . Feeling of Stress : Not on file  Social Connections:   . Frequency of Communication with Friends and Family: Not on file  . Frequency of Social Gatherings with Friends and Family: Not on file  . Attends Religious Services: Not on file  . Active Member of Clubs or Organizations: Not on file  . Attends Banker Meetings: Not on file  . Marital Status: Not on file     Family History: The patient's family history includes Healthy in his brother and brother; Kidney disease in his father. There is no history of Colon cancer or Colon polyps.  ROS:   All other ROS reviewed and negative. Pertinent positives noted in the HPI.     EKGs/Labs/Other Studies Reviewed:   The following studies were personally reviewed by me today:  TTE 03/02/2020 1. Left ventricular ejection fraction, by estimation, is 60 to 65%. The  left ventricle has normal function. The left ventricle has no regional  wall motion abnormalities. Left ventricular diastolic parameters are  indeterminate.  2. Right ventricular systolic function is normal. The right ventricular  size is normal.  3. The mitral valve is normal in structure. Mild mitral valve  regurgitation.  4. The aortic valve is tricuspid. Aortic valve regurgitation is not  visualized.  No aortic stenosis is present.  5. Aortic dilatation noted. There is mild to moderate dilatation of the  ascending aorta, measuring 42 mm.  6. The inferior vena cava is normal in size with greater than 50%  respiratory variability, suggesting right atrial pressure of 3 mmHg.   Recent Labs: 02/03/2020: BUN 19; Creatinine, Ser 1.22; Hemoglobin 14.3; Platelets 194; Potassium 4.2; Sodium 138 02/10/2020: TSH 3.030   Recent Lipid Panel No results found for: CHOL, TRIG, HDL, CHOLHDL, VLDL, LDLCALC, LDLDIRECT  Physical Exam:   VS:  BP 132/77   Pulse 74   Ht 6' (1.829 m)   Wt 215 lb 12.8 oz (97.9 kg)   SpO2 98%   BMI 29.27 kg/m  Wt Readings from Last 3 Encounters:  04/02/20 215 lb 12.8 oz (97.9 kg)  03/02/20 215 lb (97.5 kg)  02/29/20 219 lb 6.4 oz (99.5 kg)    General: Well nourished, well developed, in no acute distress Heart: Atraumatic, normal size  Eyes: PEERLA, EOMI  Neck: Supple, no JVD Endocrine: No thryomegaly Cardiac: Normal S1, S2; RRR; no murmurs, rubs, or gallops Lungs: Clear to auscultation bilaterally, no wheezing, rhonchi or rales  Abd: Soft, nontender, no hepatomegaly  Ext: No edema, pulses 2+ Musculoskeletal: No deformities, BUE and BLE strength normal and equal Skin: Warm and dry, no rashes   Neuro: Alert and oriented to person, place, time, and situation, CNII-XII grossly intact, no focal deficits  Psych: Normal mood and affect   ASSESSMENT:   Ananias PilgrimLarry C Villers is a [age] y.o. male who presents for the following: 1. Trifascicular block   2. Pacemaker   3. Syncope and collapse   4. Aortic root dilation (HCC)     PLAN:   1. Trifascicular block 2. Pacemaker 3. Syncope and collapse -He had a Zio patch with high-grade AV conduction disease.  Symptoms were all attributable to this.  Symptoms have resolved after pacemaker implantation.  Overall doing well without any major heart issues.  I think he can follow with electrophysiology moving forward.  He has no other  major cardiac issues.  I will see him as needed.  If he has any other cardiac issues that develop he will see me.  4. Aortic root dilation (HCC) -Aorta noted to be 42 mm on echo.  Given his age of 79 years, this is likely normal.  Tricuspid aortic valve.  I see no need for further evaluation of this.  Disposition: Return if symptoms worsen or fail to improve.  Medication Adjustments/Labs and Tests Ordered: Current medicines are reviewed at length with the patient today.  Concerns regarding medicines are outlined above.  No orders of the defined types were placed in this encounter.  No orders of the defined types were placed in this encounter.   Patient Instructions  Medication Instructions:  Your physician recommends that you continue on your current medications as directed. Please refer to the Current Medication list given to you today.  *If you need a refill on your cardiac medications before your next appointment, please call your pharmacy*  Lab Work: NONE ordered at this time of appointment   If you have labs (blood work) drawn today and your tests are completely normal, you will receive your results only by: [name]. MyChart Message (if you have MyChart) OR . A paper copy in the mail If you have any lab test that is abnormal or we need to change your treatment, we will call you to review the results.  Testing/Procedures: NONE ordered at this time of appointment   Follow-Up: At [facility], you and your health needs are our priority.  As part of our continuing mission to provide you with exceptional heart care, we have created designated Provider Care Teams.  These Care Teams include your primary Cardiologist (physician) and Advanced Practice Providers (APPs -  Physician Assistants and Nurse Practitioners) who all work together to provide you with the care you need, when you need it.  Your next appointment:   Follow up with Dr. Flora Lipps'Neal AS NEEDED    The format for your next  appointment:   In Person  Provider:   Lennie OdorWesley O'Neal, MD  Other Instructions      Time Spent with Patient: I  have spent a total of 25 minutes with patient reviewing hospital notes, telemetry, EKGs, labs and examining the patient as well as establishing an assessment and plan that was discussed with the patient.  > 50% of time was spent in direct patient care.  Signed, Lenna Gilford. Flora Lipps, MD [facility]  [address], [address] [place], [place] [address] [phone]  04/02/2020 9:45 AM

## 2020-04-02 ENCOUNTER — Ambulatory Visit (INDEPENDENT_AMBULATORY_CARE_PROVIDER_SITE_OTHER): Payer: Medicare Other | Admitting: Cardiovascular Disease

## 2020-04-02 ENCOUNTER — Other Ambulatory Visit: Payer: Self-pay

## 2020-04-02 ENCOUNTER — Encounter: Payer: Self-pay | Admitting: Cardiovascular Disease

## 2020-04-02 VITALS — BP 132/77 | HR 74 | Ht 72.0 in | Wt 215.8 lb

## 2020-04-02 DIAGNOSIS — I453 Trifascicular block: Secondary | ICD-10-CM

## 2020-04-02 DIAGNOSIS — I77810 Thoracic aortic ectasia: Secondary | ICD-10-CM

## 2020-04-02 DIAGNOSIS — R55 Syncope and collapse: Secondary | ICD-10-CM | POA: Diagnosis not present

## 2020-04-02 DIAGNOSIS — Z950 Presence of cardiac pacemaker: Secondary | ICD-10-CM

## 2020-04-02 NOTE — Patient Instructions (Signed)
Medication Instructions:  Your physician recommends that you continue on your current medications as directed. Please refer to the Current Medication list given to you today.  *If you need a refill on your cardiac medications before your next appointment, please call your pharmacy*  Lab Work: NONE ordered at this time of appointment   If you have labs (blood work) drawn today and your tests are completely normal, you will receive your results only by: [name] MyChart Message (if you have MyChart) OR . A paper copy in the mail If you have any lab test that is abnormal or we need to change your treatment, we will call you to review the results.  Testing/Procedures: NONE ordered at this time of appointment   Follow-Up: At [facility], you and your health needs are our priority.  As part of our continuing mission to provide you with exceptional heart care, we have created designated Provider Care Teams.  These Care Teams include your primary Cardiologist (physician) and Advanced Practice Providers (APPs -  Physician Assistants and Nurse Practitioners) who all work together to provide you with the care you need, when you need it.  Your next appointment:   Follow up with Dr. Flora Lipps AS NEEDED    The format for your next appointment:   In Person  Provider:   Lennie Odor, MD  Other Instructions

## 2020-04-03 ENCOUNTER — Telehealth: Payer: Self-pay | Admitting: Cardiology

## 2020-04-03 NOTE — Telephone Encounter (Signed)
Advised pt if wound is healed and there are no scabs or opening remaining in incision, he may go swimming.

## 2020-04-03 NOTE — Telephone Encounter (Signed)
    Pt would like to ask Dr. Lalla Brothers if its ok for him to go swimming. He have pacemaker put it a month ago

## 2020-05-21 DIAGNOSIS — N189 Chronic kidney disease, unspecified: Secondary | ICD-10-CM | POA: Diagnosis not present

## 2020-05-21 DIAGNOSIS — E7800 Pure hypercholesterolemia, unspecified: Secondary | ICD-10-CM | POA: Diagnosis not present

## 2020-05-21 DIAGNOSIS — J449 Chronic obstructive pulmonary disease, unspecified: Secondary | ICD-10-CM | POA: Diagnosis not present

## 2020-05-21 DIAGNOSIS — I129 Hypertensive chronic kidney disease with stage 1 through stage 4 chronic kidney disease, or unspecified chronic kidney disease: Secondary | ICD-10-CM | POA: Diagnosis not present

## 2020-05-21 DIAGNOSIS — N183 Chronic kidney disease, stage 3 unspecified: Secondary | ICD-10-CM | POA: Diagnosis not present

## 2020-05-21 DIAGNOSIS — I10 Essential (primary) hypertension: Secondary | ICD-10-CM | POA: Diagnosis not present

## 2020-05-21 DIAGNOSIS — N400 Enlarged prostate without lower urinary tract symptoms: Secondary | ICD-10-CM | POA: Diagnosis not present

## 2020-06-04 ENCOUNTER — Ambulatory Visit (INDEPENDENT_AMBULATORY_CARE_PROVIDER_SITE_OTHER): Payer: Medicare Other

## 2020-06-04 DIAGNOSIS — I453 Trifascicular block: Secondary | ICD-10-CM

## 2020-06-05 LAB — CUP PACEART REMOTE DEVICE CHECK
Battery Remaining Longevity: 108 mo
Battery Voltage: 3.14 V
Brady Statistic AP VP Percent: 74.4 %
Brady Statistic AP VS Percent: 0.02 %
Brady Statistic AS VP Percent: 25.58 %
Brady Statistic AS VS Percent: 0.01 %
Brady Statistic RA Percent Paced: 74.39 %
Brady Statistic RV Percent Paced: 99.98 %
Date Time Interrogation Session: 20220104104442
Implantable Lead Implant Date: 20211001
Implantable Lead Implant Date: 20211001
Implantable Lead Location: 753859
Implantable Lead Location: 753860
Implantable Lead Model: 5076
Implantable Lead Model: 5076
Implantable Pulse Generator Implant Date: 20211001
Lead Channel Impedance Value: 323 Ohm
Lead Channel Impedance Value: 437 Ohm
Lead Channel Impedance Value: 513 Ohm
Lead Channel Impedance Value: 513 Ohm
Lead Channel Pacing Threshold Amplitude: 0.375 V
Lead Channel Pacing Threshold Amplitude: 0.875 V
Lead Channel Pacing Threshold Pulse Width: 0.4 ms
Lead Channel Pacing Threshold Pulse Width: 0.4 ms
Lead Channel Sensing Intrinsic Amplitude: 1.625 mV
Lead Channel Sensing Intrinsic Amplitude: 1.625 mV
Lead Channel Sensing Intrinsic Amplitude: 2 mV
Lead Channel Sensing Intrinsic Amplitude: 2.125 mV
Lead Channel Setting Pacing Amplitude: 2.75 V
Lead Channel Setting Pacing Amplitude: 3 V
Lead Channel Setting Pacing Pulse Width: 0.4 ms
Lead Channel Setting Sensing Sensitivity: 0.9 mV

## 2020-06-06 ENCOUNTER — Ambulatory Visit (INDEPENDENT_AMBULATORY_CARE_PROVIDER_SITE_OTHER): Payer: Medicare Other | Admitting: Cardiology

## 2020-06-06 ENCOUNTER — Other Ambulatory Visit: Payer: Self-pay

## 2020-06-06 ENCOUNTER — Encounter: Payer: Self-pay | Admitting: Cardiology

## 2020-06-06 VITALS — BP 126/78 | HR 69 | Ht 72.0 in | Wt 218.8 lb

## 2020-06-06 DIAGNOSIS — R55 Syncope and collapse: Secondary | ICD-10-CM | POA: Diagnosis not present

## 2020-06-06 DIAGNOSIS — Z950 Presence of cardiac pacemaker: Secondary | ICD-10-CM

## 2020-06-06 DIAGNOSIS — I10 Essential (primary) hypertension: Secondary | ICD-10-CM

## 2020-06-06 DIAGNOSIS — I453 Trifascicular block: Secondary | ICD-10-CM | POA: Diagnosis not present

## 2020-06-06 NOTE — Progress Notes (Signed)
Electrophysiology Office Follow up Visit Note:    Date:  06/06/2020   ID:  [name] [name], DOB 06/16/1940, MRN [id number]  PCP:  Sigmund Hazel, MD  [facility] Cardiologist:  No primary care provider on file.  [facility] Electrophysiologist:  Lanier Prude, MD    Interval History:    [name] [name] is a 80 y.o. male who presents for a follow up visit. They were last seen in clinic February 29, 2020.  He underwent a successful dual-chamber permanent pacemaker implant on March 02, 2020.  Since that time he has been doing well.  He is interested in getting back to exercise.  No further episodes of syncope or lightheadedness.     Past Medical History:  Diagnosis Date  . Colon polyp March 2010   colonoscopy with Dr Randa Evens  . Elevated fasting glucose   . History of BPH    with obstruction and hydronephrosis and slihtly elevated creatinine, using self catherization, followed by urologist Dr McDiarmid  . History of ETT 2012   [name]  . History of tobacco use    54 pack yr  . HTN (hypertension)   . Paroxysmal supraventricular tachycardia (HCC)   . Screening for AAA (abdominal aortic aneurysm) 3/16   3cm, nl/early aneurysm, repeat 3 yrs with u/s 3/19    Past Surgical History:  Procedure Laterality Date  . APPENDECTOMY    . PACEMAKER IMPLANT N/A 03/02/2020   Procedure: PACEMAKER IMPLANT;  Surgeon: Lanier Prude, MD;  Location: [facility] INVASIVE CV LAB;  Service: Cardiovascular;  Laterality: N/A;  . REPLACEMENT TOTAL KNEE BILATERAL  [id number]  . TRANSURETHRAL RESECTION OF PROSTATE  2010    Current Medications: Current Meds  Medication Sig  . atorvastatin (LIPITOR) 10 MG tablet Take 10 mg by mouth daily.  [name] losartan (COZAAR) 100 MG tablet Take 100 mg by mouth daily.  . mometasone-formoterol (DULERA) 100-5 MCG/ACT AERO TAKE 2 PUFFS FIRST THING IN THE MORNING AND THEN ANOTHER 2 PUFFS ABOUT 12 HOURS LATER  . trimethoprim (TRIMPEX) 100 MG tablet Take 100 mg by mouth  daily.     Allergies:   Nsaids   Social History   Socioeconomic History  . Marital status: Married    Spouse name: Not on file  . Number of children: Not on file  . Years of education: Not on file  . Highest education level: Not on file  Occupational History  . Occupation: retired  Tobacco Use  . Smoking status: Former Smoker    Packs/day: 3.00    Years: 30.00    Pack years: 90.00    Types: Cigarettes    Quit date: 06/02/1985    Years since quitting: 35.0  . Smokeless tobacco: Never Used  Vaping Use  . Vaping Use: Never used  Substance and Sexual Activity  . Alcohol use: Yes    Alcohol/week: 0.0 standard drinks    Comment: daily wine, occasional vodka  . Drug use: Never  . Sexual activity: Not on file  Other Topics Concern  . Not on file  Social History Narrative   Caffeine: yes   No diet   Exercise: yes   Married to [place]   2 children   Retired - Education officer, environmental, with extensive traveling   Social Determinants of Corporate investment banker Strain: Not on [employer] Insecurity: Not on file  Transportation Needs: Not on file  Physical Activity: Not on file  Stress: Not on file  Social Connections: Not on  file     Family History: The patient's family history includes Healthy in his brother and brother; Kidney disease in his father. There is no history of Colon cancer or Colon polyps.  ROS:   Please see the history of present illness.    All other systems reviewed and are negative.  EKGs/Labs/Other Studies Reviewed:    The following studies were reviewed today: Pacemaker implant note  EKG:  The ekg ordered today demonstrates AV pacing at 64 bpm  June 06, 2020 device interrogation personally reviewed Presenting rhythm AV pacing Longevity 11.5 years Atrial lead impedance 494 ohms, capture threshold 0.875 at 0.4, sensing 1.9 mV Ventricular lead impedance 494 ohms, capture threshold 0.5 at 0.4, sensing 2 mV DDDR 60-1 30 Atrially pacing 74.6%,  ventricular pacing 100% 0% A. fib burden Patient activity averaging 4.2 hours/day  Recent Labs: 02/03/2020: BUN 19; Creatinine, Ser 1.22; Hemoglobin 14.3; Platelets 194; Potassium 4.2; Sodium 138 02/10/2020: TSH 3.030  Recent Lipid Panel No results found for: CHOL, TRIG, HDL, CHOLHDL, VLDL, LDLCALC, LDLDIRECT  Physical Exam:    VS:  BP 126/78   Pulse 69   Ht 6' (1.829 m)   Wt 218 lb 12.8 oz (99.2 kg)   SpO2 95%   BMI 29.67 kg/m     Wt Readings from Last 3 Encounters:  06/06/20 218 lb 12.8 oz (99.2 kg)  04/02/20 215 lb 12.8 oz (97.9 kg)  03/02/20 215 lb (97.5 kg)     GEN:  Well nourished, well developed in no acute distress HEENT: Normal NECK: No JVD; No carotid bruits LYMPHATICS: No lymphadenopathy CARDIAC: RRR, no murmurs, rubs, gallops.  Pacemaker pocket well-healed RESPIRATORY:  Clear to auscultation without rales, wheezing or rhonchi  ABDOMEN: Soft, non-tender, non-distended MUSCULOSKELETAL:  No edema; No deformity  SKIN: Warm and dry NEUROLOGIC:  Alert and oriented x 3 PSYCHIATRIC:  Normal affect   ASSESSMENT:    1. Syncope, unspecified syncope type   2. Trifascicular block   3. Pacemaker   4. Essential hypertension    PLAN:    In order of problems listed above:  1. Syncope, trifascicular bloc Now post permanent pacemaker implant.  Device well functioning on today's device interrogation.  No further episodes of syncope or presyncope. Plan to see the patient back in 9 months which will be 1 year post implant. Continue remote monitoring.   Medication Adjustments/Labs and Tests Ordered: Current medicines are reviewed at length with the patient today.  Concerns regarding medicines are outlined above.  Orders Placed This Encounter  Procedures  . EKG 12-Lead   No orders of the defined types were placed in this encounter.    Signed, Steffanie Dunn, MD, [facility]  06/06/2020 8:34 PM    Electrophysiology  [facility]

## 2020-06-06 NOTE — Patient Instructions (Addendum)
Medication Instructions:  Your physician recommends that you continue on your current medications as directed. Please refer to the Current Medication list given to you today.  Labwork: None ordered.  Testing/Procedures: None ordered.  Follow-Up: Your physician wants you to follow-up in: 9 months with Dr. Lalla Brothers.   You will receive a reminder letter in the mail two months in advance. If you don't receive a letter, please call our office to schedule the follow-up appointment.  Remote monitoring is used to monitor your Pacemaker from home. This monitoring reduces the number of office visits required to check your device to one time per year. It allows [place] to keep an eye on the functioning of your device to ensure it is working properly. You are scheduled for a device check from home on 09/03/2020. You may send your transmission at any time that day. If you have a wireless device, the transmission will be sent automatically. After your physician reviews your transmission, you will receive a postcard with your next transmission date.  Any Other Special Instructions Will Be Listed Below (If Applicable).  If you need a refill on your cardiac medications before your next appointment, please call your pharmacy.

## 2020-06-19 NOTE — Progress Notes (Signed)
Remote pacemaker transmission.   

## 2020-07-04 DIAGNOSIS — J449 Chronic obstructive pulmonary disease, unspecified: Secondary | ICD-10-CM | POA: Diagnosis not present

## 2020-07-04 DIAGNOSIS — Z950 Presence of cardiac pacemaker: Secondary | ICD-10-CM | POA: Diagnosis not present

## 2020-07-04 DIAGNOSIS — E7800 Pure hypercholesterolemia, unspecified: Secondary | ICD-10-CM | POA: Diagnosis not present

## 2020-07-04 DIAGNOSIS — I129 Hypertensive chronic kidney disease with stage 1 through stage 4 chronic kidney disease, or unspecified chronic kidney disease: Secondary | ICD-10-CM | POA: Diagnosis not present

## 2020-07-04 DIAGNOSIS — Z789 Other specified health status: Secondary | ICD-10-CM | POA: Diagnosis not present

## 2020-07-04 DIAGNOSIS — R7303 Prediabetes: Secondary | ICD-10-CM | POA: Diagnosis not present

## 2020-07-07 ENCOUNTER — Telehealth: Payer: Self-pay | Admitting: Infectious Diseases

## 2020-07-07 NOTE — Telephone Encounter (Signed)
Called to discuss with patient about COVID-19 symptoms and the use of one of the available treatments for those with mild to moderate Covid symptoms and at a high risk of hospitalization.  Pt appears to qualify for outpatient treatment due to co-morbid conditions and/or a member of an at-risk group in accordance with the FDA Emergency Use Authorization.    Symptom onset: 2/1 Vaccinated: yes Booster? Not documented  Immunocompromised? no Qualifiers: multiple including age and cardiac conditions.   Unable to reach pt - LVM and sent mychart to him. Would like to offer Paxlovid today if he can be reached before pharmacy closes or consider for IV treatment Monday.    Rexene Alberts

## 2020-08-02 ENCOUNTER — Emergency Department (HOSPITAL_COMMUNITY): Payer: Medicare Other

## 2020-08-02 ENCOUNTER — Observation Stay (HOSPITAL_COMMUNITY)
Admission: EM | Admit: 2020-08-02 | Discharge: 2020-08-04 | Disposition: A | Discharge status: Home / Self Care | Payer: Medicare Other | Attending: Family Medicine | Admitting: Family Medicine

## 2020-08-02 ENCOUNTER — Encounter (HOSPITAL_COMMUNITY): Payer: Self-pay | Admitting: Emergency Medicine

## 2020-08-02 ENCOUNTER — Ambulatory Visit: Payer: Medicare Other

## 2020-08-02 ENCOUNTER — Other Ambulatory Visit: Payer: Self-pay

## 2020-08-02 ENCOUNTER — Ambulatory Visit (INDEPENDENT_AMBULATORY_CARE_PROVIDER_SITE_OTHER): Payer: Medicare Other | Admitting: Adult Health

## 2020-08-02 ENCOUNTER — Encounter: Payer: Self-pay | Admitting: Adult Health

## 2020-08-02 VITALS — BP 140/80 | HR 89 | Temp 98.0°F | Ht 72.0 in | Wt 214.0 lb

## 2020-08-02 DIAGNOSIS — Z96643 Presence of artificial hip joint, bilateral: Secondary | ICD-10-CM | POA: Diagnosis not present

## 2020-08-02 DIAGNOSIS — I10 Essential (primary) hypertension: Secondary | ICD-10-CM | POA: Diagnosis not present

## 2020-08-02 DIAGNOSIS — Z950 Presence of cardiac pacemaker: Secondary | ICD-10-CM | POA: Insufficient documentation

## 2020-08-02 DIAGNOSIS — Z87891 Personal history of nicotine dependence: Secondary | ICD-10-CM | POA: Diagnosis not present

## 2020-08-02 DIAGNOSIS — I25110 Atherosclerotic heart disease of native coronary artery with unstable angina pectoris: Principal | ICD-10-CM | POA: Insufficient documentation

## 2020-08-02 DIAGNOSIS — Z79899 Other long term (current) drug therapy: Secondary | ICD-10-CM | POA: Diagnosis not present

## 2020-08-02 DIAGNOSIS — R0902 Hypoxemia: Secondary | ICD-10-CM | POA: Diagnosis not present

## 2020-08-02 DIAGNOSIS — R079 Chest pain, unspecified: Secondary | ICD-10-CM | POA: Diagnosis not present

## 2020-08-02 DIAGNOSIS — J449 Chronic obstructive pulmonary disease, unspecified: Secondary | ICD-10-CM | POA: Diagnosis not present

## 2020-08-02 DIAGNOSIS — R0602 Shortness of breath: Secondary | ICD-10-CM | POA: Diagnosis not present

## 2020-08-02 DIAGNOSIS — I200 Unstable angina: Secondary | ICD-10-CM

## 2020-08-02 DIAGNOSIS — J9811 Atelectasis: Secondary | ICD-10-CM | POA: Diagnosis not present

## 2020-08-02 DIAGNOSIS — R000 Tachycardia, unspecified: Secondary | ICD-10-CM | POA: Diagnosis not present

## 2020-08-02 DIAGNOSIS — U071 COVID-19: Secondary | ICD-10-CM | POA: Insufficient documentation

## 2020-08-02 DIAGNOSIS — R0789 Other chest pain: Secondary | ICD-10-CM | POA: Diagnosis not present

## 2020-08-02 LAB — CBC
HCT: 47.2 % (ref 39.0–52.0)
Hemoglobin: 14.8 g/dL (ref 13.0–17.0)
MCH: 28.1 pg (ref 26.0–34.0)
MCHC: 31.4 g/dL (ref 30.0–36.0)
MCV: 89.7 fL (ref 80.0–100.0)
Platelets: 200 10*3/uL (ref 150–400)
RBC: 5.26 MIL/uL (ref 4.22–5.81)
RDW: 13.1 % (ref 11.5–15.5)
WBC: 10.3 10*3/uL (ref 4.0–10.5)
nRBC: 0 % (ref 0.0–0.2)

## 2020-08-02 LAB — BASIC METABOLIC PANEL
Anion gap: 10 (ref 5–15)
BUN: 17 mg/dL (ref 8–23)
CO2: 23 mmol/L (ref 22–32)
Calcium: 8.9 mg/dL (ref 8.9–10.3)
Chloride: 102 mmol/L (ref 98–111)
Creatinine, Ser: 1.39 mg/dL — ABNORMAL HIGH (ref 0.61–1.24)
GFR, Estimated: 51 mL/min — ABNORMAL LOW (ref >60)
Glucose, Bld: 88 mg/dL (ref 70–99)
Potassium: 4.5 mmol/L (ref 3.5–5.1)
Sodium: 135 mmol/L (ref 135–145)

## 2020-08-02 LAB — TROPONIN I (HIGH SENSITIVITY)
Troponin I (High Sensitivity): 4 ng/L (ref <18)
Troponin I (High Sensitivity): 5 ng/L (ref <18)

## 2020-08-02 MED ORDER — PREDNISONE 10 MG PO TABS: ORAL_TABLET | ORAL | 0 refills | Status: DC

## 2020-08-02 NOTE — Assessment & Plan Note (Signed)
?   Flare with cough post covid 19 . Hold on tx for now .  Will need chest xray going forward.

## 2020-08-02 NOTE — ED Triage Notes (Signed)
Pt here from MD office with c/o chest pain for 3 weeks , paced rhythm, pt had covid about 1 month ago

## 2020-08-02 NOTE — Patient Instructions (Addendum)
Transport to ER via EMS 

## 2020-08-02 NOTE — ED Provider Notes (Signed)
[facility] EMERGENCY DEPARTMENT Provider Note   CSN: [id number] Arrival date & time: 08/02/20  1315     History No chief complaint on file.   STRATTON VILLWOCK is a 80 y.o. male.  Patient is a 80 year old male who presents with chest pain.  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  He does not have a known history of coronary artery disease.  He was diagnosed with Covid about a month ago.  He has been having some worsening chest pain over the last couple of weeks.  He describes it as a burning sensation just to the left of his sternum.  He says it comes on with walking, particularly walking in the cold weather but really with any walking.  He says in fact it started again when he walked back to his stretcher here in the ED.  However when he lays down to rest it goes away after several minutes.  He says he only has a very dull discomfort right now.  He does not have any pleuritic pain.  No leg pain or swelling.  He has a history of COPD and has some baseline shortness of breath but does not feel like it is any different than his baseline.  No fevers.  He says his Covid was fairly mild.  He has a little bit of a lingering cough but did not really have any other significant symptoms.        Past Medical History:  Diagnosis Date  . Colon polyp March 2010   colonoscopy with Dr Randa Evens  . Elevated fasting glucose   . History of BPH    with obstruction and hydronephrosis and slihtly elevated creatinine, using self catherization, followed by urologist Dr McDiarmid  . History of ETT 2012   [name]  . History of tobacco use    54 pack yr  . HTN (hypertension)   . Paroxysmal supraventricular tachycardia (HCC)   . Screening for AAA (abdominal aortic aneurysm) 3/16   3cm, nl/early aneurysm, repeat 3 yrs with u/s 3/19    Patient Active Problem List   Diagnosis Date Noted  . Chest pain 08/02/2020  . Syncope  06/06/2020  . 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 Pacemaker 06/06/2020  . Bradycardia 02/29/2020  . Upper airway cough syndrome 08/11/2017  . Dyspnea 07/18/2015  . Right bundle branch block 07/18/2015  . History of bradycardia 07/18/2015  . COPD, group B, by GOLD 2017 classification (HCC) 07/18/2015  . Essential hypertension 07/18/2015    Past Surgical History:  Procedure Laterality Date  . APPENDECTOMY    . PACEMAKER IMPLANT N/A 03/02/2020   Procedure: PACEMAKER IMPLANT;  Surgeon: Lanier Prude, MD;  Location: [facility] INVASIVE CV LAB;  Service: Cardiovascular;  Laterality: N/A;  . REPLACEMENT TOTAL KNEE BILATERAL  [id number]  . TRANSURETHRAL RESECTION OF PROSTATE  2010       Family History  Problem Relation Age of Onset  . Kidney disease Father        ESRD  . Healthy Brother   . Healthy Brother   . Colon cancer Neg Hx   . Colon polyps Neg Hx     Social History   Tobacco Use  . Smoking status: Former Smoker    Packs/day: 3.00    Years: 30.00    Pack years: 90.00    Types: Cigarettes    Quit date: 06/02/1985    Years since quitting:  35.1  . Smokeless tobacco: Never Used  Vaping Use  . Vaping Use: Never used  Substance Use Topics  . Alcohol use: Yes    Alcohol/week: 0.0 standard drinks    Comment: daily wine, occasional vodka  . Drug use: Never    Home Medications Prior to Admission medications   Medication Sig Start Date End Date Taking? Authorizing Provider  acetaminophen (TYLENOL) 325 MG tablet Take 650 mg by mouth every 6 (six) hours as needed for moderate pain or headache.   Yes [provider]  atorvastatin (LIPITOR) 10 MG tablet Take 10 mg by mouth daily. 07/25/19  Yes [provider]  calcium carbonate (TUMS - DOSED IN MG ELEMENTAL CALCIUM) 500 MG chewable tablet Chew 1 tablet by mouth 2 (two) times daily as needed for indigestion or heartburn.   Yes [provider]  losartan (COZAAR) 100 MG tablet Take 100 mg by mouth daily.  04/19/15  Yes [provider]  mometasone-formoterol (DULERA) 100-5 MCG/ACT AERO TAKE 2 PUFFS FIRST THING IN THE MORNING AND THEN ANOTHER 2 PUFFS ABOUT 12 HOURS LATER Patient taking differently: Inhale 2 puffs into the lungs in the morning and at bedtime. 01/24/20  Yes Mannam, Praveen, MD  predniSONE (DELTASONE) 10 MG tablet 4 tabs for 2 days, then 3 tabs for 2 days, 2 tabs for 2 days, then 1 tab for 2 days, then stop 08/02/20  Yes Parrett, Tammy S, NP  trimethoprim (TRIMPEX) 100 MG tablet Take 100 mg by mouth daily. 06/04/15  Yes [provider]    Allergies    Nsaids  Review of Systems   Review of Systems  Constitutional: Negative for chills, diaphoresis, fatigue and fever.  HENT: Negative for congestion, rhinorrhea and sneezing.   Eyes: Negative.   Respiratory: Positive for shortness of breath. Negative for cough and chest tightness.   Cardiovascular: Positive for chest pain. Negative for leg swelling.  Gastrointestinal: Negative for abdominal pain, blood in stool, diarrhea, nausea and vomiting.  Genitourinary: Negative for difficulty urinating, flank pain, frequency and hematuria.  Musculoskeletal: Negative for arthralgias and back pain.  Skin: Negative for rash.  Neurological: Negative for dizziness, speech difficulty, weakness, numbness and headaches.    Physical Exam Updated Vital Signs BP (!) 175/93   Pulse 63   Temp (!) 97.5 F (36.4 C) (Oral)   Resp (!) 24   Ht 6' (1.829 m)   Wt 97.1 kg   SpO2 100%   BMI 29.03 kg/m   Physical Exam Constitutional:      Appearance: He is well-developed and well-nourished.  HENT:     Head: Normocephalic and atraumatic.  Eyes:     Pupils: Pupils are equal, round, and reactive to light.  Cardiovascular:     Rate and Rhythm: Normal rate and regular rhythm.     Heart sounds: Normal heart sounds.  Pulmonary:     Effort: Pulmonary effort is normal. No respiratory distress.     Breath sounds: Normal breath sounds. No  wheezing or rales.  Chest:     Chest wall: No tenderness.  Abdominal:     General: Bowel sounds are normal.     Palpations: Abdomen is soft.     Tenderness: There is no abdominal tenderness. There is no guarding or rebound.  Musculoskeletal:        General: No edema. Normal range of motion.     Cervical back: Normal range of motion and neck supple.     Comments: No edema or calf tenderness  Lymphadenopathy:     Cervical: No cervical adenopathy.  Skin:    General: Skin is warm and dry.     Findings: No rash.  Neurological:     Mental Status: He is alert and oriented to person, place, and time.  Psychiatric:        Mood and Affect: Mood and affect normal.     ED Results / Procedures / Treatments   Labs (all labs ordered are listed, but only abnormal results are displayed) Labs Reviewed  BASIC METABOLIC PANEL - Abnormal; Notable for the following components:      Result Value   Creatinine, Ser 1.39 (*)    GFR, Estimated 51 (*)    All other components within normal limits  CBC  TROPONIN I (HIGH SENSITIVITY)  TROPONIN I (HIGH SENSITIVITY)    EKG EKG Interpretation  Date/Time:  Thursday August 02 2020 13:17:54 EST Ventricular Rate:  65 PR Interval:    QRS Duration: 180 QT Interval:  460 QTC Calculation: 478 R Axis:   -101 Text Interpretation: AV dual-paced rhythm Abnormal ECG Confirmed by Rolan Bucco [phone]) on 08/02/2020 10:21:56 PM   Radiology DG Chest 2 View  Result Date: 08/02/2020 CLINICAL DATA:  Chest pain for 2-3 weeks EXAM: CHEST - 2 VIEW COMPARISON:  03/02/2020 FINDINGS: Cardiac shadow is within normal limits. Pacing device is again seen and stable. The lungs are well aerated bilaterally. Mild atelectatic changes are noted posteriorly projecting in the right lower lobe. No effusion or pneumothorax is seen. No bony abnormality is noted. IMPRESSION: Mild right basilar atelectasis. Electronically Signed   By: Alcide Clever M.D.   On: 08/02/2020 13:53     Procedures Procedures   Medications Ordered in ED Medications - No data to display  ED Course  I have reviewed the triage vital signs and the nursing notes.  Pertinent labs & imaging results that were available during my care of the patient were reviewed by me and considered in my medical decision making (see chart for details).    MDM Rules/Calculators/A&P                          Patient presents with exertional chest pain.  He does not have any reproducible pain.  He does not have other symptoms that sound more concerning for PE.  He has an EKG that shows a paced rhythm.  His chest x-ray is clear without evidence of pneumonia or pneumothorax.  No pulmonary edema.  His labs are nonconcerning.  He has had 2 - troponins.  Given his exertional symptoms, I feel we should admit him for further treatment.  I spoke with the cardiology fellow on-call who recommended admission to the hospitalist service.  He advised that they can consult cardiology as needed.  I spoke with Dr. Toniann Fail who admit the patient for further treatment. Final Clinical Impression(s) / ED Diagnoses Final diagnoses:  Chest pain, unspecified type    Rx / DC Orders ED Discharge Orders    None       Rolan Bucco, MD 08/02/20 2336

## 2020-08-02 NOTE — Assessment & Plan Note (Signed)
Chest pain -exertional - for 3 weeks. Post Covid 19 ~ 4 weeks  EKG shows diffuse ST elevation ? Pericarditis .  Recent hx of High grade AV block with syncope . S/p Pacemaker 03/2020. Echo 10 /2021 w/ preserved EF .  Will need further evaluation , ER transport via EMS .  Placed on O2 at 2l/ in stable condition .   Plan  Transport to ER via EMS

## 2020-08-02 NOTE — Progress Notes (Signed)
@Patient  ID: , male    DOB: March 18, 1941, 80 y.o.   MRN: [id number]  Chief Complaint  Patient presents with  . Follow-up    Referring provider: [id number], MD  HPI: 80 year old male with heavy smoking history quit in 1987 followed for Mild COPD -GOLD B   TEST/EVENTS :  PFTs  05/23/15 FVC 3.59 (78%), FEV1 1.08 (30%), F/F 38 Severe obstructive lung disease.  08/11/2017 FVC 4.72 [105%], FEV1 2.92 [90%], F/F 62, TLC 142%, DLCO 70% Mild obstructive airway disease with air trapping, hyperinflation, reversibility.  12/01/2019 FVC 4.74 [107%], FEV1 3.03 [94%], F/F 64, TLC 9.46 [126%), DLCO 23.57 [90%] Mild obstructive airway disease with air trapping and hyperinflation.  Mildly improved compared to 2019  Labs A1AT levels 12/20/15- 109, PIMM phenotype CBC 08/11/2017-WBC 10.7, eos 1.7%, absolute eosinophil count 182 Blood allergy profile 08/11/2017-IgE 154, RAST panel negative  Imaging Chest x-ray 07/03/16-  hyperinflated lung, mild left base atelectasis/scarring Chest x-ray 07/13/2017- hyperinflated lungs CT abdomen pelvis 07/24/2017- mild scarring in the bases.  No evidence of interstitial lung disease. Chest x-ray 10/10/2019-no acute cardiopulmonary disease   08/02/2020 Follow up : COPD  Patient presents for a 34-month follow-up.  Patient has underlying mild COPD.  He is maintained on Dulera twice daily. Says was doing well until 4 weeks ago when he got Covid 19 infection.  Reports that he is fully vaccinated for COVID-19.  Had a mild infection in January but has had a lingering cough since then.  Also over the last 3 weeks has had exertional chest burning.  Worse with activities.  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  And underwent pacemaker implantation.  Patient says he has had no further syncopal episodes.  2D echo at that time showed a preserved EF.  Patient denies any radiating pains but describes a burning sensation along the  mid chest when he walks.  Patient denies any jaw pain.  Denies any calf pain, hemoptysis, fever or discolored mucus.  Does have a lingering dry cough. Patient is fully vaccinated for COVID-19.  Prevnar and Pneumovax are up-to-date. EKG today shows Diffuse ST elevation . R BBB HR 63.  O2 saturations on room air are 95%.   Allergies  Allergen Reactions  . Nsaids     REACTION: abnormal kidney fuction    Immunization History  Administered Date(s) Administered  . Fluad Quad(high Dose 65+) 02/01/2020  . Influenza Split 05/23/2015  . Influenza, High Dose Seasonal PF 05/23/2015, 04/13/2017  . Influenza-Unspecified 03/16/2013  . PFIZER(Purple Top)SARS-COV-2 Vaccination 06/14/2019, 07/04/2019, 02/01/2020  . Pneumococcal Conjugate-13 08/19/2013  . Pneumococcal Polysaccharide-23 04/21/2007, 06/06/2007, 06/02/2013  . Zoster 04/04/2008    Past Medical History:  Diagnosis Date  . Colon polyp March 2010   colonoscopy with Dr April 2010  . Elevated fasting glucose   . History of BPH    with obstruction and hydronephrosis and slihtly elevated creatinine, using self catherization, followed by urologist Dr McDiarmid  . History of ETT 2012   [name]  . History of tobacco use    54 pack yr  . HTN (hypertension)   . Paroxysmal supraventricular tachycardia (HCC)   . Screening for AAA (abdominal aortic aneurysm) 3/16   3cm, nl/early aneurysm, repeat 3 yrs with u/s 3/19    Tobacco History: Social History   Tobacco Use  Smoking Status Former Smoker  . Packs/day: 3.00  . Years: 30.00  . Pack years: 90.00  . Types: Cigarettes  .  Quit date: 06/02/1985  . Years since quitting: 35.1  Smokeless Tobacco Never Used   Counseling given: Not Answered  SH Married , Retired . Active .  Has dogs , walks daily . Does yard work .  Adult kids, grands.  Previous [place] depart. [employer].   Outpatient Medications Prior to Visit  Medication Sig Dispense Refill  . atorvastatin (LIPITOR) 10 MG tablet  Take 10 mg by mouth daily.    [name] losartan (COZAAR) 100 MG tablet Take 100 mg by mouth daily.    . mometasone-formoterol (DULERA) 100-5 MCG/ACT AERO TAKE 2 PUFFS FIRST THING IN THE MORNING AND THEN ANOTHER 2 PUFFS ABOUT 12 HOURS LATER 13 g 6  . trimethoprim (TRIMPEX) 100 MG tablet Take 100 mg by mouth daily.     No facility-administered medications prior to visit.     Review of Systems:   Constitutional:   No  weight loss, night sweats,  Fevers, chills, fatigue, or  lassitude.  HEENT:   No headaches,  Difficulty swallowing,  Tooth/dental problems, or  Sore throat,                No sneezing, itching, ear ache, nasal congestion, post nasal drip,   CV:  No chest pain,  Orthopnea, PND, swelling in lower extremities, anasarca, dizziness, palpitations, syncope.   GI  No heartburn, indigestion, abdominal pain, nausea, vomiting, diarrhea, change in bowel habits, loss of appetite, bloody stools.   Resp:+ cough   No chest wall deformity  Skin: no rash or lesions.  GU: no dysuria, change in color of urine, no urgency or frequency.  No flank pain, no hematuria   MS:  No joint pain or swelling.  No decreased range of motion.  No back pain.    Physical Exam  BP 140/80 (BP Location: Left Arm, Patient Position: Sitting, Cuff Size: Normal)   Pulse 89   Temp 98 F (36.7 C) (Temporal)   Ht 6' (1.829 m)   Wt 214 lb (97.1 kg)   SpO2 95%   BMI 29.02 kg/m   GEN: A/Ox3; pleasant , NAD, well nourished    HEENT:  [place]/AT,  EACs-clear, TMs-wnl, NOSE-clear, THROAT-clear, no lesions, no postnasal drip or exudate noted.   NECK:  Supple w/ fair ROM; no JVD; normal carotid impulses w/o bruits; no thyromegaly or nodules palpated; no lymphadenopathy.    RESP  Clear  P & A; w/o, wheezes/ rales/ or rhonchi. no accessory muscle use, no dullness to percussion, no chest wall tenderness   CARD:  RRR, no m/r/g, no peripheral edema, pulses intact, no cyanosis or clubbing.  GI:   Soft & nt; nml bowel sounds;  no organomegaly or masses detected.   Musco: Warm bil, no deformities or joint swelling noted.   Neuro: alert, no focal deficits noted.    Skin: Warm, no lesions or rashes    Lab Results:  CBC    Component Value Date/Time   WBC 9.5 02/03/2020 0547   RBC 4.76 02/03/2020 0547   HGB 14.3 02/03/2020 0547   HCT 42.6 02/03/2020 0547   PLT 194 02/03/2020 0547   MCV 89.5 02/03/2020 0547   MCH 30.0 02/03/2020 0547   MCHC 33.6 02/03/2020 0547   RDW 13.2 02/03/2020 0547   LYMPHSABS 5.5 (H) 08/11/2017 1214   MONOABS 0.7 08/11/2017 1214   EOSABS 0.2 08/11/2017 1214   BASOSABS 0.1 08/11/2017 1214    BMET    Component Value Date/Time   NA 138 02/03/2020 0547  K 4.2 02/03/2020 0547   CL 107 02/03/2020 0547   CO2 23 02/03/2020 0547   GLUCOSE 117 (H) 02/03/2020 0547   BUN 19 02/03/2020 0547   CREATININE 1.22 02/03/2020 0547   CALCIUM 8.4 (L) 02/03/2020 0547   GFRNONAA 56 (L) 02/03/2020 0547   GFRAA >60 02/03/2020 0547   EKG today shows Diffuse ST elevation . R BBB HR 63.   BNP No results found for: BNP  ProBNP No results found for: PROBNP  Imaging: No results found.    PFT Results Latest Ref Rng & Units 12/01/2019 08/11/2017  FVC-Pre L 4.81 4.52  FVC-Predicted Pre % 108 100  FVC-Post L 4.74 4.72  FVC-Predicted Post % 107 105  Pre FEV1/FVC % % 63 57  Post FEV1/FCV % % 64 62  FEV1-Pre L 3.01 2.57  FEV1-Predicted Pre % 95 79  FEV1-Post L 3.03 2.93  DLCO uncorrected ml/min/mmHg 23.57 24.52  DLCO UNC% % 90 70  DLCO corrected ml/min/mmHg 42.97 24.81  DLCO COR %Predicted % 164 70  DLVA Predicted % 135 62  TLC L 9.46 10.61  TLC % Predicted % 126 142  RV % Predicted % 157 205    No results found for: NITRICOXIDE      Assessment & Plan:   Chest pain Chest pain -exertional - for 3 weeks. Post Covid 19 ~ 4 weeks  EKG shows diffuse ST elevation ? Pericarditis .  Recent hx of High grade AV block with syncope . S/p Pacemaker 03/2020. Echo 10 /2021 w/ preserved EF  .  Will need further evaluation , ER transport via EMS .  Placed on O2 at 2l/ in stable condition .   Plan  Transport to ER via EMS      COPD, group B, by GOLD 2017 classification (HCC) ? Flare with cough post covid 19 . Hold on tx for now .  Will need chest xray going forward.       Rubye Oaks, NP 08/02/2020

## 2020-08-03 ENCOUNTER — Encounter (HOSPITAL_COMMUNITY)
Admission: EM | Disposition: A | Discharge status: Home / Self Care | Payer: Self-pay | Source: Home / Self Care | Attending: Emergency Medicine

## 2020-08-03 ENCOUNTER — Observation Stay (HOSPITAL_BASED_OUTPATIENT_CLINIC_OR_DEPARTMENT_OTHER): Payer: Medicare Other

## 2020-08-03 ENCOUNTER — Encounter (HOSPITAL_COMMUNITY): Payer: Self-pay | Admitting: Internal Medicine

## 2020-08-03 DIAGNOSIS — I200 Unstable angina: Secondary | ICD-10-CM

## 2020-08-03 DIAGNOSIS — R079 Chest pain, unspecified: Secondary | ICD-10-CM

## 2020-08-03 DIAGNOSIS — I25110 Atherosclerotic heart disease of native coronary artery with unstable angina pectoris: Secondary | ICD-10-CM | POA: Diagnosis not present

## 2020-08-03 DIAGNOSIS — I10 Essential (primary) hypertension: Secondary | ICD-10-CM | POA: Diagnosis not present

## 2020-08-03 HISTORY — PX: LEFT HEART CATH AND CORONARY ANGIOGRAPHY: CATH118249

## 2020-08-03 LAB — BASIC METABOLIC PANEL
Anion gap: 9 (ref 5–15)
BUN: 17 mg/dL (ref 8–23)
CO2: 23 mmol/L (ref 22–32)
Calcium: 8.7 mg/dL — ABNORMAL LOW (ref 8.9–10.3)
Chloride: 103 mmol/L (ref 98–111)
Creatinine, Ser: 1.39 mg/dL — ABNORMAL HIGH (ref 0.61–1.24)
GFR, Estimated: 51 mL/min — ABNORMAL LOW (ref >60)
Glucose, Bld: 99 mg/dL (ref 70–99)
Potassium: 4.5 mmol/L (ref 3.5–5.1)
Sodium: 135 mmol/L (ref 135–145)

## 2020-08-03 LAB — ECHOCARDIOGRAM COMPLETE
Area-P 1/2: 2.32 cm2
Height: 72 in
S' Lateral: 3.4 cm
Weight: 3286.4 oz

## 2020-08-03 LAB — SEDIMENTATION RATE: Sed Rate: 2 mm/hr (ref 0–16)

## 2020-08-03 LAB — C-REACTIVE PROTEIN: CRP: <0.5 mg/dL (ref <1.0)

## 2020-08-03 LAB — CBC
HCT: 42.4 % (ref 39.0–52.0)
Hemoglobin: 14 g/dL (ref 13.0–17.0)
MCH: 28.6 pg (ref 26.0–34.0)
MCHC: 33 g/dL (ref 30.0–36.0)
MCV: 86.7 fL (ref 80.0–100.0)
Platelets: 174 10*3/uL (ref 150–400)
RBC: 4.89 MIL/uL (ref 4.22–5.81)
RDW: 13.2 % (ref 11.5–15.5)
WBC: 8.7 10*3/uL (ref 4.0–10.5)
nRBC: 0 % (ref 0.0–0.2)

## 2020-08-03 LAB — LIPID PANEL
Cholesterol: 106 mg/dL (ref 0–200)
HDL: 58 mg/dL (ref >40)
LDL Cholesterol: 38 mg/dL (ref 0–99)
Total CHOL/HDL Ratio: 1.8 RATIO
Triglycerides: 50 mg/dL (ref <150)
VLDL: 10 mg/dL (ref 0–40)

## 2020-08-03 LAB — HEMOGLOBIN A1C
Hgb A1c MFr Bld: 6.1 % — ABNORMAL HIGH (ref 4.8–5.6)
Mean Plasma Glucose: 128.37 mg/dL

## 2020-08-03 LAB — SARS CORONAVIRUS 2 (TAT 6-24 HRS): SARS Coronavirus 2: POSITIVE — AB

## 2020-08-03 LAB — D-DIMER, QUANTITATIVE: D-Dimer, Quant: 0.39 ug/mL-FEU (ref 0.00–0.50)

## 2020-08-03 SURGERY — LEFT HEART CATH AND CORONARY ANGIOGRAPHY
Anesthesia: LOCAL

## 2020-08-03 MED ORDER — SODIUM CHLORIDE 0.9 % IV SOLN: 250.0000 mL | INTRAVENOUS | Status: DC | PRN

## 2020-08-03 MED ORDER — HEPARIN SODIUM (PORCINE) 1000 UNIT/ML IJ SOLN
INTRAMUSCULAR | Status: AC
  Filled 2020-08-03: qty 1

## 2020-08-03 MED ORDER — ASPIRIN 81 MG PO CHEW: 81.0000 mg | CHEWABLE_TABLET | ORAL | Status: DC

## 2020-08-03 MED ORDER — TRIMETHOPRIM 100 MG PO TABS
100.0000 mg | ORAL_TABLET | Freq: Every day | ORAL | Status: DC
Start: 2020-08-03 — End: 2020-08-04
  Administered 2020-08-03 – 2020-08-04 (×2): 100 mg via ORAL
  Filled 2020-08-03 (×2): qty 1

## 2020-08-03 MED ORDER — MOMETASONE FURO-FORMOTEROL FUM 100-5 MCG/ACT IN AERO
2.0000 | INHALATION_SPRAY | Freq: Two times a day (BID) | RESPIRATORY_TRACT | Status: DC
  Administered 2020-08-03 – 2020-08-04 (×2): 2 via RESPIRATORY_TRACT
  Filled 2020-08-03: qty 8.8

## 2020-08-03 MED ORDER — HEPARIN (PORCINE) IN NACL 1000-0.9 UT/500ML-% IV SOLN
INTRAVENOUS | Status: AC
  Filled 2020-08-03: qty 1000

## 2020-08-03 MED ORDER — IOHEXOL 350 MG/ML SOLN
INTRAVENOUS | Status: DC | PRN
  Administered 2020-08-03: 55 mL via INTRA_ARTERIAL

## 2020-08-03 MED ORDER — LOSARTAN POTASSIUM 50 MG PO TABS
100.0000 mg | ORAL_TABLET | Freq: Every day | ORAL | Status: DC
  Administered 2020-08-03 – 2020-08-04 (×2): 100 mg via ORAL
  Filled 2020-08-03 (×2): qty 2

## 2020-08-03 MED ORDER — ASPIRIN EC 81 MG PO TBEC
81.0000 mg | DELAYED_RELEASE_TABLET | Freq: Every day | ORAL | Status: DC
  Administered 2020-08-03 – 2020-08-04 (×2): 81 mg via ORAL
  Filled 2020-08-03 (×2): qty 1

## 2020-08-03 MED ORDER — SODIUM CHLORIDE 0.9 % WEIGHT BASED INFUSION: 1.0000 mL/kg/h | INTRAVENOUS | Status: DC

## 2020-08-03 MED ORDER — VERAPAMIL HCL 2.5 MG/ML IV SOLN
INTRAVENOUS | Status: AC
  Filled 2020-08-03: qty 2

## 2020-08-03 MED ORDER — ATORVASTATIN CALCIUM 10 MG PO TABS
10.0000 mg | ORAL_TABLET | Freq: Every day | ORAL | Status: DC
  Administered 2020-08-03 – 2020-08-04 (×2): 10 mg via ORAL
  Filled 2020-08-03 (×2): qty 1

## 2020-08-03 MED ORDER — LABETALOL HCL 5 MG/ML IV SOLN: 10.0000 mg | INTRAVENOUS | Status: AC | PRN

## 2020-08-03 MED ORDER — HYDRALAZINE HCL 20 MG/ML IJ SOLN: 10.0000 mg | INTRAMUSCULAR | Status: AC | PRN

## 2020-08-03 MED ORDER — MIDAZOLAM HCL 2 MG/2ML IJ SOLN
INTRAMUSCULAR | Status: AC
  Filled 2020-08-03: qty 2

## 2020-08-03 MED ORDER — MIDAZOLAM HCL 2 MG/2ML IJ SOLN
INTRAMUSCULAR | Status: DC | PRN
  Administered 2020-08-03: 1 mg via INTRAVENOUS

## 2020-08-03 MED ORDER — SODIUM CHLORIDE 0.9% FLUSH
3.0000 mL | Freq: Two times a day (BID) | INTRAVENOUS | Status: DC
  Administered 2020-08-03: 3 mL via INTRAVENOUS

## 2020-08-03 MED ORDER — LIDOCAINE HCL (PF) 1 % IJ SOLN
INTRAMUSCULAR | Status: DC | PRN
  Administered 2020-08-03: 2 mL via INTRADERMAL

## 2020-08-03 MED ORDER — VERAPAMIL HCL 2.5 MG/ML IV SOLN
INTRAVENOUS | Status: DC | PRN
  Administered 2020-08-03: 10 mL via INTRA_ARTERIAL

## 2020-08-03 MED ORDER — SODIUM CHLORIDE 0.9% FLUSH
3.0000 mL | INTRAVENOUS | Status: DC | PRN
Start: 2020-08-03 — End: 2020-08-04

## 2020-08-03 MED ORDER — FENTANYL CITRATE (PF) 100 MCG/2ML IJ SOLN
INTRAMUSCULAR | Status: DC | PRN
  Administered 2020-08-03: 25 ug via INTRAVENOUS

## 2020-08-03 MED ORDER — FENTANYL CITRATE (PF) 100 MCG/2ML IJ SOLN
INTRAMUSCULAR | Status: AC
  Filled 2020-08-03: qty 2

## 2020-08-03 MED ORDER — SODIUM CHLORIDE 0.9 % IV SOLN: INTRAVENOUS | Status: AC

## 2020-08-03 MED ORDER — SODIUM CHLORIDE 0.9% FLUSH
3.0000 mL | Freq: Two times a day (BID) | INTRAVENOUS | Status: DC
  Administered 2020-08-03 – 2020-08-04 (×2): 3 mL via INTRAVENOUS

## 2020-08-03 MED ORDER — HEPARIN SODIUM (PORCINE) 1000 UNIT/ML IJ SOLN
INTRAMUSCULAR | Status: DC | PRN
  Administered 2020-08-03: 5000 [IU] via INTRAVENOUS

## 2020-08-03 MED ORDER — HEPARIN (PORCINE) IN NACL 1000-0.9 UT/500ML-% IV SOLN
INTRAVENOUS | Status: DC | PRN
  Administered 2020-08-03 (×2): 500 mL

## 2020-08-03 MED ORDER — LIDOCAINE HCL (PF) 1 % IJ SOLN
INTRAMUSCULAR | Status: AC
  Filled 2020-08-03: qty 30

## 2020-08-03 MED ORDER — ENOXAPARIN SODIUM 40 MG/0.4ML ~~LOC~~ SOLN
40.0000 mg | SUBCUTANEOUS | Status: DC
  Filled 2020-08-03: qty 0.4

## 2020-08-03 MED ORDER — ENOXAPARIN SODIUM 40 MG/0.4ML ~~LOC~~ SOLN
40.0000 mg | SUBCUTANEOUS | Status: DC
  Administered 2020-08-03: 40 mg via SUBCUTANEOUS
  Filled 2020-08-03: qty 0.4

## 2020-08-03 MED ORDER — SODIUM CHLORIDE 0.9 % WEIGHT BASED INFUSION
3.0000 mL/kg/h | INTRAVENOUS | Status: DC
  Administered 2020-08-03: 3 mL/kg/h via INTRAVENOUS

## 2020-08-03 MED ORDER — SODIUM CHLORIDE 0.9% FLUSH: 3.0000 mL | INTRAVENOUS | Status: DC | PRN

## 2020-08-03 SURGICAL SUPPLY — 9 items
CATH INFINITI 5FR MULTPACK ANG (CATHETERS) ×1 IMPLANT
DEVICE RAD COMP TR BAND LRG (VASCULAR PRODUCTS) ×1 IMPLANT
GLIDESHEATH SLEND SS 6F .021 (SHEATH) ×1 IMPLANT
GUIDEWIRE INQWIRE 1.5J.035X260 (WIRE) IMPLANT
INQWIRE 1.5J .035X260CM (WIRE) ×2
KIT HEART LEFT (KITS) ×2 IMPLANT
PACK CARDIAC CATHETERIZATION (CUSTOM PROCEDURE TRAY) ×2 IMPLANT
TRANSDUCER W/STOPCOCK (MISCELLANEOUS) ×2 IMPLANT
TUBING CIL FLEX 10 FLL-RA (TUBING) ×2 IMPLANT

## 2020-08-03 NOTE — Progress Notes (Signed)
Vitals taken, 0cc of air removed, no new oozing observed, LUE sensation intact, warm to touch, elevated, denies pain. Will continue to monitor closely

## 2020-08-03 NOTE — Progress Notes (Signed)
Vitals taken, NO air removed at this interval, slight oozing observed around TR band, extremity is elevated, sensation/temp/color of LUE is intact, no pain in LUE. I will continue to monitor site closely.

## 2020-08-03 NOTE — H&P (Signed)
History and Physical    [name] HQI:[id number] DOB: 1941-01-13 DOA: 08/02/2020  PCP: Sigmund Hazel, MD  Patient coming from: Home.  Chief Complaint: Chest pain.  HPI: [name] [name] is a 80 y.o. male with history of COPD, hypertension who underwent recent pacemaker placement for syncope sick sinus syndrome has been experiencing exertional chest pain over the last 3 weeks.  Patient states he was diagnosed with Covid infection last month.  Was treated symptomatically at that time.  A week later patient started developing exertional chest pain.  Sometimes it is pleuritic.  Patient had followed up with his pulmonologist who referred patient to the ER.  ED Course: In the ER EKG shows paced rhythm with high sensitive troponins negative and chest x-ray was unremarkable.  Given the exertional nature of patient's symptoms patient admitted for further work-up for chest pain.  Labs show creatinine 1.3 high sensitive troponin was 4 and 5.  Repeat Covid test is negative but patient has had Covid last month.  Review of Systems: As per HPI, rest all negative.   Past Medical History:  Diagnosis Date  . Colon polyp March 2010   colonoscopy with Dr Randa Evens  . Elevated fasting glucose   . History of BPH    with obstruction and hydronephrosis and slihtly elevated creatinine, using self catherization, followed by urologist Dr McDiarmid  . History of ETT 2012   [name]  . History of tobacco use    54 pack yr  . HTN (hypertension)   . Paroxysmal supraventricular tachycardia (HCC)   . Screening for AAA (abdominal aortic aneurysm) 3/16   3cm, nl/early aneurysm, repeat 3 yrs with u/s 3/19    Past Surgical History:  Procedure Laterality Date  . APPENDECTOMY    . PACEMAKER IMPLANT N/A 03/02/2020   Procedure: PACEMAKER IMPLANT;  Surgeon: Lanier Prude, MD;  Location: [facility] INVASIVE CV LAB;  Service: Cardiovascular;  Laterality: N/A;  . REPLACEMENT TOTAL KNEE BILATERAL  [id number]  .  TRANSURETHRAL RESECTION OF PROSTATE  2010     reports that he quit smoking about 35 years ago. His smoking use included cigarettes. He has a 90.00 pack-year smoking history. He has never used smokeless tobacco. He reports current alcohol use. He reports that he does not use drugs.  Allergies  Allergen Reactions  . Nsaids     REACTION: abnormal kidney fuction    Family History  Problem Relation Age of Onset  . Kidney disease Father        ESRD  . Healthy Brother   . Healthy Brother   . Colon cancer Neg Hx   . Colon polyps Neg Hx     Prior to Admission medications   Medication Sig Start Date End Date Taking? Authorizing Provider  acetaminophen (TYLENOL) 325 MG tablet Take 650 mg by mouth every 6 (six) hours as needed for moderate pain or headache.   Yes [provider]  atorvastatin (LIPITOR) 10 MG tablet Take 10 mg by mouth daily. 07/25/19  Yes [provider]  calcium carbonate (TUMS - DOSED IN MG ELEMENTAL CALCIUM) 500 MG chewable tablet Chew 1 tablet by mouth 2 (two) times daily as needed for indigestion or heartburn.   Yes [provider]  losartan (COZAAR) 100 MG tablet Take 100 mg by mouth daily. 04/19/15  Yes [provider]  mometasone-formoterol (DULERA) 100-5 MCG/ACT AERO TAKE 2 PUFFS FIRST THING IN THE MORNING AND THEN ANOTHER 2 PUFFS ABOUT 12 HOURS LATER Patient taking differently:  Inhale 2 puffs into the lungs in the morning and at bedtime. 01/24/20  Yes Mannam, Praveen, MD  predniSONE (DELTASONE) 10 MG tablet 4 tabs for 2 days, then 3 tabs for 2 days, 2 tabs for 2 days, then 1 tab for 2 days, then stop 08/02/20  Yes Parrett, Tammy S, NP  trimethoprim (TRIMPEX) 100 MG tablet Take 100 mg by mouth daily. 06/04/15  Yes [provider]    Physical Exam: Constitutional: Moderately built and nourished. Vitals:   08/02/20 2330 08/03/20 0000 08/03/20 0009 08/03/20 0055  BP: (!) 152/85 133/84  (!) 157/89  Pulse: (!) 58 62  63  Resp: 17  14  16   Temp:   97.7 F (36.5 C) 97.7 F (36.5 C)  TempSrc:   Oral Oral  SpO2: 98% 97%  99%  Weight:      Height:       Eyes: Anicteric no pallor. ENMT: No discharge from the ears eyes nose or mouth. Neck: No mass felt.  No neck rigidity. Respiratory: No rhonchi or crepitations. Cardiovascular: S1-S2 heard. Abdomen: Soft nontender bowel sounds present. Musculoskeletal: No edema. Skin: No rash. Neurologic: Alert awake oriented to time place and person.  Moves all extremities. Psychiatric: Appears normal.  Normal affect.   Labs on Admission: I have personally reviewed following labs and imaging studies  CBC: Recent Labs  Lab 08/02/20 1340  WBC 10.3  HGB 14.8  HCT 47.2  MCV 89.7  PLT 200   Basic Metabolic Panel: Recent Labs  Lab 08/02/20 1340  NA 135  K 4.5  CL 102  CO2 23  GLUCOSE 88  BUN 17  CREATININE 1.39*  CALCIUM 8.9   GFR: Estimated Creatinine Clearance: 51.2 mL/min (A) (by C-G formula based on SCr of 1.39 mg/dL (H)). Liver Function Tests: No results for input(s): AST, ALT, ALKPHOS, BILITOT, PROT, ALBUMIN in the last 168 hours. No results for input(s): LIPASE, AMYLASE in the last 168 hours. No results for input(s): AMMONIA in the last 168 hours. Coagulation Profile: No results for input(s): INR, PROTIME in the last 168 hours. Cardiac Enzymes: No results for input(s): CKTOTAL, CKMB, CKMBINDEX, TROPONINI in the last 168 hours. BNP (last 3 results) No results for input(s): PROBNP in the last 8760 hours. HbA1C: No results for input(s): HGBA1C in the last 72 hours. CBG: No results for input(s): GLUCAP in the last 168 hours. Lipid Profile: No results for input(s): CHOL, HDL, LDLCALC, TRIG, CHOLHDL, LDLDIRECT in the last 72 hours. Thyroid Function Tests: No results for input(s): TSH, T4TOTAL, FREET4, T3FREE, THYROIDAB in the last 72 hours. Anemia Panel: No results for input(s): VITAMINB12, FOLATE, FERRITIN, TIBC, IRON, RETICCTPCT in the last 72  hours. Urine analysis: No results found for: COLORURINE, APPEARANCEUR, LABSPEC, PHURINE, GLUCOSEU, HGBUR, BILIRUBINUR, KETONESUR, PROTEINUR, UROBILINOGEN, NITRITE, LEUKOCYTESUR Sepsis Labs: @LABRCNTIP (procalcitonin:4,lacticidven:4) )No results found for this or any previous visit (from the past 240 hour(s)).   Radiological Exams on Admission: DG Chest 2 View  Result Date: 08/02/2020 CLINICAL DATA:  Chest pain for 2-3 weeks EXAM: CHEST - 2 VIEW COMPARISON:  03/02/2020 FINDINGS: Cardiac shadow is within normal limits. Pacing device is again seen and stable. The lungs are well aerated bilaterally. Mild atelectatic changes are noted posteriorly projecting in the right lower lobe. No effusion or pneumothorax is seen. No bony abnormality is noted. IMPRESSION: Mild right basilar atelectasis. Electronically Signed   By: 10/02/2020 M.D.   On: 08/02/2020 13:53    EKG: Independently reviewed.  Paced rhythm.  Assessment/Plan Principal  Problem:   Chest pain Active Problems:   COPD, group B, by GOLD 2017 classification [facility])   Essential hypertension   Pacemaker    1. Chest pain with exertional symptoms concerning for angina.  High sensitive troponins were negative.  We will keep patient on aspirin consult cardiology.  N.p.o. past midnight.  Check D-dimer. 2. Hypertension on Cozaar. 3. Hyperlipidemia on statins. 4. COPD not actively wheezing. 5. Recent Covid infection last month was treated symptomatically. 6. Chronic kidney disease stage III with patient doing self cath on chronic suppressive antibiotics.  Covid test is pending also patient was positive for Covid last month.  Presently asymptomatic.   DVT prophylaxis: Lovenox. Code Status: Full code. Family Communication: Discussed with patient. Disposition Plan: Home. Consults called: Cardiology. Admission status: Observation.   Eduard Clos MD [facility] Pager [phone].  If 7PM-7AM, please contact  night-coverage [email or web address] Password [email or web address]  08/03/2020, 1:51 AM

## 2020-08-03 NOTE — Progress Notes (Signed)
3 cc of air removed, vitals taken, patient expresses's no pain. No bleeding from TR band site.

## 2020-08-03 NOTE — Interval H&P Note (Signed)
History and Physical Interval Note:  08/03/2020 1:04 PM  [name] [name]  has presented today for surgery, with the diagnosis of UNSTABLE ANGINA.  The various methods of treatment have been discussed with the patient and family. After consideration of risks, benefits and other options for treatment, the patient has consented to  Procedure(s): LEFT HEART CATH AND CORONARY ANGIOGRAPHY (N/A) as a surgical intervention.  The patient's history has been reviewed, patient examined, no change in status, stable for surgery.  I have reviewed the patient's chart and labs.  Questions were answered to the patient's satisfaction.    Cath Lab Visit (complete for each Cath Lab visit)  Clinical Evaluation Leading to the Procedure:   ACS: No.  Non-ACS:    Anginal Classification: CCS III  Anti-ischemic medical therapy: Minimal Therapy (1 class of medications)  Non-Invasive Test Results: No non-invasive testing performed  Prior CABG: No previous CABG        Verne Carrow

## 2020-08-03 NOTE — Progress Notes (Signed)
3cc of air removed, no new oozing observed, denies pain, full sensation in LUE, vitals taken.

## 2020-08-03 NOTE — H&P (View-Only) (Signed)
Cardiology Consultation:   Patient ID: [name] [name] MRN: [id number]; DOB: 1940-06-28  Admit date: 08/02/2020 Date of Consult: 08/03/2020  PCP:  Sigmund Hazel, MD   [facility]  Cardiologist:  Reatha Harps, MD  Electrophysiology: Dr. Lalla Brothers  Patient Profile:   [name] [name] is a 80 y.o. male with a hx of hypertension, SSS s/p PPM, COPD, current smoker, hyperlipidemia, and bladder dysfunction requiring self-cath who is being seen today for the evaluation of chest pain at the request of Dr. Margo Aye.  History of Present Illness:   [name] followed with Dr. Katrinka Blazing in 2017 for dyspnea. POET did not show any evidence of ischemia. He does have a history of PSVT during a treadmill test in 2012, but no further episodes. Echo for dyspnea with normal EF, mild DD, mild MR. Symptoms felt to be related to newly diagnosed COPD. He saw Dr. Flora Lipps in 2021 after a syncopal event. He wore a zio patch  that showed high grade AV conduction disease and he was referred to EP. he had PPM placed 03/02/20 for SSS and high grade AV block and all symptoms have improved.  Echocardiogram showed EF 60-65%, and mild MR. 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   He contracted COVID-19 in Feb 2022 and recovered without hospitalization. However, he started having chest pain about 1 week later. CP is described as a burning sensation that occurs with walking.   HS troponin x 2 negative EKG withRBBB D-dimer negative CXR mild right basilar atelectasis  During my interview, he reports COVID infection about a month ago. He tested due to fever and slightly worse cough. He had only mild symptoms, history of vaccination. However, about 1 week later, he started having chest burning when he walked his dog (walks 30-45 min but not strenuous walk).  He attributed the chest pain with the cold weather. He continued walking and the chest pain subsided after  about 15 min. He has continued having chest burning nearly daily when walking, worse in cold weather. CP is not positional or pleuritic. CP has progressed to now occurring sometimes at rest. CP at rest lasts about 5 minutes. No other associated symptoms, no radiation. He thought the CP was related to hi COPD and saw pulmonology yesterday, who referred him to the ER for further evaluation. He had one bout of chest pain when walking to the stretcher in triage. He had another bout of chest pain at rest in bed last night, lasted about 5 min. He has not received nitro.   Current smoker, HTN, HLD, no family history.    Past Medical History:  Diagnosis Date  . Colon polyp March 2010   colonoscopy with Dr Randa Evens  . Elevated fasting glucose   . History of BPH    with obstruction and hydronephrosis and slihtly elevated creatinine, using self catherization, followed by urologist Dr McDiarmid  . History of ETT 2012   [name]  . History of tobacco use    54 pack yr  . HTN (hypertension)   . Paroxysmal supraventricular tachycardia (HCC)   . Screening for AAA (abdominal aortic aneurysm) 3/16   3cm, nl/early aneurysm, repeat 3 yrs with u/s 3/19    Past Surgical History:  Procedure Laterality Date  . APPENDECTOMY    . PACEMAKER IMPLANT N/A 03/02/2020   Procedure: PACEMAKER IMPLANT;  Surgeon: Lanier Prude, MD;  Location: [facility] INVASIVE CV  LAB;  Service: Cardiovascular;  Laterality: N/A;  . REPLACEMENT TOTAL KNEE BILATERAL  [id number]  . TRANSURETHRAL RESECTION OF PROSTATE  2010     Home Medications:  Prior to Admission medications   Medication Sig Start Date End Date Taking? Authorizing Provider  acetaminophen (TYLENOL) 325 MG tablet Take 650 mg by mouth every 6 (six) hours as needed for moderate pain or headache.   Yes [provider]  atorvastatin (LIPITOR) 10 MG tablet Take 10 mg by mouth daily. 07/25/19  Yes [provider]  calcium carbonate (TUMS - DOSED IN MG ELEMENTAL  CALCIUM) 500 MG chewable tablet Chew 1 tablet by mouth 2 (two) times daily as needed for indigestion or heartburn.   Yes [provider]  losartan (COZAAR) 100 MG tablet Take 100 mg by mouth daily. 04/19/15  Yes [provider]  mometasone-formoterol (DULERA) 100-5 MCG/ACT AERO TAKE 2 PUFFS FIRST THING IN THE MORNING AND THEN ANOTHER 2 PUFFS ABOUT 12 HOURS LATER Patient taking differently: Inhale 2 puffs into the lungs in the morning and at bedtime. 01/24/20  Yes Mannam, Praveen, MD  predniSONE (DELTASONE) 10 MG tablet 4 tabs for 2 days, then 3 tabs for 2 days, 2 tabs for 2 days, then 1 tab for 2 days, then stop 08/02/20  Yes Parrett, Tammy S, NP  trimethoprim (TRIMPEX) 100 MG tablet Take 100 mg by mouth daily. 06/04/15  Yes [provider]    Inpatient Medications: Scheduled Meds: . aspirin EC  81 mg Oral Daily  . atorvastatin  10 mg Oral Daily  . enoxaparin (LOVENOX) injection  40 mg Subcutaneous Q24H  . losartan  100 mg Oral Daily  . mometasone-formoterol  2 puff Inhalation BID  . trimethoprim  100 mg Oral Daily   Continuous Infusions:  PRN Meds:   Allergies:    Allergies  Allergen Reactions  . Nsaids     REACTION: abnormal kidney fuction    Social History:   Social History   Socioeconomic History  . Marital status: Married    Spouse name: Not on file  . Number of children: Not on file  . Years of education: Not on file  . Highest education level: Not on file  Occupational History  . Occupation: retired  Tobacco Use  . Smoking status: Former Smoker    Packs/day: 3.00    Years: 30.00    Pack years: 90.00    Types: Cigarettes    Quit date: 06/02/1985    Years since quitting: 35.1  . Smokeless tobacco: Never Used  Vaping Use  . Vaping Use: Never used  Substance and Sexual Activity  . Alcohol use: Yes    Alcohol/week: 0.0 standard drinks    Comment: daily wine, occasional vodka  . Drug use: Never  . Sexual activity: Not on file  Other Topics  Concern  . Not on file  Social History Narrative   Caffeine: yes   No diet   Exercise: yes   Married to Warner   2 children   Retired - Education officer, environmental, with extensive traveling   Social Determinants of Corporate investment banker Strain: Not on [employer] Insecurity: Not on file  Transportation Needs: Not on file  Physical Activity: Not on file  Stress: Not on file  Social Connections: Not on file  Intimate Partner Violence: Not on file    Family History:    Family History  Problem Relation Age of Onset  . Kidney disease Father  ESRD  . Healthy Brother   . Healthy Brother   . Colon cancer Neg Hx   . Colon polyps Neg Hx      ROS:  Please see the history of present illness.   All other ROS reviewed and negative.     Physical Exam/Data:   Vitals:   08/03/20 0009 08/03/20 0055 08/03/20 0510 08/03/20 0800  BP:  (!) 157/89 (!) 149/88   Pulse:  63 65 67  Resp:  16 16 20   Temp: 97.7 F (36.5 C) 97.7 F (36.5 C) 97.7 F (36.5 C)   TempSrc: Oral Oral Oral   SpO2:  99% 95% 95%  Weight:      Height:        Intake/Output Summary (Last 24 hours) at 08/03/2020 0859 Last data filed at 08/03/2020 0130 Gross per 24 hour  Intake 240 ml  Output --  Net 240 ml   Last 3 Weights 08/02/2020 08/02/2020 06/06/2020  Weight (lbs) 214 lb 1.1 oz 214 lb 218 lb 12.8 oz  Weight (kg) 97.1 kg 97.07 kg 99.247 kg     Body mass index is 29.03 kg/m.  General:  Male appears younger than stated age HEENT: normal Neck: no JVD Vascular: No carotid bruits Cardiac:  normal S1, S2; RRR; no murmur  Lungs:  clear to auscultation bilaterally, no wheezing, rhonchi or rales  Abd: soft, nontender, no hepatomegaly  Ext: no edema Musculoskeletal:  No deformities, BUE and BLE strength normal and equal Skin: warm and dry  Neuro:  CNs 2-12 intact, no focal abnormalities noted Psych:  Normal affect   EKG:  The EKG was personally reviewed and demonstrates:  RBBB Telemetry:  Telemetry was  personally reviewed and demonstrates:  A-V pacing  Relevant CV Studies:  Echo pending  Echo 03/02/20: 1. Left ventricular ejection fraction, by estimation, is 60 to 65%. The  left ventricle has normal function. The left ventricle has no regional  wall motion abnormalities. Left ventricular diastolic parameters are  indeterminate.  2. Right ventricular systolic function is normal. The right ventricular  size is normal.  3. The mitral valve is normal in structure. Mild mitral valve  regurgitation.  4. The aortic valve is tricuspid. Aortic valve regurgitation is not  visualized. No aortic stenosis is present.  5. Aortic dilatation noted. There is mild to moderate dilatation of the  ascending aorta, measuring 42 mm.  6. The inferior vena cava is normal in size with greater than 50%  respiratory variability, suggesting right atrial pressure of 3 mmHg.   Laboratory Data:  High Sensitivity Troponin:   Recent Labs  Lab 08/02/20 1340 08/02/20 1640  TROPONINIHS 4 5     Chemistry Recent Labs  Lab 08/02/20 1340 08/03/20 0543  NA 135 135  K 4.5 4.5  CL 102 103  CO2 23 23  GLUCOSE 88 99  BUN 17 17  CREATININE 1.39* 1.39*  CALCIUM 8.9 8.7*  GFRNONAA 51* 51*  ANIONGAP 10 9    No results for input(s): PROT, ALBUMIN, AST, ALT, ALKPHOS, BILITOT in the last 168 hours. Hematology Recent Labs  Lab 08/02/20 1340 08/03/20 0543  WBC 10.3 8.7  RBC 5.26 4.89  HGB 14.8 14.0  HCT 47.2 42.4  MCV 89.7 86.7  MCH 28.1 28.6  MCHC 31.4 33.0  RDW 13.1 13.2  PLT 200 174   BNPNo results for input(s): BNP, PROBNP in the last 168 hours.  DDimer  Recent Labs  Lab 08/03/20 0543  DDIMER 0.39  Radiology/Studies:  DG Chest 2 View  Result Date: 08/02/2020 CLINICAL DATA:  Chest pain for 2-3 weeks EXAM: CHEST - 2 VIEW COMPARISON:  03/02/2020 FINDINGS: Cardiac shadow is within normal limits. Pacing device is again seen and stable. The lungs are well aerated bilaterally. Mild  atelectatic changes are noted posteriorly projecting in the right lower lobe. No effusion or pneumothorax is seen. No bony abnormality is noted. IMPRESSION: Mild right basilar atelectasis. Electronically Signed   By: Alcide CleverMark  Lukens M.D.   On: 08/02/2020 13:53     Assessment and Plan:   Chest pain - he does not have a known history of CAD and has had remote negative exercise treadmill tests - risk factors include HTN, HLD, and smoking - CE negative, EKG with RBBB - CRP and sed rate pending - recent COVID infection 1 week before onset of symptoms - symptoms are concerning for progressive angina - CP with exertion and now at rest - CP described as somewhat atypical; however, given risk factors, favor definitive angiography   Hypertension - maintained on 100 mg losartan - pressure well-controlled   SSS s/p PPM - A-V pacing on telemetry - no further syncope   Hyperlipidemia - 10 mg lipitor - collect fasting lipids   CKD stage II-III - sCr 1.39 - baseline appears to be 1.2-1.3 - will gently hydrate in anticipation of NPO status   Risk stratification - collect A1c and fasting lipids - fasting glucose this morning was 99 - recommend smoking cessation   Given his risk factors and symptoms concerning for angina, would prefer to proceed with definitive angiography today. I will start gentle fluids for CKD.    Risk Assessment/Risk Scores:     HEAR Score (for undifferentiated chest pain):  HEAR Score: 5        For questions or updates, please contact [facility] Please consult [email or web address] for contact info under    Signed, Marcelino Dusterngela Nicole Adalis Gatti, PA  08/03/2020 8:59 AM

## 2020-08-03 NOTE — Plan of Care (Signed)
IP received phone call  About this Pt. Per previous notes from infectious disease NP..............Marland Kitchenpatient about COVID-19 symptoms and the use of one of the available treatments for those with mild to moderate Covid symptoms and at a high risk of hospitalization. Pt appears to qualify for outpatient treatment due to co-morbid conditions and/or a member of an at-risk group in accordance with the FDA Emergency Use Authorization. Symptom onset:2/1Vaccinated:yes". Discussed pt with Dr. Drue Second. Pt should not be on isolation on 08/03/2020. ee

## 2020-08-03 NOTE — Progress Notes (Signed)
3cc of air removed, site clean dry intact, patient expresses no pain. Full sensation in left upper extremity.

## 2020-08-03 NOTE — Progress Notes (Signed)
3cc of air removed, no complications, no pain, full sensation in LUE, vitals taken.

## 2020-08-03 NOTE — Progress Notes (Signed)
Received call from ID patient is out of isolation window and does not need the order, see prior note.

## 2020-08-03 NOTE — Progress Notes (Addendum)
[name] [name] is a 80 y.o. male with history of COPD, hypertension who underwent recent pacemaker placement for syncope sick sinus syndrome has been experiencing exertional chest pain over the last 3 weeks.  Patient states he was diagnosed with Covid infection last month.  Was treated symptomatically at that time.  A week later patient started developing exertional chest pain.  Sometimes it is pleuritic.  Patient had followed up with his pulmonologist who referred patient to the ER.  ED Course: In the ER EKG shows paced rhythm with high sensitive troponins negative and chest x-ray was unremarkable.  Given the exertional nature of patient's symptoms patient admitted for further work-up for chest pain.  Labs show creatinine 1.3 high sensitive troponin was 4 and 5.  Repeat Covid test is positive but patient has had Covid last month.  08/03/20: Seen and examined at his bedside.  Seen prior to heart cath.  He reports no chest pain at the time of this visit.  Left heart cath and coronary angiography revealed mild nonobstructive CAD, normal LV filling pressure.  Recommendations for medical management of mild nonobstructive coronary artery disease, per cardiology.  COVID-19 screening test positive on 08/03/2020.  The patient is vaccinated x3 with Pfizer vaccine.  Continue airborne precautions.  Patient is asymptomatic afebrile, not hypoxic, O2 saturation 94 to 96% on room air.  Chest x-ray showing mild right basilar atelectasis.  We will continue to monitor overnight.  Will DC to home once okay with cardiology.  Please refer to H&P dictated by my partner Dr. Toniann Fail on 08/03/2020 for further details of the assessment and plan.  Addendum: Per cardiology, Dr. Flora Lipps, patient can go home after his TR band comes off.  No plan for further cardiac testing.  No evidence of pericarditis as inflammatory markers are negative.  His symptoms could be pulmonary related.  Will obtain a home oxygen evaluation likely in the  morning.

## 2020-08-03 NOTE — Progress Notes (Signed)
3cc of air removed, vitals taken, no new oozing observed, denies pain, full sensation in LUE and warm to touch, This is the last of air in TR band. TR band is left in place, completely deflated.

## 2020-08-03 NOTE — Progress Notes (Signed)
3cc of air out, vitals taken. No bleeding observed.. 15cc of air was placed at 1335 in cath lab, per report from cath lab RN.

## 2020-08-03 NOTE — Consult Note (Signed)
Cardiology Consultation:   Patient ID: [name] [name] MRN: [id number]; DOB: 1940-06-28  Admit date: 08/02/2020 Date of Consult: 08/03/2020  PCP:  [name] Hazel, MD   [facility]  Cardiologist:  [name] Harps, MD  Electrophysiology: Dr. Lalla [name]  Patient Profile:   [name] [name] is a 80 y.o. male with a hx of hypertension, SSS s/p PPM, COPD, current smoker, hyperlipidemia, and bladder dysfunction requiring self-cath who is being seen today for the evaluation of chest pain at the request of Dr. Margo [name].  History of Present Illness:   [name] [name] followed with Dr. Katrinka [name] in 2017 for dyspnea. POET did not show any evidence of ischemia. He does have a history of PSVT during a treadmill test in 2012, but no further episodes. Echo for dyspnea with normal EF, mild DD, mild MR. Symptoms felt to be related to newly diagnosed COPD. He saw Dr. Flora [name] in 2021 after a syncopal event. He wore a zio patch  that showed high grade AV conduction disease and he was referred to EP. he had PPM placed 03/02/20 for SSS and high grade AV block and all symptoms have improved.  Echocardiogram showed EF 60-65%, and mild MR. 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   He contracted COVID-19 in Feb 2022 and recovered without hospitalization. However, he started having chest pain about 1 week later. CP is described as a burning sensation that occurs with walking.   HS troponin x 2 negative EKG withRBBB D-dimer negative CXR mild right basilar atelectasis  During my interview, he reports COVID infection about a month ago. He tested due to fever and slightly worse cough. He had only mild symptoms, history of vaccination. However, about 1 week later, he started having chest burning when he walked his dog (walks 30-45 min but not strenuous walk).  He attributed the chest pain with the cold weather. He continued walking and the chest pain subsided after  about 15 min. He has continued having chest burning nearly daily when walking, worse in cold weather. CP is not positional or pleuritic. CP has progressed to now occurring sometimes at rest. CP at rest lasts about 5 minutes. No other associated symptoms, no radiation. He thought the CP was related to hi COPD and saw pulmonology yesterday, who referred him to the ER for further evaluation. He had one bout of chest pain when walking to the stretcher in triage. He had another bout of chest pain at rest in bed last night, lasted about 5 min. He has not received nitro.   Current smoker, HTN, HLD, no family history.    Past Medical History:  Diagnosis Date  . Colon polyp March 2010   colonoscopy with Dr [name] [name]  . Elevated fasting glucose   . History of BPH    with obstruction and hydronephrosis and slihtly elevated creatinine, using self catherization, followed by urologist Dr [name]  . History of ETT 2012   [name] [name] nml  . History of tobacco use    54 pack yr  . HTN (hypertension)   . Paroxysmal supraventricular tachycardia (HCC)   . Screening for AAA (abdominal aortic aneurysm) 3/16   3cm, nl/early aneurysm, repeat 3 yrs with u/s 3/19    Past Surgical History:  Procedure Laterality Date  . APPENDECTOMY    . PACEMAKER IMPLANT N/A 03/02/2020   Procedure: PACEMAKER IMPLANT;  Surgeon: [name] Prude, MD;  Location: [facility] INVASIVE CV  LAB;  Service: Cardiovascular;  Laterality: N/A;  . REPLACEMENT TOTAL KNEE BILATERAL  [id number]  . TRANSURETHRAL RESECTION OF PROSTATE  2010     Home Medications:  Prior to Admission medications   Medication Sig Start Date End Date Taking? Authorizing Provider  acetaminophen (TYLENOL) 325 MG tablet Take 650 mg by mouth every 6 (six) hours as needed for moderate pain or headache.   Yes [provider]  atorvastatin (LIPITOR) 10 MG tablet Take 10 mg by mouth daily. 07/25/19  Yes [provider]  calcium carbonate (TUMS - DOSED IN MG ELEMENTAL  CALCIUM) 500 MG chewable tablet Chew 1 tablet by mouth 2 (two) times daily as needed for indigestion or heartburn.   Yes [provider]  losartan (COZAAR) 100 MG tablet Take 100 mg by mouth daily. 04/19/15  Yes [provider]  mometasone-formoterol (DULERA) 100-5 MCG/ACT AERO TAKE 2 PUFFS FIRST THING IN THE MORNING AND THEN ANOTHER 2 PUFFS ABOUT 12 HOURS LATER Patient taking differently: Inhale 2 puffs into the lungs in the morning and at bedtime. 01/24/20  Yes Mannam, Praveen, MD  predniSONE (DELTASONE) 10 MG tablet 4 tabs for 2 days, then 3 tabs for 2 days, 2 tabs for 2 days, then 1 tab for 2 days, then stop 08/02/20  Yes Parrett, Tammy S, NP  trimethoprim (TRIMPEX) 100 MG tablet Take 100 mg by mouth daily. 06/04/15  Yes [provider]    Inpatient Medications: Scheduled Meds: . aspirin EC  81 mg Oral Daily  . atorvastatin  10 mg Oral Daily  . enoxaparin (LOVENOX) injection  40 mg Subcutaneous Q24H  . losartan  100 mg Oral Daily  . mometasone-formoterol  2 puff Inhalation BID  . trimethoprim  100 mg Oral Daily   Continuous Infusions:  PRN Meds:   Allergies:    Allergies  Allergen Reactions  . Nsaids     REACTION: abnormal kidney fuction    Social History:   Social History   Socioeconomic History  . Marital status: Married    Spouse name: Not on file  . Number of children: Not on file  . Years of education: Not on file  . Highest education level: Not on file  Occupational History  . Occupation: retired  Tobacco Use  . Smoking status: Former Smoker    Packs/day: 3.00    Years: 30.00    Pack years: 90.00    Types: Cigarettes    Quit date: 06/02/1985    Years since quitting: 35.1  . Smokeless tobacco: Never Used  Vaping Use  . Vaping Use: Never used  Substance and Sexual Activity  . Alcohol use: Yes    Alcohol/week: 0.0 standard drinks    Comment: daily wine, occasional vodka  . Drug use: Never  . Sexual activity: Not on file  Other Topics  Concern  . Not on file  Social History Narrative   Caffeine: yes   No diet   Exercise: yes   Married to [name] [name]   2 children   Retired - Education officer, environmental, with extensive traveling   Social Determinants of Corporate investment banker Strain: Not on [employer] Insecurity: Not on file  Transportation Needs: Not on file  Physical Activity: Not on file  Stress: Not on file  Social Connections: Not on file  Intimate Partner Violence: Not on file    Family History:    Family History  Problem Relation Age of Onset  . Kidney disease Father  ESRD  . Healthy Brother   . Healthy Brother   . Colon cancer Neg Hx   . Colon polyps Neg Hx      ROS:  Please see the history of present illness.   All other ROS reviewed and negative.     Physical Exam/Data:   Vitals:   08/03/20 0009 08/03/20 0055 08/03/20 0510 08/03/20 0800  BP:  (!) 157/89 (!) 149/88   Pulse:  63 65 67  Resp:  16 16 20   Temp: 97.7 F (36.5 C) 97.7 F (36.5 C) 97.7 F (36.5 C)   TempSrc: Oral Oral Oral   SpO2:  99% 95% 95%  Weight:      Height:        Intake/Output Summary (Last 24 hours) at 08/03/2020 0859 Last data filed at 08/03/2020 0130 Gross per 24 hour  Intake 240 ml  Output --  Net 240 ml   Last 3 Weights 08/02/2020 08/02/2020 06/06/2020  Weight (lbs) 214 lb 1.1 oz 214 lb 218 lb 12.8 oz  Weight (kg) 97.1 kg 97.07 kg 99.247 kg     Body mass index is 29.03 kg/m.  General:  Male appears younger than stated age HEENT: normal Neck: no JVD Vascular: No carotid bruits Cardiac:  normal S1, S2; RRR; no murmur  Lungs:  clear to auscultation bilaterally, no wheezing, rhonchi or rales  Abd: soft, nontender, no hepatomegaly  Ext: no edema Musculoskeletal:  No deformities, BUE and BLE strength normal and equal Skin: warm and dry  Neuro:  CNs 2-12 intact, no focal abnormalities noted Psych:  Normal affect   EKG:  The EKG was personally reviewed and demonstrates:  RBBB Telemetry:  Telemetry was  personally reviewed and demonstrates:  A-V pacing  Relevant CV Studies:  Echo pending  Echo 03/02/20: 1. Left ventricular ejection fraction, by estimation, is 60 to 65%. The  left ventricle has normal function. The left ventricle has no regional  wall motion abnormalities. Left ventricular diastolic parameters are  indeterminate.  2. Right ventricular systolic function is normal. The right ventricular  size is normal.  3. The mitral valve is normal in structure. Mild mitral valve  regurgitation.  4. The aortic valve is tricuspid. Aortic valve regurgitation is not  visualized. No aortic stenosis is present.  5. Aortic dilatation noted. There is mild to moderate dilatation of the  ascending aorta, measuring 42 mm.  6. The inferior vena cava is normal in size with greater than 50%  respiratory variability, suggesting right atrial pressure of 3 mmHg.   Laboratory Data:  High Sensitivity Troponin:   Recent Labs  Lab 08/02/20 1340 08/02/20 1640  TROPONINIHS 4 5     Chemistry Recent Labs  Lab 08/02/20 1340 08/03/20 0543  NA 135 135  K 4.5 4.5  CL 102 103  CO2 23 23  GLUCOSE 88 99  BUN 17 17  CREATININE 1.39* 1.39*  CALCIUM 8.9 8.7*  GFRNONAA 51* 51*  ANIONGAP 10 9    No results for input(s): PROT, ALBUMIN, AST, ALT, ALKPHOS, BILITOT in the last 168 hours. Hematology Recent Labs  Lab 08/02/20 1340 08/03/20 0543  WBC 10.3 8.7  RBC 5.26 4.89  HGB 14.8 14.0  HCT 47.2 42.4  MCV 89.7 86.7  MCH 28.1 28.6  MCHC 31.4 33.0  RDW 13.1 13.2  PLT 200 174   BNPNo results for input(s): BNP, PROBNP in the last 168 hours.  DDimer  Recent Labs  Lab 08/03/20 0543  DDIMER 0.39  Radiology/Studies:  DG Chest 2 View  Result Date: 08/02/2020 CLINICAL DATA:  Chest pain for 2-3 weeks EXAM: CHEST - 2 VIEW COMPARISON:  03/02/2020 FINDINGS: Cardiac shadow is within normal limits. Pacing device is again seen and stable. The lungs are well aerated bilaterally. Mild  atelectatic changes are noted posteriorly projecting in the right lower lobe. No effusion or pneumothorax is seen. No bony abnormality is noted. IMPRESSION: Mild right basilar atelectasis. Electronically Signed   By: Mark  Lukens M.D.   On: 08/02/2020 13:53     Assessment and Plan:   Chest pain - he does not have a known history of CAD and has had remote negative exercise treadmill tests - risk factors include HTN, HLD, and smoking - CE negative, EKG with RBBB - CRP and sed rate pending - recent COVID infection 1 week before onset of symptoms - symptoms are concerning for progressive angina - CP with exertion and now at rest - CP described as somewhat atypical; however, given risk factors, favor definitive angiography   Hypertension - maintained on 100 mg losartan - pressure well-controlled   SSS s/p PPM - A-V pacing on telemetry - no further syncope   Hyperlipidemia - 10 mg lipitor - collect fasting lipids   CKD stage II-III - sCr 1.39 - baseline appears to be 1.2-1.3 - will gently hydrate in anticipation of NPO status   Risk stratification - collect A1c and fasting lipids - fasting glucose this morning was 99 - recommend smoking cessation   Given his risk factors and symptoms concerning for angina, would prefer to proceed with definitive angiography today. I will start gentle fluids for CKD.    Risk Assessment/Risk Scores:     HEAR Score (for undifferentiated chest pain):  HEAR Score: 5        For questions or updates, please contact [facility] Please consult [email or web address] for contact info under    Signed, Deyani Hegarty Nicole Lindell Tussey, PA  08/03/2020 8:59 AM  

## 2020-08-03 NOTE — Progress Notes (Signed)
Patient back from procedure.

## 2020-08-03 NOTE — Progress Notes (Signed)
Called for report to 5w, charge RN states she is talking with MD about criteria for transfer to 5w. No transfer as of yet.

## 2020-08-03 NOTE — Progress Notes (Signed)
0cc of air removed at this interval, vitals taken, patient expresses no distress, LUE full sensation/warm to touch, o2 levels taken on left thumb, no new oozing noted at TR band site, will continue to monitor site.

## 2020-08-03 NOTE — Progress Notes (Signed)
Patient off floor for procedure 

## 2020-08-03 NOTE — Progress Notes (Signed)
Patient arrived to unit 1st set of vitals taken, patient expresses no discomfort in left arm/hand.

## 2020-08-03 NOTE — Plan of Care (Signed)

## 2020-08-03 NOTE — Progress Notes (Signed)
  Echocardiogram 2D Echocardiogram has been performed.  [name] [name] 08/03/2020, 3:22 PM

## 2020-08-04 DIAGNOSIS — I25110 Atherosclerotic heart disease of native coronary artery with unstable angina pectoris: Secondary | ICD-10-CM | POA: Diagnosis not present

## 2020-08-04 DIAGNOSIS — I200 Unstable angina: Secondary | ICD-10-CM | POA: Diagnosis not present

## 2020-08-04 LAB — CBC WITH DIFFERENTIAL/PLATELET
Abs Immature Granulocytes: 0.02 10*3/uL (ref 0.00–0.07)
Basophils Absolute: 0 10*3/uL (ref 0.0–0.1)
Basophils Relative: 1 %
Eosinophils Absolute: 0.2 10*3/uL (ref 0.0–0.5)
Eosinophils Relative: 2 %
HCT: 40.9 % (ref 39.0–52.0)
Hemoglobin: 13.6 g/dL (ref 13.0–17.0)
Immature Granulocytes: 0 %
Lymphocytes Relative: 43 %
Lymphs Abs: 3.3 10*3/uL (ref 0.7–4.0)
MCH: 29 pg (ref 26.0–34.0)
MCHC: 33.3 g/dL (ref 30.0–36.0)
MCV: 87.2 fL (ref 80.0–100.0)
Monocytes Absolute: 0.7 10*3/uL (ref 0.1–1.0)
Monocytes Relative: 9 %
Neutro Abs: 3.5 10*3/uL (ref 1.7–7.7)
Neutrophils Relative %: 45 %
Platelets: 147 10*3/uL — ABNORMAL LOW (ref 150–400)
RBC: 4.69 MIL/uL (ref 4.22–5.81)
RDW: 13.4 % (ref 11.5–15.5)
WBC: 7.6 10*3/uL (ref 4.0–10.5)
nRBC: 0 % (ref 0.0–0.2)

## 2020-08-04 LAB — COMPREHENSIVE METABOLIC PANEL
ALT: 19 U/L (ref 0–44)
AST: 20 U/L (ref 15–41)
Albumin: 3.1 g/dL — ABNORMAL LOW (ref 3.5–5.0)
Alkaline Phosphatase: 53 U/L (ref 38–126)
Anion gap: 6 (ref 5–15)
BUN: 19 mg/dL (ref 8–23)
CO2: 25 mmol/L (ref 22–32)
Calcium: 8.4 mg/dL — ABNORMAL LOW (ref 8.9–10.3)
Chloride: 106 mmol/L (ref 98–111)
Creatinine, Ser: 1.54 mg/dL — ABNORMAL HIGH (ref 0.61–1.24)
GFR, Estimated: 45 mL/min — ABNORMAL LOW (ref >60)
Glucose, Bld: 107 mg/dL — ABNORMAL HIGH (ref 70–99)
Potassium: 4.7 mmol/L (ref 3.5–5.1)
Sodium: 137 mmol/L (ref 135–145)
Total Bilirubin: 0.9 mg/dL (ref 0.3–1.2)
Total Protein: 5.9 g/dL — ABNORMAL LOW (ref 6.5–8.1)

## 2020-08-04 LAB — D-DIMER, QUANTITATIVE: D-Dimer, Quant: 0.36 ug/mL-FEU (ref 0.00–0.50)

## 2020-08-04 LAB — C-REACTIVE PROTEIN: CRP: 0.5 mg/dL (ref <1.0)

## 2020-08-04 MED ORDER — ASPIRIN 81 MG PO TBEC: 81.0000 mg | DELAYED_RELEASE_TABLET | Freq: Every day | ORAL | 11 refills | Status: DC

## 2020-08-04 NOTE — Plan of Care (Signed)
Pt understanding of plan of care for discharge

## 2020-08-04 NOTE — Discharge Instructions (Signed)

## 2020-08-04 NOTE — Care Management Obs Status (Signed)
MEDICARE OBSERVATION STATUS NOTIFICATION   Patient Details  Name: [name] [name] MRN: [id number] Date of Birth: 1940/07/12   Medicare Observation Status Notification Given:  Yes    Lawerance Sabal, RN 08/04/2020, 9:17 AM

## 2020-08-04 NOTE — Hospital Course (Signed)
79 year old home dwelling M Recent Covid 07/2020 recovered without hospitalization and outside window of infectivity Syncopal event 2021-Zio patch per Dr. 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 Clifton James recommended medical management   A1c 6.1 Platelet 147 BUNs/creatinine 19/1.5

## 2020-08-04 NOTE — Discharge Summary (Signed)
Physician Discharge Summary  [name] [name] ITG:[id number] DOB: 1941-04-07 DOA: 08/02/2020  PCP: Sigmund Hazel, MD  Admit date: 08/02/2020 Discharge date: 08/04/2020  Time spent: 40 minutes  Recommendations for Outpatient Follow-up:  1. Needs CBC Chem-12 in about 1 week 2. Follow-up with primary physician and talk to them about your A1c of 6.1-this may need to be addressed  3. also follow-up with primary care physician the possibility of a Covid induced pneumoniitis or fibrosis and pulmonology may need to see you in addition-he may need CT chest in the outpatient setting if it persists 4. Consider discontinuation of suppressive trimethoprim?  Unclear etiology defer to primary care  Discharge Diagnoses:  MAIN problem for hospitalization   Chest pain-- Angina ruled out this admission  Please see below for itemized issues addressed in HOpsital- refer to other progress notes for clarity if needed  Discharge Condition: Improved  Diet recommendation: Heart healthy  Filed Weights   08/02/20 2236 08/03/20 1022  Weight: 97.1 kg 93.2 kg    History of present illness:  80 year old home dwelling M Recent Covid 07/2020 recovered without hospitalization and outside window of infectivity Syncopal event 2021-Zio patch per Dr. 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 Clifton James recommended medical management on discharge  Patient was stabilized was doing well ambulating around the unit without issue He had no further chest pain  telemetry is benign for paced rhythm  I did not adjust meds but cardiology may decide to do the same after the same this morning He is stable from hospitalist perspective to discharge home  It appears he has recurrent urinary infections?  I am not sure-he will need to continue suppressive trimethoprim  He has an elevated A1c without diagnosis of diabetes mellitus but he was recently on steroids in the outpatient setting it appears-this will need to be addressed in the outpatient setting with primary care    Discharge Exam: Vitals:   08/03/20 2329 08/04/20 0429  BP: 129/78 (!) 146/81  Pulse: 62 61  Resp:  18  Temp: 97.9 F (36.6 C) 97.7 F (36.5 C)  SpO2: 96% 96%    Subj on day of d/c   Well awake coherent no distress eating and drinking Tells me he still has some chest pain-finds it difficult to define-he states that happens when he walks for unaccustomed distances in the weather-he states that this happens to be worse since he had Covid  General Exam on discharge    awake pleasant no distress EOMI NCAT no focal deficit  abdomen soft nontender no rebound no guarding Neck soft supple ROM intact No wheeze no rales no rhonchi No lower extremity edema Neurologically intact moving all 4 limbs equally  Discharge Instructions   Discharge Instructions    Diet - low sodium heart healthy   Complete by: As directed    Discharge instructions   Complete by: As directed    Please follow cardiologist's impressions and recommendations You will need in the outpatient setting follow-up with likely primary care to determine why you are having issues with chest pain-it may be because of exercise-induced asthma and  you could potentially take a puff of the inhaler before exercise to see if this helps?  Try that first and if not ask your primary care physician to see you  you are cleared for regular activity from my perspective  You will need to follow-up also with your primary care  physician because you have a slightly elevated A1c it looks like you are on steroids recently-also please ask them about the trimethoprim that you are on daily and if this needs to be continued   Increase activity slowly   Complete by: As directed      Allergies as of 08/04/2020      Reactions   Nsaids    REACTION: abnormal kidney fuction      Medication List    STOP taking these medications   predniSONE 10 MG tablet Commonly known as: DELTASONE     TAKE these medications   acetaminophen 325 MG tablet Commonly known as: TYLENOL Take 650 mg by mouth every 6 (six) hours as needed for moderate pain or headache.   aspirin 81 MG EC tablet Take 1 tablet (81 mg total) by mouth daily. Swallow whole. Start taking on: August 05, 2020   atorvastatin 10 MG tablet Commonly known as: LIPITOR Take 10 mg by mouth daily.   calcium carbonate 500 MG chewable tablet Commonly known as: TUMS - dosed in mg elemental calcium Chew 1 tablet by mouth 2 (two) times daily as needed for indigestion or heartburn.   Dulera 100-5 MCG/ACT Aero Generic drug: mometasone-formoterol TAKE 2 PUFFS FIRST THING IN THE MORNING AND THEN ANOTHER 2 PUFFS ABOUT 12 HOURS LATER What changed:   how much to take  how to take this  when to take this  additional instructions   losartan 100 MG tablet Commonly known as: COZAAR Take 100 mg by mouth daily.   trimethoprim 100 MG tablet Commonly known as: TRIMPEX Take 100 mg by mouth daily.      Allergies  Allergen Reactions  . Nsaids     REACTION: abnormal kidney fuction      The results of significant diagnostics from this hospitalization (including imaging, microbiology, ancillary and laboratory) are listed below for reference.    Significant Diagnostic Studies: DG Chest 2 View  Result Date: 08/02/2020 CLINICAL DATA:  Chest pain for 2-3 weeks EXAM: CHEST - 2 VIEW COMPARISON:  03/02/2020 FINDINGS: Cardiac shadow is within normal limits. Pacing device is  again seen and stable. The lungs are well aerated bilaterally. Mild atelectatic changes are noted posteriorly projecting in the right lower lobe. No effusion or pneumothorax is seen. No bony abnormality is noted. IMPRESSION: Mild right basilar atelectasis. Electronically Signed   By: Alcide CleverMark  Lukens M.D.   On: 08/02/2020 13:53   CARDIAC CATHETERIZATION  Result Date: 08/03/2020  Mid Cx lesion is 20% stenosed.  Mid LAD lesion is 20% stenosed.  Dist LAD lesion is 20% stenosed.  1. Mild non-obstructive CAD 2. Normal LV filling pressure 3. Non-cardiac chest pain Recommendations: Medical management of mild non-obstructive CAD   ECHOCARDIOGRAM COMPLETE  Result Date: 08/03/2020    ECHOCARDIOGRAM REPORT   Patient Name:   [name] [name] Date of Exam: 08/03/2020 Medical Rec #:  [id number]        Height:       72.0 in Accession #:    [id number]       Weight:       205.4 lb Date of Birth:  05/02/1941        BSA:  2.155 m Patient Age:    79 years         BP:           133/74 mmHg Patient Gender: M                HR:           60 bpm. Exam Location:  Inpatient Procedure: 2D Echo Indications:    Chest Pain R07.9  History:        Patient has prior history of Echocardiogram examinations, most                 recent 03/02/2020. Pacemaker; Risk Factors:Hypertension.  Sonographer:    Thurman Coyer RDCS (AE) Referring Phys: [id number] ANGELA NICOLE DUKE IMPRESSIONS  1. Left ventricular ejection fraction, by estimation, is 60 to 65%. The left ventricle has normal function. The left ventricle has no regional wall motion abnormalities. Left ventricular diastolic parameters are consistent with Grade I diastolic dysfunction (impaired relaxation).  2. Right ventricular systolic function is low normal. The right ventricular size is normal. Tricuspid regurgitation signal is inadequate for assessing PA pressure.  3. The mitral valve is grossly normal. No evidence of mitral valve regurgitation.  4. The aortic valve is tricuspid.  Aortic valve regurgitation is not visualized.  5. Aortic dilatation noted. There is borderline dilatation of the aortic root, measuring 38 mm. The ascending aorta is not well-visualized.  6. The inferior vena cava is normal in size with greater than 50% respiratory variability, suggesting right atrial pressure of 3 mmHg. Comparison(s): No significant change from prior study. 03/02/2020: LVEF 60-65%, dilated aorta to 42 mm. FINDINGS  Left Ventricle: Left ventricular ejection fraction, by estimation, is 60 to 65%. The left ventricle has normal function. The left ventricle has no regional wall motion abnormalities. The left ventricular internal cavity size was normal in size. There is  no left ventricular hypertrophy. Abnormal (paradoxical) septal motion, consistent with RV pacemaker. Left ventricular diastolic parameters are consistent with Grade I diastolic dysfunction (impaired relaxation). Indeterminate filling pressures. Right Ventricle: The right ventricular size is normal. No increase in right ventricular wall thickness. Right ventricular systolic function is low normal. Tricuspid regurgitation signal is inadequate for assessing PA pressure. Left Atrium: Left atrial size was normal in size. Right Atrium: Right atrial size was normal in size. Pericardium: There is no evidence of pericardial effusion. Mitral Valve: The mitral valve is grossly normal. No evidence of mitral valve regurgitation. Tricuspid Valve: The tricuspid valve is grossly normal. Tricuspid valve regurgitation is not demonstrated. Aortic Valve: The aortic valve is tricuspid. Aortic valve regurgitation is not visualized. Pulmonic Valve: The pulmonic valve was normal in structure. Pulmonic valve regurgitation is not visualized. Aorta: Aortic dilatation noted and the ascending aorta was not well visualized. There is borderline dilatation of the aortic root, measuring 38 mm. Venous: The inferior vena cava is normal in size with greater than 50%  respiratory variability, suggesting right atrial pressure of 3 mmHg. IAS/Shunts: No atrial level shunt detected by color flow Doppler. Additional Comments: A device lead is visualized.  LEFT VENTRICLE PLAX 2D LVIDd:         5.40 cm  Diastology LVIDs:         3.40 cm  LV e' medial:    5.96 cm/s LV PW:         1.00 cm  LV E/e' medial:  11.6 LV IVS:        0.90 cm  LV e' lateral:  6.92 cm/s LVOT diam:     2.20 cm  LV E/e' lateral: 10.0 LV SV:         100 LV SV Index:   46 LVOT Area:     3.80 cm  RIGHT VENTRICLE TAPSE (M-mode): 2.6 cm LEFT ATRIUM             Index       RIGHT ATRIUM           Index LA diam:        4.00 cm 1.86 cm/m  RA Area:     17.60 cm LA Vol (A2C):   40.4 ml 18.75 ml/m RA Volume:   46.90 ml  21.77 ml/m LA Vol (A4C):   68.6 ml 31.84 ml/m LA Biplane Vol: 54.6 ml 25.34 ml/m  AORTIC VALVE LVOT Vmax:   126.00 cm/s LVOT Vmean:  84.600 cm/s LVOT VTI:    0.263 m  AORTA Ao Root diam: 3.80 cm MITRAL VALVE MV Area (PHT): 2.32 cm    SHUNTS MV Decel Time: 327 msec    Systemic VTI:  0.26 m MV E velocity: 68.90 cm/s  Systemic Diam: 2.20 cm MV A velocity: 91.70 cm/s MV E/A ratio:  0.75 Zoila Shutter MD Electronically signed by Zoila Shutter MD Signature Date/Time: 08/03/2020/3:55:46 PM    Final     Microbiology: Recent Results (from the past 240 hour(s))  SARS CORONAVIRUS 2 (TAT 6-24 HRS) Nasopharyngeal Nasopharyngeal Swab     Status: Abnormal   Collection Time: 08/03/20 12:19 AM   Specimen: Nasopharyngeal Swab  Result Value Ref Range Status   SARS Coronavirus 2 POSITIVE (A) NEGATIVE Final    Comment: (NOTE) SARS-CoV-2 target nucleic acids are DETECTED.  The SARS-CoV-2 RNA is generally detectable in upper and lower respiratory specimens during the acute phase of infection. Positive results are indicative of the presence of SARS-CoV-2 RNA. Clinical correlation with patient history and other diagnostic information is  necessary to determine patient infection status. Positive results do not  rule out bacterial infection or co-infection with other viruses.  The expected result is Negative.  Fact Sheet for Patients: HairSlick.no  Fact Sheet for Healthcare Providers: quierodirigir.com  This test is not yet approved or cleared by the [place] FDA and  has been authorized for detection and/or diagnosis of SARS-CoV-2 by FDA under an Emergency Use Authorization (EUA). This EUA will remain  in effect (meaning this test can be used) for the duration of the COVID-19 declaration under Section 564(b)(1) of the Act, 21 U. S.C. section 360bbb-3(b)(1), unless the authorization is terminated or revoked sooner.   Performed at [facility], [address], [place], [place] [address]      Labs: Basic Metabolic Panel: Recent Labs  Lab 08/02/20 1340 08/03/20 0543 08/04/20 0226  NA 135 135 137  K 4.5 4.5 4.7  CL 102 103 106  CO2 GLUCOSE 88 99 107*  BUN CREATININE 1.39* 1.39* 1.54*  CALCIUM 8.9 8.7* 8.4*   Liver Function Tests: Recent Labs  Lab 08/04/20 0226  AST 20  ALT 19  ALKPHOS 53  BILITOT 0.9  PROT 5.9*  ALBUMIN 3.1*   No results for input(s): LIPASE, AMYLASE in the last 168 hours. No results for input(s): AMMONIA in the last 168 hours. CBC: Recent Labs  Lab 08/02/20 1340 08/03/20 0543 08/04/20 0226  WBC 10.3 8.7 7.6  NEUTROABS  --   --  3.5  HGB 14.8 14.0 13.6  HCT 47.2 42.4 40.9  MCV 89.7 86.7 87.2  PLT 200 174 147*   Cardiac Enzymes: No results for input(s): CKTOTAL, CKMB, CKMBINDEX, TROPONINI in the last 168 hours. BNP: BNP (last 3 results) No results for input(s): BNP in the last 8760 hours.  ProBNP (last 3 results) No results for input(s): PROBNP in the last 8760 hours.  CBG: No results for input(s): GLUCAP in the last 168 hours.     Signed:  Rhetta Mura MD   [facility] 08/04/2020, 9:11 AM

## 2020-08-06 ENCOUNTER — Encounter (HOSPITAL_COMMUNITY): Payer: Self-pay | Admitting: Cardiovascular Disease

## 2020-08-06 ENCOUNTER — Telehealth: Payer: Self-pay

## 2020-08-06 NOTE — Telephone Encounter (Signed)
-----   Message from Beatriz Stallion, PA-C sent at 08/04/2020 11:07 AM EST ----- Regarding: TOC call Hey there! Please place TOC call to patient. Post-hospital follow-up arranged with Dr. Flora Lipps 08/16/20 at 11am. Thanks!

## 2020-08-06 NOTE — Telephone Encounter (Signed)
Patient contacted regarding discharge from [facility] on 08/04/2020.  Patient understands to follow up with provider Dr.O'Neal on March 17 th at 11:00 AM at [facility]. Patient understands discharge instructions? Yes Patient understands medications and regiment? Yes Patient understands to bring all medications to this visit? Yes

## 2020-08-07 DIAGNOSIS — D72829 Elevated white blood cell count, unspecified: Secondary | ICD-10-CM | POA: Diagnosis not present

## 2020-08-07 DIAGNOSIS — R7303 Prediabetes: Secondary | ICD-10-CM | POA: Diagnosis not present

## 2020-08-15 NOTE — Progress Notes (Signed)
Cardiology Office Note:   Date:  08/16/2020  NAME:  [name] [name]    MRN: [id number] DOB:  02/22/1941   PCP:  Sigmund Hazel, MD  Cardiologist:  Reatha Harps, MD  Electrophysiologist:  Lanier Prude, MD   Referring MD: Sigmund Hazel, MD   Chief Complaint  Patient presents with  . Follow-up    Post hospital.   History of Present Illness:   [name] [name] is a 80 y.o. male with a hx of COPD, HTN, tobacco abuse, HLD, SSS s/p ppm, non-obstructive CAD. Seen in hospital 08/03/2020 for chest pain. LHC with non-obstructive CAD.  He is doing well since leaving the hospital.  Still having burning in his chest when he exerts himself.  He reports the cold weather makes this worse.  I suspect this is all COPD related.  He is not having obstructive CAD.  His most recent LDL cholesterol is at goal.  I recommend he continue on aspirin and statin.  He denies any alarming symptoms.  EKG in office demonstrates AV paced rhythm.  Problem List 1. COPD -mild obstructive disease 2. HTN 3. Tobacco abuse 4. Bladder dysfunction -self catheterization (4-5 years) 5. 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 HLD -Total cholesterol 123, HDL 62, LDL 49, triglycerides 54 7. Non-obstructive CAD 08/03/2020 -20% LAD -20% LCX  Past Medical History: Past Medical History:  Diagnosis Date  . Colon polyp March 2010   colonoscopy with Dr Randa Evens  . Elevated fasting glucose   . History of BPH    with obstruction and hydronephrosis and slihtly elevated creatinine, using self catherization, followed by urologist Dr McDiarmid  . History of ETT 2012   [name]  . History of tobacco use    54 pack yr  . HTN (hypertension)   . Paroxysmal supraventricular tachycardia (HCC)   . Screening for AAA (abdominal aortic aneurysm) 3/16   3cm, nl/early aneurysm, repeat 3 yrs with u/s 3/19    Past Surgical History: Past Surgical History:  Procedure Laterality Date  . APPENDECTOMY    . LEFT  HEART CATH AND CORONARY ANGIOGRAPHY N/A 08/03/2020   Procedure: LEFT HEART CATH AND CORONARY ANGIOGRAPHY;  Surgeon: Kathleene Hazel, MD;  Location: [facility] INVASIVE CV LAB;  Service: Cardiovascular;  Laterality: N/A;  . PACEMAKER IMPLANT N/A 03/02/2020   Procedure: PACEMAKER IMPLANT;  Surgeon: Lanier Prude, MD;  Location: [facility] INVASIVE CV LAB;  Service: Cardiovascular;  Laterality: N/A;  . REPLACEMENT TOTAL KNEE BILATERAL  [id number]  . TRANSURETHRAL RESECTION OF PROSTATE  2010    Current Medications: Current Meds  Medication Sig  . acetaminophen (TYLENOL) 325 MG tablet Take 650 mg by mouth every 6 (six) hours as needed for moderate pain or headache.  [name] aspirin EC 81 MG EC tablet Take 1 tablet (81 mg total) by mouth daily. Swallow whole.  [name] atorvastatin (LIPITOR) 10 MG tablet Take 10 mg by mouth daily.  . calcium carbonate (TUMS - DOSED IN MG ELEMENTAL CALCIUM) 500 MG chewable tablet Chew 1 tablet by mouth 2 (two) times daily as needed for indigestion or heartburn.  . losartan (COZAAR) 100 MG tablet Take 100 mg by mouth daily.  . mometasone-formoterol (DULERA) 100-5 MCG/ACT AERO TAKE 2 PUFFS FIRST THING IN THE MORNING AND THEN ANOTHER 2 PUFFS ABOUT 12 HOURS LATER (Patient taking differently: Inhale 2 puffs into the lungs in the morning and at bedtime.)  . trimethoprim (TRIMPEX) 100 MG tablet Take 100 mg by mouth daily.  Allergies:    Nsaids   Social History: Social History   Socioeconomic History  . Marital status: Married    Spouse name: Not on file  . Number of children: Not on file  . Years of education: Not on file  . Highest education level: Not on file  Occupational History  . Occupation: retired  Tobacco Use  . Smoking status: Former Smoker    Packs/day: 3.00    Years: 30.00    Pack years: 90.00    Types: Cigarettes    Quit date: 06/02/1985    Years since quitting: 35.2  . Smokeless tobacco: Never Used  Vaping Use  . Vaping Use: Never used  Substance and Sexual  Activity  . Alcohol use: Yes    Alcohol/week: 0.0 standard drinks    Comment: daily wine, occasional vodka  . Drug use: Never  . Sexual activity: Not on file  Other Topics Concern  . 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 There is no history of Colon cancer or Colon polyps.  ROS:   All other ROS reviewed and negative. Pertinent positives noted in the HPI.     EKGs/Labs/Other Studies Reviewed:   The following studies were personally reviewed by me today:  EKG:  EKG is ordered today.  The ekg ordered today demonstrates AV paced, and was personally reviewed by me.   TTE 08/03/2020 1. Left ventricular ejection fraction, by estimation, is 60 to 65%. The  left ventricle has normal function. The left ventricle has no regional  wall motion abnormalities. Left ventricular diastolic parameters are  consistent with Grade I diastolic  dysfunction (impaired relaxation).  2. Right ventricular systolic function is low normal. The right  ventricular size is normal. Tricuspid regurgitation signal is inadequate  for assessing PA pressure.  3. The mitral valve is grossly normal. No evidence of mitral valve  regurgitation.  4. The aortic valve is tricuspid. Aortic valve regurgitation is not  visualized.  5. Aortic dilatation noted. There is borderline dilatation of the aortic  root, measuring 38 mm. The ascending aorta is not well-visualized.  6. The inferior vena cava is normal in size with greater than 50%   respiratory variability, suggesting right atrial pressure of 3 mmHg.   LHC 08/03/2020   Mid Cx lesion is 20% stenosed.  Mid LAD lesion is 20% stenosed.  Dist LAD lesion is 20% stenosed.   1. Mild non-obstructive CAD 2. Normal LV filling pressure 3. Non-cardiac chest pain  Recommendations: Medical management of mild non-obstructive CAD   Recent Labs: 02/10/2020: TSH 3.030 08/04/2020: ALT 19; BUN 19; Creatinine, Ser 1.54; Hemoglobin 13.6; Platelets 147; Potassium 4.7; Sodium 137   Recent Lipid Panel    Component Value Date/Time   CHOL 106 08/03/2020 0853   TRIG 50 08/03/2020 0853   HDL 58 08/03/2020 0853   CHOLHDL 1.8 08/03/2020 0853   VLDL 10 08/03/2020 0853   LDLCALC 38 08/03/2020 0853  Physical Exam:   VS:  BP (!) 144/92 (BP Location: Left Arm, Patient Position: Sitting, Cuff Size: Normal)   Ht 6' (1.829 m)   Wt 215 lb (97.5 kg)   BMI 29.16 kg/m    Wt Readings from Last 3 Encounters:  08/16/20 215 lb (97.5 kg)  08/03/20 205 lb 6.4 oz (93.2 kg)  08/02/20 214 lb (97.1 kg)    General: Well nourished, well developed, in no acute distress Head: Atraumatic, normal size  Eyes: PEERLA, EOMI  Neck: Supple, no JVD Endocrine: No thryomegaly Cardiac: Normal S1, S2; RRR; no murmurs, rubs, or gallops Lungs: Clear to auscultation bilaterally, no wheezing, rhonchi or rales  Abd: Soft, nontender, no hepatomegaly  Ext: No edema, pulses 2+ Musculoskeletal: No deformities, BUE and BLE strength normal and equal Skin: Warm and dry, no rashes   Neuro: Alert and oriented to person, place, time, and situation, CNII-XII grossly intact, no focal deficits  Psych: Normal mood and affect   ASSESSMENT:   [name] [name] is a 80 y.o. male who presents for the following: 1. Coronary artery disease involving native coronary artery of native heart without angina pectoris   2. Mixed hyperlipidemia   3. Primary hypertension   4. Trifascicular block   5. Pacemaker     PLAN:   1.  Coronary artery disease involving native coronary artery of native heart without angina pectoris 2. Mixed hyperlipidemia -Admitted to the hospital with concerns for unstable angina.  Left heart catheterization showed minimal CAD.  His symptoms of burning in his chest when he exerts himself are related to COPD.  He will continue his aspirin and statin medication.  He will see me yearly.  3. Primary hypertension -Slightly elevated today.  Continue to monitor.  Continue home blood pressure medications.  For his age I think his level is acceptable.  4. Trifascicular block 5. Pacemaker -Initially seen by me for trifascicular block with syncope.  Found to have high-grade conduction disease and is status post pacemaker implantation.  No issues with this.  EKG shows his device is functioning normally.  Disposition: Return in about 1 year (around 08/16/2021).  Medication Adjustments/Labs and Tests Ordered: Current medicines are reviewed at length with the patient today.  Concerns regarding medicines are outlined above.  Orders Placed This Encounter  Procedures  . EKG 12-Lead   No orders of the defined types were placed in this encounter.   Patient Instructions  Medication Instructions:  No Changes In Medications at this time.  *If you need a refill on your cardiac medications before your next appointment, please call your pharmacy*  Follow-Up: At [facility], you and your health needs are our priority.  As part of our continuing mission to provide you with exceptional heart care, we have created designated Provider Care Teams.  These Care Teams include your primary Cardiologist (physician) and Advanced Practice Providers (APPs -  Physician Assistants and Nurse Practitioners) who all work together to provide you with the care you need, when you need it.  Your next appointment:   1 year(s)  The format for your next appointment:   In Person  Provider:   Lennie Odor, MD      Time  Spent with Patient: I have spent a total of 25 minutes with patient reviewing hospital notes, telemetry, EKGs, labs and examining the patient as well as establishing an assessment and plan that was discussed with the patient.  > 50% of time was spent in direct patient care.  Signed,  Gerri Spore T. Flora Lipps, MD, [facility]  [facility]  [address] [place], [place] [address] [phone]  08/16/2020 12:01 PM

## 2020-08-16 ENCOUNTER — Other Ambulatory Visit: Payer: Self-pay

## 2020-08-16 ENCOUNTER — Ambulatory Visit (INDEPENDENT_AMBULATORY_CARE_PROVIDER_SITE_OTHER): Payer: Medicare Other | Admitting: Cardiovascular Disease

## 2020-08-16 ENCOUNTER — Encounter: Payer: Self-pay | Admitting: Cardiovascular Disease

## 2020-08-16 VITALS — BP 144/92 | Ht 72.0 in | Wt 215.0 lb

## 2020-08-16 DIAGNOSIS — Z950 Presence of cardiac pacemaker: Secondary | ICD-10-CM

## 2020-08-16 DIAGNOSIS — I453 Trifascicular block: Secondary | ICD-10-CM | POA: Diagnosis not present

## 2020-08-16 DIAGNOSIS — E782 Mixed hyperlipidemia: Secondary | ICD-10-CM | POA: Diagnosis not present

## 2020-08-16 DIAGNOSIS — I2510 Atherosclerotic heart disease of native coronary artery without angina pectoris: Secondary | ICD-10-CM | POA: Diagnosis not present

## 2020-08-16 DIAGNOSIS — I10 Essential (primary) hypertension: Secondary | ICD-10-CM | POA: Diagnosis not present

## 2020-08-16 NOTE — Patient Instructions (Signed)
Medication Instructions:  No Changes In Medications at this time.  *If you need a refill on your cardiac medications before your next appointment, please call your pharmacy*  Follow-Up: At [facility], you and your health needs are our priority.  As part of our continuing mission to provide you with exceptional heart care, we have created designated Provider Care Teams.  These Care Teams include your primary Cardiologist (physician) and Advanced Practice Providers (APPs -  Physician Assistants and Nurse Practitioners) who all work together to provide you with the care you need, when you need it.  Your next appointment:   1 year(s)  The format for your next appointment:   In Person  Provider:   Hooppole O'Neal, MD    

## 2020-08-29 DIAGNOSIS — I2510 Atherosclerotic heart disease of native coronary artery without angina pectoris: Secondary | ICD-10-CM | POA: Diagnosis not present

## 2020-08-29 DIAGNOSIS — N400 Enlarged prostate without lower urinary tract symptoms: Secondary | ICD-10-CM | POA: Diagnosis not present

## 2020-08-29 DIAGNOSIS — J449 Chronic obstructive pulmonary disease, unspecified: Secondary | ICD-10-CM | POA: Diagnosis not present

## 2020-08-29 DIAGNOSIS — E7800 Pure hypercholesterolemia, unspecified: Secondary | ICD-10-CM | POA: Diagnosis not present

## 2020-08-29 DIAGNOSIS — N183 Chronic kidney disease, stage 3 unspecified: Secondary | ICD-10-CM | POA: Diagnosis not present

## 2020-08-29 DIAGNOSIS — I129 Hypertensive chronic kidney disease with stage 1 through stage 4 chronic kidney disease, or unspecified chronic kidney disease: Secondary | ICD-10-CM | POA: Diagnosis not present

## 2020-09-03 ENCOUNTER — Ambulatory Visit (INDEPENDENT_AMBULATORY_CARE_PROVIDER_SITE_OTHER): Payer: Medicare Other

## 2020-09-03 DIAGNOSIS — R55 Syncope and collapse: Secondary | ICD-10-CM | POA: Diagnosis not present

## 2020-09-04 LAB — CUP PACEART REMOTE DEVICE CHECK
Battery Remaining Longevity: 135 mo
Battery Voltage: 3.11 V
Brady Statistic AP VP Percent: 88.93 %
Brady Statistic AP VS Percent: 0.03 %
Brady Statistic AS VP Percent: 10.99 %
Brady Statistic AS VS Percent: 0.05 %
Brady Statistic RA Percent Paced: 88.94 %
Brady Statistic RV Percent Paced: 99.93 %
Date Time Interrogation Session: 20220403201558
Implantable Lead Implant Date: 20211001
Implantable Lead Implant Date: 20211001
Implantable Lead Location: 753859
Implantable Lead Location: 753860
Implantable Lead Model: 5076
Implantable Lead Model: 5076
Implantable Pulse Generator Implant Date: 20211001
Lead Channel Impedance Value: 323 Ohm
Lead Channel Impedance Value: 418 Ohm
Lead Channel Impedance Value: 475 Ohm
Lead Channel Impedance Value: 475 Ohm
Lead Channel Pacing Threshold Amplitude: 0.5 V
Lead Channel Pacing Threshold Amplitude: 0.875 V
Lead Channel Pacing Threshold Pulse Width: 0.4 ms
Lead Channel Pacing Threshold Pulse Width: 0.4 ms
Lead Channel Sensing Intrinsic Amplitude: 0.625 mV
Lead Channel Sensing Intrinsic Amplitude: 0.625 mV
Lead Channel Sensing Intrinsic Amplitude: 2.25 mV
Lead Channel Sensing Intrinsic Amplitude: 2.25 mV
Lead Channel Setting Pacing Amplitude: 1.75 V
Lead Channel Setting Pacing Amplitude: 2 V
Lead Channel Setting Pacing Pulse Width: 0.4 ms
Lead Channel Setting Sensing Sensitivity: 0.9 mV

## 2020-09-12 ENCOUNTER — Other Ambulatory Visit: Payer: Self-pay | Admitting: Pulmonary Disease

## 2020-09-12 DIAGNOSIS — J449 Chronic obstructive pulmonary disease, unspecified: Secondary | ICD-10-CM

## 2020-09-13 NOTE — Progress Notes (Signed)
Remote pacemaker transmission.   

## 2020-09-18 ENCOUNTER — Other Ambulatory Visit (HOSPITAL_COMMUNITY): Payer: Self-pay | Admitting: Orthopedic Surgery

## 2020-09-18 ENCOUNTER — Other Ambulatory Visit: Payer: Self-pay | Admitting: Orthopedic Surgery

## 2020-09-18 DIAGNOSIS — Z96652 Presence of left artificial knee joint: Secondary | ICD-10-CM | POA: Diagnosis not present

## 2020-09-18 DIAGNOSIS — M79662 Pain in left lower leg: Secondary | ICD-10-CM | POA: Diagnosis not present

## 2020-09-18 DIAGNOSIS — M25562 Pain in left knee: Secondary | ICD-10-CM | POA: Diagnosis not present

## 2020-09-18 DIAGNOSIS — Z471 Aftercare following joint replacement surgery: Secondary | ICD-10-CM | POA: Diagnosis not present

## 2020-09-24 ENCOUNTER — Encounter (HOSPITAL_COMMUNITY)
Admission: RE | Admit: 2020-09-24 | Discharge: 2020-09-24 | Disposition: A | Discharge status: Home / Self Care | Payer: Medicare Other | Source: Ambulatory Visit | Attending: Orthopedic Surgery | Admitting: Orthopedic Surgery

## 2020-09-24 ENCOUNTER — Encounter (HOSPITAL_COMMUNITY): Payer: Self-pay

## 2020-09-24 DIAGNOSIS — M25562 Pain in left knee: Secondary | ICD-10-CM | POA: Diagnosis not present

## 2020-09-24 DIAGNOSIS — Z96652 Presence of left artificial knee joint: Secondary | ICD-10-CM | POA: Diagnosis not present

## 2020-09-24 DIAGNOSIS — Z96653 Presence of artificial knee joint, bilateral: Secondary | ICD-10-CM | POA: Diagnosis not present

## 2020-09-24 MED ORDER — TECHNETIUM TC 99M MEDRONATE IV KIT
20.0000 | PACK | Freq: Once | INTRAVENOUS | Status: AC | PRN
  Administered 2020-09-24: 19.7 via INTRAVENOUS

## 2020-10-02 ENCOUNTER — Other Ambulatory Visit: Payer: Self-pay | Admitting: Orthopedic Surgery

## 2020-10-02 DIAGNOSIS — Z01818 Encounter for other preprocedural examination: Secondary | ICD-10-CM

## 2020-10-02 DIAGNOSIS — Z96652 Presence of left artificial knee joint: Secondary | ICD-10-CM | POA: Diagnosis not present

## 2020-10-02 DIAGNOSIS — M25562 Pain in left knee: Secondary | ICD-10-CM | POA: Diagnosis not present

## 2020-10-04 ENCOUNTER — Telehealth: Payer: Self-pay | Admitting: Cardiovascular Disease

## 2020-10-04 NOTE — Telephone Encounter (Signed)
    [name] DOB:  July 02, 1940  MRN:  [id number]   Primary Cardiologist: Reatha Harps, MD  Chart reviewed as part of pre-operative protocol coverage. Patient was last seen by Dr. Flora Lipps in 07/2020. Patient was contacted today for further pre-op evaluation and reports doing well since his last visit. He denies any recent chest pain. He has chronic dyspnea due to his COPD but states this is stable. No orthopnea, PND, or significant lower extremity edema. No palpitations or near syncope/syncope. Recent cardiac cath in 07/2020 showed only minimal CAD. Echo at that time showed normal LV function. Given past medical history and time since last visit, based on ACC/AHA guidelines, [name] [name] would be at acceptable risk for the planned procedure without further cardiovascular testing.   I will route this recommendation to the requesting party via Epic fax function and remove from pre-op pool.  Please call with questions.  [name] Parker, PA-C 10/04/2020, 10:26 AM

## 2020-10-04 NOTE — Telephone Encounter (Signed)
   [facility] Pre-operative Risk Assessment    Patient Name: [name] [name]  DOB: [birth year]  MRN: [id number]   HEARTCARE STAFF: - Please ensure there is not already an duplicate clearance open for this procedure. - Under Visit Info/Reason for Call, type in Other and utilize the format Clearance MM/DD/YY or Clearance TBD. Do not use dashes or single digits. - If request is for dental extraction, please clarify the # of teeth to be extracted.  Request for surgical clearance:  1. What type of surgery is being performed? Revision of the left knee replacement   2. When is this surgery scheduled? 10-15-20   3. What type of clearance is required (medical clearance vs. Pharmacy clearance to hold med vs. Both)? both  4. Are there any medications that need to be held prior to surgery and how long?  5. Practice name and name of physician performing surgery?  Dr Frederik Pear  6. What is the office phone number? [phone]   7.   What is the office fax number? [phone]  8.   Anesthesia type (None, local, MAC, general) ? Spinal   Glyn Ade 10/04/2020, 10:15 AM  _________________________________________________________________   (provider comments below)

## 2020-10-09 NOTE — Progress Notes (Signed)
DUE TO COVID-19 ONLY ONE VISITOR IS ALLOWED TO COME WITH YOU AND STAY IN THE WAITING ROOM ONLY DURING PRE OP AND PROCEDURE DAY OF SURGERY. THE 1 VISITOR  MAY VISIT WITH YOU AFTER SURGERY IN YOUR PRIVATE ROOM DURING VISITING HOURS ONLY!  YOU NEED TO HAVE A COVID 19 TEST ON__5/05/2021 _____ @_______ , THIS TEST MUST BE DONE BEFORE SURGERY,  COVID TESTING SITE [address] [place]  , IT IS ON THE RIGHT GOING OUT [address] APPROXIMATELY  2 MINUTES PAST [employer] ON THE RIGHT. ONCE YOUR COVID TEST IS COMPLETED,  PLEASE BEGIN THE QUARANTINE INSTRUCTIONS AS OUTLINED IN YOUR HANDOUT.                [name] [name]  10/09/2020   Your procedure is scheduled on:                  10/15/2020   Report to [facility] Main  Entrance   Report to admitting at   0515 AM     Call this number if you have problems the morning of surgery [phone]    REMEMBER: NO  SOLID FOOD CANDY OR GUM AFTER MIDNIGHT. CLEAR LIQUIDS UNTIL  0415am         . NOTHING BY MOUTH EXCEPT CLEAR LIQUIDS UNTIL     0415am    . PLEASE FINISH ENSURE DRINK PER SURGEON ORDER  WHICH NEEDS TO BE COMPLETED AT   0415am    .      CLEAR LIQUID DIET   Foods Allowed                                                                    Coffee and tea, regular and decaf                            Fruit ices (not with fruit pulp)                                      Iced Popsicles                                    Carbonated beverages, regular and diet                                    Cranberry, grape and apple juices Sports drinks like Gatorade Lightly seasoned clear broth or consume(fat free) Sugar, honey syrup ___________________________________________________________________      BRUSH YOUR TEETH MORNING OF SURGERY AND RINSE YOUR MOUTH OUT, NO CHEWING GUM CANDY OR MINTS.     Take these medicines the morning of surgery with A SIP OF WATER:         None  DO NOT TAKE ANY DIABETIC  MEDICATIONS DAY OF YOUR SURGERY                               You may  not have any metal on your body including hair pins and              piercings  Do not wear jewelry, make-up, lotions, powders or perfumes, deodorant             Do not wear nail polish on your fingernails.  Do not shave  48 hours prior to surgery.              Men may shave face and neck.   Do not bring valuables to the hospital. [place].  Contacts, dentures or bridgework may not be worn into surgery.  Leave suitcase in the car. After surgery it may be brought to your room.     Patients discharged the day of surgery will not be allowed to drive home. IF YOU ARE HAVING SURGERY AND GOING HOME THE SAME DAY, YOU MUST HAVE AN ADULT TO DRIVE YOU HOME AND BE WITH YOU FOR 24 HOURS. YOU MAY GO HOME BY TAXI OR UBER OR ORTHERWISE, BUT AN ADULT MUST ACCOMPANY YOU HOME AND STAY WITH YOU FOR 24 HOURS.  Name and phone number of your driver:  Special Instructions: N/A              Please read over the following fact sheets you were given: _____________________________________________________________________  [facility] - Preparing for Surgery Before surgery, you can play an important role.  Because skin is not sterile, your skin needs to be as free of germs as possible.  You can reduce the number of germs on your skin by washing with CHG (chlorahexidine gluconate) soap before surgery.  CHG is an antiseptic cleaner which kills germs and bonds with the skin to continue killing germs even after washing. Please DO NOT use if you have an allergy to CHG or antibacterial soaps.  If your skin becomes reddened/irritated stop using the CHG and inform your nurse when you arrive at Short Stay. Do not shave (including legs and underarms) for at least 48 hours prior to the first CHG shower.  You may shave your face/neck. Please follow these instructions carefully:  1.  Shower with CHG Soap the night  before surgery and the  morning of Surgery.  2.  If you choose to wash your hair, wash your hair first as usual with your  normal  shampoo.  3.  After you shampoo, rinse your hair and body thoroughly to remove the  shampoo.                           4.  Use CHG as you would any other liquid soap.  You can apply chg directly  to the skin and wash                       Gently with a scrungie or clean washcloth.  5.  Apply the CHG Soap to your body ONLY FROM THE NECK DOWN.   Do not use on face/ open                           Wound or open sores. Avoid contact with eyes, ears mouth and genitals (private parts).  Wash face,  Genitals (private parts) with your normal soap.             6.  Wash thoroughly, paying special attention to the area where your surgery  will be performed.  7.  Thoroughly rinse your body with warm water from the neck down.  8.  DO NOT shower/wash with your normal soap after using and rinsing off  the CHG Soap.                9.  Pat yourself dry with a clean towel.            10.  Wear clean pajamas.            11.  Place clean sheets on your bed the night of your first shower and do not  sleep with pets. Day of Surgery : Do not apply any lotions/deodorants the morning of surgery.  Please wear clean clothes to the hospital/surgery center.  FAILURE TO FOLLOW THESE INSTRUCTIONS MAY RESULT IN THE CANCELLATION OF YOUR SURGERY PATIENT SIGNATURE_________________________________  NURSE SIGNATURE__________________________________  ________________________________________________________________________

## 2020-10-11 ENCOUNTER — Ambulatory Visit (HOSPITAL_COMMUNITY)
Admission: RE | Admit: 2020-10-11 | Discharge: 2020-10-11 | Disposition: A | Discharge status: Home / Self Care | Payer: Medicare Other | Source: Ambulatory Visit | Attending: Orthopedic Surgery | Admitting: Orthopedic Surgery

## 2020-10-11 ENCOUNTER — Encounter (HOSPITAL_COMMUNITY)
Admission: RE | Admit: 2020-10-11 | Discharge: 2020-10-11 | Disposition: A | Discharge status: Home / Self Care | Payer: Medicare Other | Source: Ambulatory Visit | Attending: Orthopedic Surgery | Admitting: Orthopedic Surgery

## 2020-10-11 ENCOUNTER — Encounter: Payer: Self-pay | Admitting: Primary Care

## 2020-10-11 ENCOUNTER — Ambulatory Visit (INDEPENDENT_AMBULATORY_CARE_PROVIDER_SITE_OTHER): Payer: Medicare Other | Admitting: Primary Care

## 2020-10-11 ENCOUNTER — Other Ambulatory Visit (HOSPITAL_COMMUNITY)
Admission: RE | Admit: 2020-10-11 | Discharge: 2020-10-11 | Disposition: A | Discharge status: Home / Self Care | Payer: Medicare Other | Source: Ambulatory Visit | Attending: Orthopedic Surgery | Admitting: Orthopedic Surgery

## 2020-10-11 ENCOUNTER — Encounter (HOSPITAL_COMMUNITY): Payer: Self-pay

## 2020-10-11 ENCOUNTER — Other Ambulatory Visit: Payer: Self-pay

## 2020-10-11 DIAGNOSIS — N189 Chronic kidney disease, unspecified: Secondary | ICD-10-CM | POA: Insufficient documentation

## 2020-10-11 DIAGNOSIS — Z01818 Encounter for other preprocedural examination: Secondary | ICD-10-CM | POA: Diagnosis not present

## 2020-10-11 DIAGNOSIS — Z01812 Encounter for preprocedural laboratory examination: Secondary | ICD-10-CM | POA: Insufficient documentation

## 2020-10-11 DIAGNOSIS — Z20822 Contact with and (suspected) exposure to covid-19: Secondary | ICD-10-CM | POA: Insufficient documentation

## 2020-10-11 DIAGNOSIS — Z87891 Personal history of nicotine dependence: Secondary | ICD-10-CM | POA: Insufficient documentation

## 2020-10-11 DIAGNOSIS — Z79899 Other long term (current) drug therapy: Secondary | ICD-10-CM | POA: Insufficient documentation

## 2020-10-11 DIAGNOSIS — Z950 Presence of cardiac pacemaker: Secondary | ICD-10-CM | POA: Insufficient documentation

## 2020-10-11 DIAGNOSIS — J449 Chronic obstructive pulmonary disease, unspecified: Secondary | ICD-10-CM | POA: Diagnosis not present

## 2020-10-11 DIAGNOSIS — Z7982 Long term (current) use of aspirin: Secondary | ICD-10-CM | POA: Diagnosis not present

## 2020-10-11 DIAGNOSIS — Z01811 Encounter for preprocedural respiratory examination: Secondary | ICD-10-CM | POA: Diagnosis not present

## 2020-10-11 DIAGNOSIS — I129 Hypertensive chronic kidney disease with stage 1 through stage 4 chronic kidney disease, or unspecified chronic kidney disease: Secondary | ICD-10-CM | POA: Insufficient documentation

## 2020-10-11 HISTORY — DX: Inflammatory liver disease, unspecified: K75.9

## 2020-10-11 HISTORY — DX: Chronic obstructive pulmonary disease, unspecified: J44.9

## 2020-10-11 HISTORY — DX: Presence of cardiac pacemaker: Z95.0

## 2020-10-11 HISTORY — DX: Unspecified osteoarthritis, unspecified site: M19.90

## 2020-10-11 HISTORY — DX: Chronic kidney disease, unspecified: N18.9

## 2020-10-11 LAB — CBC WITH DIFFERENTIAL/PLATELET
Abs Immature Granulocytes: 0.02 10*3/uL (ref 0.00–0.07)
Basophils Absolute: 0 10*3/uL (ref 0.0–0.1)
Basophils Relative: 1 %
Eosinophils Absolute: 0.2 10*3/uL (ref 0.0–0.5)
Eosinophils Relative: 3 %
HCT: 42.5 % (ref 39.0–52.0)
Hemoglobin: 14.2 g/dL (ref 13.0–17.0)
Immature Granulocytes: 0 %
Lymphocytes Relative: 48 %
Lymphs Abs: 4 10*3/uL (ref 0.7–4.0)
MCH: 29.8 pg (ref 26.0–34.0)
MCHC: 33.4 g/dL (ref 30.0–36.0)
MCV: 89.1 fL (ref 80.0–100.0)
Monocytes Absolute: 0.7 10*3/uL (ref 0.1–1.0)
Monocytes Relative: 8 %
Neutro Abs: 3.3 10*3/uL (ref 1.7–7.7)
Neutrophils Relative %: 40 %
Platelets: 172 10*3/uL (ref 150–400)
RBC: 4.77 MIL/uL (ref 4.22–5.81)
RDW: 13.5 % (ref 11.5–15.5)
WBC: 8.2 10*3/uL (ref 4.0–10.5)
nRBC: 0 % (ref 0.0–0.2)

## 2020-10-11 LAB — BASIC METABOLIC PANEL
Anion gap: 3 — ABNORMAL LOW (ref 5–15)
BUN: 22 mg/dL (ref 8–23)
CO2: 27 mmol/L (ref 22–32)
Calcium: 8.5 mg/dL — ABNORMAL LOW (ref 8.9–10.3)
Chloride: 105 mmol/L (ref 98–111)
Creatinine, Ser: 2.02 mg/dL — ABNORMAL HIGH (ref 0.61–1.24)
GFR, Estimated: 33 mL/min — ABNORMAL LOW (ref >60)
Glucose, Bld: 106 mg/dL — ABNORMAL HIGH (ref 70–99)
Potassium: 5.3 mmol/L — ABNORMAL HIGH (ref 3.5–5.1)
Sodium: 135 mmol/L (ref 135–145)

## 2020-10-11 LAB — URINALYSIS, ROUTINE W REFLEX MICROSCOPIC
Bacteria, UA: NONE SEEN
Bilirubin Urine: NEGATIVE
Glucose, UA: NEGATIVE mg/dL
Ketones, ur: NEGATIVE mg/dL
Leukocytes,Ua: NEGATIVE
Nitrite: NEGATIVE
Protein, ur: NEGATIVE mg/dL
Specific Gravity, Urine: 1.01 (ref 1.005–1.030)
pH: 6 (ref 5.0–8.0)

## 2020-10-11 LAB — APTT: aPTT: 34 seconds (ref 24–36)

## 2020-10-11 LAB — PROTIME-INR
INR: 1 (ref 0.8–1.2)
Prothrombin Time: 13 seconds (ref 11.4–15.2)

## 2020-10-11 LAB — SURGICAL PCR SCREEN
MRSA, PCR: NEGATIVE
Staphylococcus aureus: NEGATIVE

## 2020-10-11 LAB — SARS CORONAVIRUS 2 (TAT 6-24 HRS): SARS Coronavirus 2: NEGATIVE

## 2020-10-11 NOTE — Progress Notes (Addendum)
Anesthesia Review:  PCP: DR Sigmund Hazel  Cardiologist : DR Lennie Odor 08/16/20 LOV  Clearance in 10/04/20- telephone encounter  Pacemaker 03/21/2020 09/14/2020 last device check  Pulmonary- Dr Lynnell Grain on [address]   LOV with Ames Dura- 10/11/20  Recommendatoinsi for surgery listed in note of 10/11/2020.   Chest x-ray :10/11/20 EKG : 08/16/20  Echo : 08/03/20  Stress test: Cardiac Cath : 08/03/20  Activity level: can do a flight of stairs without difficulty  Sleep Study/ CPAP : no  Fasting Blood Sugar :      / Checks Blood Sugar -- times a day:   Blood Thinner/ Instructions /Last Dose: ASA / Instructions/ Last Dose :  PT does self caths at home.   Pt reports being in hospital on 08/03/20 for suspected Mi.  PEr pt and wife it turns outs ekgs were read wrong.   81 mg Aspirin  BM"P done 10/11/20 routed to Dr Turner Daniels.

## 2020-10-11 NOTE — Assessment & Plan Note (Addendum)
-   Stable interval; Continue Dulera 100 two puffs twice daily

## 2020-10-11 NOTE — Progress Notes (Signed)
@Patient  ID: [name] [name], male    DOB: 02/23/1941, 80 y.o.   MRN: [id number]  Chief Complaint  Patient presents with  . Follow-up    Breathing stable, knee surgery on 10/15/20    Referring provider: Sigmund HazelMiller, Lisa, MD  HPI: 80-year-old male, former smoker quit 1997.  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 Patient of Dr. Sherene SiresWert, last seen by pulmonary NP on 08/02/20.  Previous [facility] pulmonary encounter: 08/02/2020 Follow up : COPD  Patient presents for a 309-month follow-up.  Patient has underlying mild COPD.  He is maintained on Dulera twice daily. Says was doing well until 4 weeks ago when he got Covid 19 infection.  Reports that he is fully vaccinated for COVID-19.  Had a mild infection in January but has had a lingering cough since then.  Also over the last 3 weeks has had exertional chest burning.  Worse with activities.  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  And underwent pacemaker implantation.  Patient says he has had no further syncopal episodes.  2D echo at that time showed a preserved EF.  Patient denies any radiating pains but describes a burning sensation along the mid chest when he walks.  Patient denies any jaw pain.  Denies any calf pain, hemoptysis, fever or discolored mucus.  Does have a lingering dry cough. Patient is fully vaccinated for COVID-19.  Prevnar and Pneumovax are up-to-date. EKG today shows Diffuse ST elevation . R BBB HR 63.  O2 saturations on room air are 95%.  10/11/2020 - Interim hx  Patient presents today for surgical risk assessment. Accompanied by his wife. He is having revision left knee replacement with Dr. Turner Danielsowan on Monday 10/15/20. He is doing well. He has no acute complaints or recent respiratory infections. He is independent and is not on oxygen. He is compliant with Dulera 100 two puffs twice daily. Denies f/c/s, shortness of breath, chest  tightness, wheezing or cough.    TEST/EVENTS :  PFTs  05/23/15 FVC 3.59 (78%), FEV1 1.08 (30%), F/F 38 Severe obstructive lung disease.  08/11/2017 FVC 4.72 [105%], FEV1 2.92 [90%], F/F 62, TLC 142%, DLCO 70% Mild obstructive airway disease with air trapping, hyperinflation, reversibility.  12/01/2019 FVC 4.74 [107%], FEV1 3.03 [94%], F/F 64, TLC 9.46 [126%), DLCO 23.57 [90%] Mild obstructive airway disease with air trapping and hyperinflation. Mildly improved compared to 2019  Labs A1AT levels 12/20/15- 109, PIMM phenotype CBC 08/11/2017-WBC 10.7, eos 1.7%, absolute eosinophil count 182 Blood allergy profile 08/11/2017-IgE 154, RAST panel negative  Imaging Chest x-ray 07/03/16- hyperinflated lung, mild left base atelectasis/scarring Chest x-ray 07/13/2017-hyperinflated lungs CT abdomen pelvis 07/24/2017-mild scarring in the bases. No evidence of interstitial lung disease. Chest x-ray 10/10/2019-no acute cardiopulmonary disease  Allergies  Allergen Reactions  . Nsaids     REACTION: abnormal kidney fuction    Immunization History  Administered Date(s) Administered  . Fluad Quad(high Dose 65+) 02/01/2020  . Influenza Split 05/23/2015  . Influenza, High Dose Seasonal PF 05/23/2015, 03/06/2017, 04/13/2017  . Influenza-Unspecified 03/16/2013  . PFIZER(Purple Top)SARS-COV-2 Vaccination 06/14/2019, 07/04/2019, 02/01/2020  . Pneumococcal Conjugate-13 08/19/2013  . Pneumococcal Polysaccharide-23 04/21/2007, 06/06/2007, 06/02/2013  . Td 03/27/2009  . Tdap 03/06/2017  . Zoster 04/04/2008    Past Medical History:  Diagnosis Date  . Colon polyp March 2010   colonoscopy with Dr Randa EvensEdwards  . Elevated fasting glucose   . 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 History of ETT 2012   [name]  . History of tobacco use    54 pack yr  . HTN (hypertension)   . Paroxysmal  supraventricular tachycardia (HCC)   . Screening for AAA (abdominal aortic aneurysm) 3/16   3cm, nl/early aneurysm, repeat 3 yrs with u/s 3/19    Tobacco History: Social History   Tobacco Use  Smoking Status Former Smoker  . Packs/day: 3.00  . Years: 30.00  . Pack years: 90.00  . Types: Cigarettes  . Quit date: 06/02/1985  . Years since quitting: 35.3  Smokeless Tobacco Never Used   Counseling given: Yes   Outpatient Medications Prior to Visit  Medication Sig Dispense Refill  . acetaminophen (TYLENOL) 325 MG tablet Take 650 mg by mouth every 6 (six) hours as needed for moderate pain or headache.    [name] aspirin EC 81 MG EC tablet Take 1 tablet (81 mg total) by mouth daily. Swallow whole. 30 tablet 11  . atorvastatin (LIPITOR) 10 MG tablet Take 10 mg by mouth daily.    . calcium carbonate (TUMS - DOSED IN MG ELEMENTAL CALCIUM) 500 MG chewable tablet Chew 1 tablet by mouth 2 (two) times daily as needed for indigestion or heartburn.    . losartan (COZAAR) 100 MG tablet Take 100 mg by mouth daily.    . mometasone-formoterol (DULERA) 100-5 MCG/ACT AERO Inhale 2 puffs into the lungs in the morning and at bedtime. 13 g 5  . trimethoprim (TRIMPEX) 100 MG tablet Take 100 mg by mouth daily.     No facility-administered medications prior to visit.   Review of Systems  Review of Systems  Constitutional: Negative.   HENT: Negative.   Respiratory: Negative.   Cardiovascular: Negative.   Musculoskeletal:       Knee pain    Physical Exam  BP 118/74 (BP Location: Left Arm, Cuff Size: Normal)   Pulse 73   Temp 97.6 F (36.4 C) (Oral)   Ht 6' (1.829 m)   Wt 214 lb 12.8 oz (97.4 kg)   SpO2 98%   BMI 29.13 kg/m  Physical Exam Constitutional:      Appearance: Normal appearance.  HENT:     Head: Normocephalic and atraumatic.     Mouth/Throat:     Mouth: Mucous membranes are moist.     Pharynx: Oropharynx is clear.     Comments: Mallampati class I Cardiovascular:     Rate and  Rhythm: Normal rate and regular rhythm.  Pulmonary:     Effort: Pulmonary effort is normal.     Breath sounds: Normal breath sounds.     Comments: CTA Musculoskeletal:     Comments: Left knee swollen   Skin:    General: Skin is warm and dry.  Neurological:     General: No focal deficit present.     Mental Status: He is alert and oriented to person, place, and time. Mental status is at baseline.  Psychiatric:        Mood and Affect: Mood normal.        Behavior: Behavior normal.        Thought Content: Thought content normal.        Judgment: Judgment normal.      Lab Results:  CBC    Component Value Date/Time   WBC 7.6 08/04/2020 0226   RBC 4.69 08/04/2020 0226   HGB 13.6 08/04/2020  0226   HCT 40.9 08/04/2020 0226   PLT 147 (L) 08/04/2020 0226   MCV 87.2 08/04/2020 0226   MCH 29.0 08/04/2020 0226   MCHC 33.3 08/04/2020 0226   RDW 13.4 08/04/2020 0226   LYMPHSABS 3.3 08/04/2020 0226   MONOABS 0.7 08/04/2020 0226   EOSABS 0.2 08/04/2020 0226   BASOSABS 0.0 08/04/2020 0226    BMET    Component Value Date/Time   NA 137 08/04/2020 0226   K 4.7 08/04/2020 0226   CL 106 08/04/2020 0226   CO2 25 08/04/2020 0226   GLUCOSE 107 (H) 08/04/2020 0226   BUN 19 08/04/2020 0226   CREATININE 1.54 (H) 08/04/2020 0226   CALCIUM 8.4 (L) 08/04/2020 0226   GFRNONAA 45 (L) 08/04/2020 0226   GFRAA >60 02/03/2020 0547    BNP No results found for: BNP  ProBNP No results found for: PROBNP  Imaging: NM Bone Scan 3 Phase  Result Date: 09/24/2020 CLINICAL DATA:  Left knee pain for 3 weeks. History of bilateral total knee arthroplasties. EXAM: NUCLEAR MEDICINE 3-PHASE BONE SCAN TECHNIQUE: Radionuclide angiographic images, immediate static blood pool images, and 3-hour delayed static images were obtained of the bilateral knees after intravenous injection of radiopharmaceutical. RADIOPHARMACEUTICALS:  19.7 mCi Tc-5m MDP IV COMPARISON:  None. FINDINGS: Vascular phase: Asymmetric  increased flow to the left knee compared to the right. Blood pool phase: Asymmetric uptake around the left knee. Delayed phase: Symmetric uptake around both knee prostheses, likely normal. No focal areas of abnormal uptake to suggest stress fracture or loosening. IMPRESSION: Bone scan findings are most likely due to synovitis involving the left knee. I do not see any findings suspicious for fracture, loosening or infection. Electronically Signed   By: Rudie Meyer M.D.   On: 09/24/2020 15:42     Assessment & Plan:   COPD, group B, by GOLD 2017 classification (HCC) - Stable interval; Continue Dulera 100 two puffs twice daily   Pre-operative respiratory examination - Patient has mild obstruction on PFTs. He is well controlled on low dose ICS/LABA. No recent respiratory infections. He is not on oxygen. He is considered low risk for prolonged mech ventilator and/or post-op pulmonary complications.   Major Pulmonary risks identified in the multifactorial risk analysis are but not limited to a) pneumonia; b) recurrent intubation risk; c) prolonged or recurrent acute respiratory failure needing mechanical ventilation; d) prolonged hospitalization; e) DVT/Pulmonary embolism; f) Acute Pulmonary edema  Recommend 1. Short duration of surgery as much as possible and avoid paralytic if possible 2. Recovery in step down or ICU with Pulmonary consultation if needed  3. DVT prophylaxis 4. Aggressive pulmonary toilet with o2, bronchodilatation, and incentive spirometry and early ambulation      1) RISK FOR PROLONGED MECHANICAL VENTILAION - > 48h  1A) Arozullah - Prolonged mech ventilation risk Arozullah Postperative Pulmonary Risk Score - for mech ventilation dependence >48h USAA, Ann Surg 2000, major non-cardiac surgery) Comment Score  Type of surgery - abd ao aneurysm (27), thoracic (21), neurosurgery / upper abdominal / vascular (21), neck (11) Left knee surgery  6  Emergency Surgery -  (11)  0  ALbumin < 3 or poor nutritional state - (9)  0  BUN > 30 -  (8)  0  Partial or completely dependent functional status - (7)  0  COPD -  (6)  6  Age - 60 to 69 (4), > 70  (6)  6  TOTAL  18  Risk Stratifcation scores  - <  10 (0.5%), 11-19 (1.8%), 20-27 (4.2%), 28-40 (10.1%), >40 (26.6%)  1.8% risk prolonged mech ventilation       1B) GUPTA - Prolonged Mech Vent Risk Score source Risk  Guptal post op prolonged mech ventilation > 48h or reintubation < 30 days - ACS 2007-2008 dataset - [email or web address] 0.3 % Risk of mechanical ventilation for >48 hrs after surgery, or unplanned intubation ?30 days of surgery    2) RISK FOR POST OP PNEUMONIA Score source Risk  Chales Abrahams - Post Op Pnemounia risk  [employer] 0.6 % Risk of postoperative pneumonia    R3) ISK FOR ANY POST-OP PULMONARY COMPLICATION Score source Risk  CANET/ARISCAT Score - risk for ANY/ALl pulmonary complications - > risk of in-hospital post-op pulmonary complications (composite including respiratory failure, respiratory infection, pleural effusion, atelectasis, pneumothorax, bronchospasm, aspiration pneumonitis) [employer].es - based on age, anemia, pulse ox, resp infection prior 30d, incision site, duration of surgery, and emergency v elective surgery Low risk 1.6% risk of in-hospital post-op pulmonary complications       Glenford Bayley, NP 10/11/2020

## 2020-10-11 NOTE — Assessment & Plan Note (Addendum)
-   Patient has mild obstruction on PFTs. He is well controlled on low dose ICS/LABA. No recent respiratory infections. He is not on oxygen. He is considered low risk for prolonged mech ventilator and/or post-op pulmonary complications.   Major Pulmonary risks identified in the multifactorial risk analysis are but not limited to a) pneumonia; b) recurrent intubation risk; c) prolonged or recurrent acute respiratory failure needing mechanical ventilation; d) prolonged hospitalization; e) DVT/Pulmonary embolism; f) Acute Pulmonary edema  Recommend 1. Short duration of surgery as much as possible and avoid paralytic if possible 2. Recovery in step down or ICU with Pulmonary consultation if needed  3. DVT prophylaxis 4. Aggressive pulmonary toilet with o2, bronchodilatation, and incentive spirometry and early ambulation

## 2020-10-11 NOTE — Patient Instructions (Addendum)
Nice meeting you today Mr. Vale  You are considered a low risk for surgical complications from pulmonary standpoint   Recommendations Continue bronchodilators as prescribed Early ambulation post surgical procedure Wear compression socks Use incentive spirometer 10 breaths every hour while awake   Follow-up As needed

## 2020-10-12 ENCOUNTER — Encounter: Payer: Self-pay | Admitting: Cardiology

## 2020-10-12 DIAGNOSIS — Z96652 Presence of left artificial knee joint: Secondary | ICD-10-CM | POA: Diagnosis present

## 2020-10-12 DIAGNOSIS — T8484XA Pain due to internal orthopedic prosthetic devices, implants and grafts, initial encounter: Secondary | ICD-10-CM | POA: Diagnosis present

## 2020-10-12 NOTE — Progress Notes (Signed)
PERIOPERATIVE PRESCRIPTION FOR IMPLANTED CARDIAC DEVICE PROGRAMMING  Patient Information: Name:  [name] [name]  DOB:  1940/10/11  MRN:  [id number]    Planned Procedure: revision left knee replacement  Surgeon: Dr. [name]  Date of Procedure: 10/15/2020  Cautery will be used.  Position during surgery: unkown   Please send documentation back to:  [facility] [phone] # [phone])    Device Information:  Clinic EP Physician:  Alwyn Pea   Device Type:  Pacemaker Manufacturer and Phone #:  Medtronic: [phone] Pacemaker Dependent?:  Yes.   Date of Last Device Check:  09/02/20 Normal Device Function?:  Yes.    Electrophysiologist's Recommendations:   Have magnet available.  Provide continuous ECG monitoring when magnet is used or reprogramming is to be performed.   Procedure should not interfere with device function.  No device programming or magnet placement needed.  Per Device Clinic Standing Orders, Lenor Coffin, RN  3:40 PM 10/12/2020

## 2020-10-12 NOTE — Progress Notes (Signed)
Anesthesia Chart Review   Case: [id number] Date/Time: 10/15/20 0700   Procedure: REVISION LEFT KNEE REPLACEMENT BEARING (Left Knee)   Anesthesia type: Spinal   Pre-op diagnosis: LOOSE LEFT KNEE REPLACEMENT   Location: [facility] 04 / [facility]   Surgeons: [name], MD      DISCUSSION:80 y.o. 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 [name].   Pt last seen by pulmonologist 10/11/2020. Per OV note, "Patient has mild obstruction on PFTs. He is well controlled on low dose ICS/LABA. No recent respiratory infections. He is not on oxygen. He is considered low risk for prolonged mech ventilator and/or post-op pulmonary complications."  Per cardiology preoperative evaluation, "Chart reviewed as part of pre-operative protocol coverage. Patient was last seen by Dr. Flora Lipps in 07/2020. Patient was contacted today for further pre-op evaluation and reports doing well since his last visit. He denies any recent chest pain. He has chronic dyspnea due to his COPD but states this is stable. No orthopnea, PND, or significant lower extremity edema. No palpitations or near syncope/syncope. Recent cardiac cath in 07/2020 showed only minimal CAD. Echo at that time showed normal LV function. Given past medical history and time since last visit, based on ACC/AHA guidelines, [name] [name] would be at acceptable risk for the planned procedure without further cardiovascular testing."  Creatinine discussed with PCP who states baseline around 1.54.  Dr. Hyacinth Meeker states she feels pt is ok to proceed with planned procedure.   Device orders requested.  VS: BP 137/90   Pulse 80   Temp 36.7 C (Oral)   Resp 16   Ht 6' (1.829 m)   Wt 96.2 kg   SpO2 97%   BMI 28.75 kg/m   PROVIDERS: Sigmund Hazel, MD is PCP   Lennie Odor, MD is Cardiologist   Sandrea Hughs, MD is Pulmonologist  LABS: Labs reviewed: Acceptable for surgery. (all labs  ordered are listed, but only abnormal results are displayed)  Labs Reviewed  BASIC METABOLIC PANEL - Abnormal; Notable for the following components:      Result Value   Potassium 5.3 (*)    Glucose, Bld 106 (*)    Creatinine, Ser 2.02 (*)    Calcium 8.5 (*)    GFR, Estimated 33 (*)    Anion gap 3 (*)    All other components within normal limits  URINALYSIS, ROUTINE W REFLEX MICROSCOPIC - Abnormal; Notable for the following components:   Hgb urine dipstick SMALL (*)    All other components within normal limits  SURGICAL PCR SCREEN  CBC WITH DIFFERENTIAL/PLATELET  PROTIME-INR  APTT  TYPE AND SCREEN     IMAGES:   EKG: 08/16/2020 Ventricular paced rhythm with occasional AV dual-paced complexes and with frequent premature ventricular complexes  CV: Cardiac Cath 08/03/2020   Mid Cx lesion is 20% stenosed.  Mid LAD lesion is 20% stenosed.  Dist LAD lesion is 20% stenosed.   1. Mild non-obstructive CAD 2. Normal LV filling pressure 3. Non-cardiac chest pain  Recommendations: Medical management of mild non-obstructive CAD  Echo 08/03/2020 1. Left ventricular ejection fraction, by estimation, is 60 to 65%. The  left ventricle has normal function. The left ventricle has no regional  wall motion abnormalities. Left ventricular diastolic parameters are  consistent with Grade I diastolic  dysfunction (impaired relaxation).  2. Right ventricular systolic function is low normal. The right  ventricular size is normal. Tricuspid  regurgitation signal is inadequate  for assessing PA pressure.  3. The mitral valve is grossly normal. No evidence of mitral valve  regurgitation.  4. The aortic valve is tricuspid. Aortic valve regurgitation is not  visualized.  5. Aortic dilatation noted. There is borderline dilatation of the aortic  root, measuring 38 mm. The ascending aorta is not well-visualized.  6. The inferior vena cava is normal in size with greater than 50%  respiratory  variability, suggesting right atrial pressure of 3 mmHg. Past Medical History:  Diagnosis Date  . Arthritis   . Chronic kidney disease    high creatinine level   . Colon polyp March 2010   colonoscopy with Dr Randa Evens  . COPD (chronic obstructive pulmonary disease) (HCC)    mild   . Elevated fasting glucose   . Hepatitis   . History of BPH    with obstruction and hydronephrosis and slihtly elevated creatinine, using self catherization, followed by urologist Dr McDiarmid  . History of ETT 2012   [name]  . History of tobacco use    54 pack yr  . HTN (hypertension)   . Paroxysmal supraventricular tachycardia (HCC)   . Presence of permanent cardiac pacemaker   . Screening for AAA (abdominal aortic aneurysm) 3/16   3cm, nl/early aneurysm, repeat 3 yrs with u/s 3/19    Past Surgical History:  Procedure Laterality Date  . APPENDECTOMY    . LEFT HEART CATH AND CORONARY ANGIOGRAPHY N/A 08/03/2020   Procedure: LEFT HEART CATH AND CORONARY ANGIOGRAPHY;  Surgeon: Kathleene Hazel, MD;  Location: [facility] INVASIVE CV LAB;  Service: Cardiovascular;  Laterality: N/A;  . PACEMAKER IMPLANT N/A 03/02/2020   Procedure: PACEMAKER IMPLANT;  Surgeon: Lanier Prude, MD;  Location: [facility] INVASIVE CV LAB;  Service: Cardiovascular;  Laterality: N/A;  . REPLACEMENT TOTAL KNEE BILATERAL  [id number]  . TRANSURETHRAL RESECTION OF PROSTATE  2010    MEDICATIONS: . acetaminophen (TYLENOL) 325 MG tablet  . aspirin EC 81 MG EC tablet  . atorvastatin (LIPITOR) 10 MG tablet  . calcium carbonate (TUMS - DOSED IN MG ELEMENTAL CALCIUM) 500 MG chewable tablet  . losartan (COZAAR) 100 MG tablet  . mometasone-formoterol (DULERA) 100-5 MCG/ACT AERO  . trimethoprim (TRIMPEX) 100 MG tablet   No current facility-administered medications for this encounter.     Jodell Cipro, PA-C [facility] Pre-Surgical Testing [phone]

## 2020-10-12 NOTE — Anesthesia Preprocedure Evaluation (Addendum)
Anesthesia Evaluation  Patient identified by MRN, date of birth, ID band Patient awake    Reviewed: Allergy & Precautions, NPO status , Patient's Chart, lab work & pertinent test results  Airway Mallampati: I  TM Distance: >3 FB Neck ROM: Full    Dental no notable dental hx. (+) Teeth Intact, Dental Advisory Given   Pulmonary shortness of breath, COPD, former smoker,  COVID+ 07/2020   Pulmonary exam normal breath sounds clear to auscultation       Cardiovascular hypertension, Pt. on medications Normal cardiovascular exam+ dysrhythmias Supra Ventricular Tachycardia + pacemaker (2/2 SSS)  Rhythm:Regular Rate:Normal  TTE 2022 1. Left ventricular ejection fraction, by estimation, is 60 to 65%. The  left ventricle has normal function. The left ventricle has no regional  wall motion abnormalities. Left ventricular diastolic parameters are  consistent with Grade I diastolic  dysfunction (impaired relaxation).  2. Right ventricular systolic function is low normal. The right  ventricular size is normal. Tricuspid regurgitation signal is inadequate  for assessing PA pressure.  3. The mitral valve is grossly normal. No evidence of mitral valve  regurgitation.  4. The aortic valve is tricuspid. Aortic valve regurgitation is not  visualized.  5. Aortic dilatation noted. There is borderline dilatation of the aortic  root, measuring 38 mm. The ascending aorta is not well-visualized.  6. The inferior vena cava is normal in size with greater than 50%  respiratory variability, suggesting right atrial pressure of 3 mmHg.  EKG: 08/16/2020 Ventricular paced rhythm with occasional AV dual-paced complexes and with frequent premature ventricular complexes  Cardiac Cath 08/03/2020 Mid Cx lesion is 20% stenosed. Mid LAD lesion is 20% stenosed. Dist LAD lesion is 20% stenosed. 1. Mild non-obstructive CAD 2. Normal LV filling pressure 3.  Non-cardiac chest pain   Neuro/Psych negative neurological ROS  negative psych ROS   GI/Hepatic negative GI ROS, Neg liver ROS,   Endo/Other  negative endocrine ROS  Renal/GU Renal disease (Cr 2.02, K 5.3)  negative genitourinary   Musculoskeletal negative musculoskeletal ROS (+)   Abdominal   Peds  Hematology negative hematology ROS (+)   Anesthesia Other Findings   Reproductive/Obstetrics                          Anesthesia Physical Anesthesia Plan  ASA: III  Anesthesia Plan: Spinal and Regional   Post-op Pain Management:  Regional for Post-op pain   Induction:   PONV Risk Score and Plan: 1 and Treatment may vary due to age or medical condition, Propofol infusion and Ondansetron  Airway Management Planned: Natural Airway  Additional Equipment:   Intra-op Plan:   Post-operative Plan:   Informed Consent: I have reviewed the patients History and Physical, chart, labs and discussed the procedure including the risks, benefits and alternatives for the proposed anesthesia with the patient or authorized representative who has indicated his/her understanding and acceptance.     Dental advisory given  Plan Discussed with: CRNA  Anesthesia Plan Comments:        Anesthesia Quick Evaluation

## 2020-10-12 NOTE — H&P (Signed)
TOTAL KNEE REVISION ADMISSION H&P  Patient is being admitted for left revision total knee arthroplasty.  Subjective:  Chief Complaint:left knee pain.  HPI: [name] [name], 80 y.o. male, has a history of pain and functional disability in the left knee(s) due to failed previous arthroplasty and patient has failed non-surgical conservative treatments for greater than 12 weeks to include NSAID's and/or analgesics, use of assistive devices, weight reduction as appropriate and activity modification. The indications for the revision of the total knee arthroplasty are bearing surface wear leading to implant or knee misalignment. Onset of symptoms was gradual starting 1 years ago with rapidlly worsening course since that time.  Prior procedures on the left knee(s) include arthroplasty.  Patient currently rates pain in the left knee(s) at 10 out of 10 with activity. There is worsening of pain with activity and weight bearing, pain that interferes with activities of daily living, pain with passive range of motion and joint swelling.  Patient has evidence of joint space narrowing by imaging studies. This condition presents safety issues increasing the risk of falls.    There is no current active infection.  Patient Active Problem List   Diagnosis Date Noted  . Pre-operative respiratory examination 10/11/2020  . Unstable angina (HCC) 08/03/2020  . Chest pain 08/02/2020  . Syncope 06/06/2020  . 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 Pacemaker 06/06/2020  . Bradycardia 02/29/2020  . Upper airway cough syndrome 08/11/2017  . Dyspnea 07/18/2015  . Right bundle branch block 07/18/2015  . History of bradycardia 07/18/2015  . COPD, group B, by GOLD 2017 classification (HCC) 07/18/2015  . Essential hypertension 07/18/2015   Past Medical History:  Diagnosis Date  . Arthritis   . Chronic kidney disease    high creatinine level   . Colon polyp March 2010   colonoscopy with Dr Randa Evens  . COPD (chronic  obstructive pulmonary disease) (HCC)    mild   . Elevated fasting glucose   . Hepatitis   . History of BPH    with obstruction and hydronephrosis and slihtly elevated creatinine, using self catherization, followed by urologist Dr McDiarmid  . History of ETT 2012   [name]  . History of tobacco use    54 pack yr  . HTN (hypertension)   . Paroxysmal supraventricular tachycardia (HCC)   . Presence of permanent cardiac pacemaker   . Screening for AAA (abdominal aortic aneurysm) 3/16   3cm, nl/early aneurysm, repeat 3 yrs with u/s 3/19    Past Surgical History:  Procedure Laterality Date  . APPENDECTOMY    . LEFT HEART CATH AND CORONARY ANGIOGRAPHY N/A 08/03/2020   Procedure: LEFT HEART CATH AND CORONARY ANGIOGRAPHY;  Surgeon: Kathleene Hazel, MD;  Location: [facility] INVASIVE CV LAB;  Service: Cardiovascular;  Laterality: N/A;  . PACEMAKER IMPLANT N/A 03/02/2020   Procedure: PACEMAKER IMPLANT;  Surgeon: Lanier Prude, MD;  Location: [facility] INVASIVE CV LAB;  Service: Cardiovascular;  Laterality: N/A;  . REPLACEMENT TOTAL KNEE BILATERAL  [id number]  . TRANSURETHRAL RESECTION OF PROSTATE  2010    No current facility-administered medications for this encounter.   Current Outpatient Medications  Medication Sig Dispense Refill Last Dose  . acetaminophen (TYLENOL) 325 MG tablet Take 650 mg by mouth every 6 (six) hours as needed for moderate pain or headache.     [name] aspirin EC 81 MG EC tablet Take 1 tablet (81 mg total) by mouth daily. Swallow whole. 30 tablet 11   . atorvastatin (LIPITOR) 10 MG tablet Take  10 mg by mouth daily.     . calcium carbonate (TUMS - DOSED IN MG ELEMENTAL CALCIUM) 500 MG chewable tablet Chew 1 tablet by mouth 2 (two) times daily as needed for indigestion or heartburn.     . losartan (COZAAR) 100 MG tablet Take 100 mg by mouth daily.     . mometasone-formoterol (DULERA) 100-5 MCG/ACT AERO Inhale 2 puffs into the lungs in the morning and at bedtime. 13 g 5   .  trimethoprim (TRIMPEX) 100 MG tablet Take 100 mg by mouth daily.      Allergies  Allergen Reactions  . Nsaids     REACTION: abnormal kidney fuction    Social History   Tobacco Use  . Smoking status: Former Smoker    Packs/day: 3.00    Years: 30.00    Pack years: 90.00    Types: Cigarettes    Quit date: 06/02/1985    Years since quitting: 35.3  . Smokeless tobacco: Never Used  Substance Use Topics  . Alcohol use: Yes    Alcohol/week: 0.0 standard drinks    Comment: daily wine, occasional vodka    Family History  Problem Relation Age of Onset  . Kidney disease Father        ESRD  . Healthy Brother   . Healthy Brother   . Colon cancer Neg Hx   . Colon polyps Neg Hx       Review of Systems  Constitutional: Negative.   HENT: Negative.   Eyes: Negative.   Respiratory: Positive for shortness of breath.   Cardiovascular:       HTN  Gastrointestinal: Negative.   Endocrine: Negative.   Genitourinary: Negative.   Musculoskeletal: Positive for arthralgias and joint swelling.  Allergic/Immunologic: Negative.   Neurological: Negative.   Hematological: Negative.   Psychiatric/Behavioral: Negative.      Objective:  Physical Exam Constitutional:      Appearance: Normal appearance. He is normal weight.  HENT:     Head: Normocephalic and atraumatic.     Nose: Nose normal.  Eyes:     Pupils: Pupils are equal, round, and reactive to light.  Cardiovascular:     Pulses: Normal pulses.  Pulmonary:     Effort: Pulmonary effort is normal.  Musculoskeletal:        General: Swelling present.     Cervical back: Normal range of motion and neck supple.     Comments:  Today the left total knee has a 1+ effusion collateral ligaments are stable good quadriceps and hamstring power.    Neurological:     General: No focal deficit present.     Mental Status: He is alert and oriented to person, place, and time. Mental status is at baseline.  Psychiatric:        Mood and Affect: Mood  normal.        Behavior: Behavior normal.        Thought Content: Thought content normal.        Judgment: Judgment normal.     Vital signs in last 24 hours: Temp:  [98.1 F (36.7 C)] 98.1 F (36.7 C) (05/12 1420) Pulse Rate:  [80] 80 (05/12 1420) Resp:  [16] 16 (05/12 1420) BP: (137)/(90) 137/90 (05/12 1420) SpO2:  [97 %] 97 % (05/12 1420) Weight:  [96.2 kg] 96.2 kg (05/12 1420)  Labs:  Estimated body mass index is 28.75 kg/m as calculated from the following:   Height as of 10/11/20: 6' (1.829 m).   Weight  as of 10/11/20: 96.2 kg.  Imaging Review Plain radiographs demonstrate bilateral AP weightbearing, lateral and sunrise views of the left knee are taken and reviewed in office today.  Overall the implants appear stable without signs of loosening.  We do question whether the bearing could be worn medially.      Assessment/Plan:  End stage arthritis, left knee(s) with failed previous arthroplasty.   The patient history, physical examination, clinical judgment of the provider and imaging studies are consistent with end stage degenerative joint disease of the left knee(s), previous total knee arthroplasty. Revision total knee arthroplasty is deemed medically necessary. The treatment options including medical management, injection therapy, arthroscopy and revision arthroplasty were discussed at length. The risks and benefits of revision total knee arthroplasty were presented and reviewed. The risks due to aseptic loosening, infection, stiffness, patella tracking problems, thromboembolic complications and other imponderables were discussed. The patient acknowledged the explanation, agreed to proceed with the plan and consent was signed. Patient is being admitted for inpatient treatment for surgery, pain control, PT, OT, prophylactic antibiotics, VTE prophylaxis, progressive ambulation and ADL's and discharge planning.The patient is planning to be discharged home with home health  services

## 2020-10-14 MED ORDER — BUPIVACAINE LIPOSOME 1.3 % IJ SUSP
20.0000 mL | INTRAMUSCULAR | Status: DC
  Filled 2020-10-14: qty 20

## 2020-10-15 ENCOUNTER — Ambulatory Visit (HOSPITAL_COMMUNITY)
Admission: RE | Admit: 2020-10-15 | Discharge: 2020-10-15 | Disposition: A | Discharge status: Home Health | Payer: Medicare Other | Attending: Orthopedic Surgery | Admitting: Orthopedic Surgery

## 2020-10-15 ENCOUNTER — Encounter (HOSPITAL_COMMUNITY)
Admission: RE | Disposition: A | Discharge status: Home Health | Payer: Self-pay | Source: Home / Self Care | Attending: Orthopedic Surgery

## 2020-10-15 ENCOUNTER — Inpatient Hospital Stay (HOSPITAL_COMMUNITY): Payer: Medicare Other | Admitting: Physician Assistant

## 2020-10-15 ENCOUNTER — Encounter (HOSPITAL_COMMUNITY): Payer: Self-pay | Admitting: Orthopedic Surgery

## 2020-10-15 ENCOUNTER — Inpatient Hospital Stay (HOSPITAL_COMMUNITY): Payer: Medicare Other | Admitting: Anesthesiology

## 2020-10-15 DIAGNOSIS — Z886 Allergy status to analgesic agent status: Secondary | ICD-10-CM | POA: Diagnosis not present

## 2020-10-15 DIAGNOSIS — Z96653 Presence of artificial knee joint, bilateral: Secondary | ICD-10-CM | POA: Insufficient documentation

## 2020-10-15 DIAGNOSIS — Z792 Long term (current) use of antibiotics: Secondary | ICD-10-CM | POA: Insufficient documentation

## 2020-10-15 DIAGNOSIS — Z87891 Personal history of nicotine dependence: Secondary | ICD-10-CM | POA: Insufficient documentation

## 2020-10-15 DIAGNOSIS — G8918 Other acute postprocedural pain: Secondary | ICD-10-CM | POA: Diagnosis not present

## 2020-10-15 DIAGNOSIS — M1712 Unilateral primary osteoarthritis, left knee: Secondary | ICD-10-CM | POA: Diagnosis not present

## 2020-10-15 DIAGNOSIS — Z79899 Other long term (current) drug therapy: Secondary | ICD-10-CM | POA: Diagnosis not present

## 2020-10-15 DIAGNOSIS — Z7982 Long term (current) use of aspirin: Secondary | ICD-10-CM | POA: Diagnosis not present

## 2020-10-15 DIAGNOSIS — Z950 Presence of cardiac pacemaker: Secondary | ICD-10-CM | POA: Diagnosis not present

## 2020-10-15 DIAGNOSIS — I471 Supraventricular tachycardia: Secondary | ICD-10-CM | POA: Diagnosis not present

## 2020-10-15 DIAGNOSIS — T8484XA Pain due to internal orthopedic prosthetic devices, implants and grafts, initial encounter: Secondary | ICD-10-CM | POA: Diagnosis not present

## 2020-10-15 DIAGNOSIS — Y792 Prosthetic and other implants, materials and accessory orthopedic devices associated with adverse incidents: Secondary | ICD-10-CM | POA: Insufficient documentation

## 2020-10-15 DIAGNOSIS — T84013A Broken internal left knee prosthesis, initial encounter: Secondary | ICD-10-CM | POA: Diagnosis not present

## 2020-10-15 DIAGNOSIS — T84063A Wear of articular bearing surface of internal prosthetic left knee joint, initial encounter: Secondary | ICD-10-CM | POA: Diagnosis not present

## 2020-10-15 DIAGNOSIS — Z7951 Long term (current) use of inhaled steroids: Secondary | ICD-10-CM | POA: Insufficient documentation

## 2020-10-15 DIAGNOSIS — Z96652 Presence of left artificial knee joint: Secondary | ICD-10-CM | POA: Diagnosis not present

## 2020-10-15 HISTORY — PX: TOTAL KNEE REVISION: SHX996

## 2020-10-15 LAB — TYPE AND SCREEN
ABO/RH(D): O POS
Antibody Screen: NEGATIVE

## 2020-10-15 LAB — ABO/RH: ABO/RH(D): O POS

## 2020-10-15 SURGERY — TOTAL KNEE REVISION
Anesthesia: Regional | Site: Knee | Laterality: Left

## 2020-10-15 MED ORDER — MIDAZOLAM HCL 2 MG/2ML IJ SOLN
INTRAMUSCULAR | Status: AC
  Filled 2020-10-15: qty 2

## 2020-10-15 MED ORDER — BUPIVACAINE-EPINEPHRINE 0.25% -1:200000 IJ SOLN
INTRAMUSCULAR | Status: DC | PRN
  Administered 2020-10-15: 20 mL

## 2020-10-15 MED ORDER — DEXAMETHASONE SODIUM PHOSPHATE 10 MG/ML IJ SOLN
INTRAMUSCULAR | Status: DC | PRN
  Administered 2020-10-15: 5 mg

## 2020-10-15 MED ORDER — CHLORHEXIDINE GLUCONATE 0.12 % MT SOLN
15.0000 mL | Freq: Once | OROMUCOSAL | Status: AC
  Administered 2020-10-15: 15 mL via OROMUCOSAL

## 2020-10-15 MED ORDER — BUPIVACAINE-EPINEPHRINE (PF) 0.25% -1:200000 IJ SOLN
INTRAMUSCULAR | Status: AC
  Filled 2020-10-15: qty 30

## 2020-10-15 MED ORDER — LACTATED RINGERS IV BOLUS
250.0000 mL | Freq: Once | INTRAVENOUS | Status: AC
  Administered 2020-10-15: 250 mL via INTRAVENOUS

## 2020-10-15 MED ORDER — POVIDONE-IODINE 10 % EX SWAB
2.0000 "application " | Freq: Once | CUTANEOUS | Status: AC
  Administered 2020-10-15: 2 via TOPICAL

## 2020-10-15 MED ORDER — MIDAZOLAM HCL 5 MG/5ML IJ SOLN
INTRAMUSCULAR | Status: DC | PRN
  Administered 2020-10-15: 1 mg via INTRAVENOUS

## 2020-10-15 MED ORDER — TRANEXAMIC ACID-NACL 1000-0.7 MG/100ML-% IV SOLN
1000.0000 mg | INTRAVENOUS | Status: AC
  Administered 2020-10-15: 1000 mg via INTRAVENOUS
  Filled 2020-10-15: qty 100

## 2020-10-15 MED ORDER — PROPOFOL 500 MG/50ML IV EMUL
INTRAVENOUS | Status: DC | PRN
  Administered 2020-10-15: 25 ug/kg/min via INTRAVENOUS

## 2020-10-15 MED ORDER — ASPIRIN EC 81 MG PO TBEC: 81.0000 mg | DELAYED_RELEASE_TABLET | Freq: Two times a day (BID) | ORAL | 0 refills | Status: DC

## 2020-10-15 MED ORDER — BUPIVACAINE LIPOSOME 1.3 % IJ SUSP
INTRAMUSCULAR | Status: DC | PRN
  Administered 2020-10-15: 20 mL

## 2020-10-15 MED ORDER — PHENYLEPHRINE 40 MCG/ML (10ML) SYRINGE FOR IV PUSH (FOR BLOOD PRESSURE SUPPORT)
PREFILLED_SYRINGE | INTRAVENOUS | Status: DC | PRN
  Administered 2020-10-15 (×3): 40 ug via INTRAVENOUS
  Administered 2020-10-15 (×2): 80 ug via INTRAVENOUS
  Administered 2020-10-15 (×3): 40 ug via INTRAVENOUS

## 2020-10-15 MED ORDER — SODIUM CHLORIDE (PF) 0.9 % IJ SOLN
INTRAMUSCULAR | Status: AC
  Filled 2020-10-15: qty 10

## 2020-10-15 MED ORDER — ONDANSETRON HCL 4 MG/2ML IJ SOLN
INTRAMUSCULAR | Status: AC
  Filled 2020-10-15: qty 2

## 2020-10-15 MED ORDER — FENTANYL CITRATE (PF) 100 MCG/2ML IJ SOLN
INTRAMUSCULAR | Status: AC
  Filled 2020-10-15: qty 2

## 2020-10-15 MED ORDER — ACETAMINOPHEN 500 MG PO TABS
1000.0000 mg | ORAL_TABLET | Freq: Once | ORAL | Status: AC
  Administered 2020-10-15: 1000 mg via ORAL
  Filled 2020-10-15: qty 2

## 2020-10-15 MED ORDER — TIZANIDINE HCL 2 MG PO TABS: 2.0000 mg | ORAL_TABLET | Freq: Four times a day (QID) | ORAL | 0 refills | Status: DC | PRN

## 2020-10-15 MED ORDER — ROPIVACAINE HCL 5 MG/ML IJ SOLN
INTRAMUSCULAR | Status: DC | PRN
  Administered 2020-10-15: 20 mL via PERINEURAL

## 2020-10-15 MED ORDER — PROPOFOL 500 MG/50ML IV EMUL
INTRAVENOUS | Status: AC
  Filled 2020-10-15: qty 50

## 2020-10-15 MED ORDER — METHOCARBAMOL 500 MG PO TABS: 500.0000 mg | ORAL_TABLET | Freq: Four times a day (QID) | ORAL | Status: DC | PRN

## 2020-10-15 MED ORDER — CEFAZOLIN SODIUM-DEXTROSE 2-4 GM/100ML-% IV SOLN
2.0000 g | INTRAVENOUS | Status: AC
  Administered 2020-10-15: 2 g via INTRAVENOUS
  Filled 2020-10-15: qty 100

## 2020-10-15 MED ORDER — PHENYLEPHRINE 40 MCG/ML (10ML) SYRINGE FOR IV PUSH (FOR BLOOD PRESSURE SUPPORT)
PREFILLED_SYRINGE | INTRAVENOUS | Status: AC
  Filled 2020-10-15: qty 10

## 2020-10-15 MED ORDER — 0.9 % SODIUM CHLORIDE (POUR BTL) OPTIME
TOPICAL | Status: DC | PRN
  Administered 2020-10-15: 1000 mL

## 2020-10-15 MED ORDER — MEPIVACAINE HCL (PF) 2 % IJ SOLN
INTRAMUSCULAR | Status: DC | PRN
  Administered 2020-10-15: 50 mg via EPIDURAL

## 2020-10-15 MED ORDER — ONDANSETRON HCL 4 MG/2ML IJ SOLN
INTRAMUSCULAR | Status: DC | PRN
  Administered 2020-10-15: 4 mg via INTRAVENOUS

## 2020-10-15 MED ORDER — LACTATED RINGERS IV BOLUS
500.0000 mL | Freq: Once | INTRAVENOUS | Status: AC
  Administered 2020-10-15: 500 mL via INTRAVENOUS

## 2020-10-15 MED ORDER — SODIUM CHLORIDE (PF) 0.9 % IJ SOLN
INTRAMUSCULAR | Status: DC | PRN
  Administered 2020-10-15: 70 mL

## 2020-10-15 MED ORDER — OXYCODONE-ACETAMINOPHEN 5-325 MG PO TABS: 1.0000 | ORAL_TABLET | ORAL | 0 refills | Status: DC | PRN

## 2020-10-15 MED ORDER — TRANEXAMIC ACID 1000 MG/10ML IV SOLN
2000.0000 mg | INTRAVENOUS | Status: DC
  Filled 2020-10-15 (×2): qty 20

## 2020-10-15 MED ORDER — LACTATED RINGERS IV SOLN: INTRAVENOUS | Status: DC

## 2020-10-15 MED ORDER — FENTANYL CITRATE (PF) 100 MCG/2ML IJ SOLN
INTRAMUSCULAR | Status: DC | PRN
  Administered 2020-10-15: 50 ug via INTRAVENOUS

## 2020-10-15 MED ORDER — TRANEXAMIC ACID 1000 MG/10ML IV SOLN
INTRAVENOUS | Status: DC | PRN
  Administered 2020-10-15: 2000 mg via TOPICAL

## 2020-10-15 MED ORDER — METHOCARBAMOL 500 MG IVPB - SIMPLE MED: 500.0000 mg | Freq: Four times a day (QID) | INTRAVENOUS | Status: DC | PRN

## 2020-10-15 MED ORDER — DEXAMETHASONE SODIUM PHOSPHATE 10 MG/ML IJ SOLN
INTRAMUSCULAR | Status: DC | PRN
  Administered 2020-10-15: 5 mg via INTRAVENOUS

## 2020-10-15 MED ORDER — TRANEXAMIC ACID-NACL 1000-0.7 MG/100ML-% IV SOLN
1000.0000 mg | Freq: Once | INTRAVENOUS | Status: DC
Start: 2020-10-15 — End: 2020-10-15

## 2020-10-15 MED ORDER — PROPOFOL 10 MG/ML IV BOLUS
INTRAVENOUS | Status: DC | PRN
  Administered 2020-10-15: 10 mg via INTRAVENOUS
  Administered 2020-10-15: 20 mg via INTRAVENOUS

## 2020-10-15 MED ORDER — DEXAMETHASONE SODIUM PHOSPHATE 10 MG/ML IJ SOLN
INTRAMUSCULAR | Status: AC
  Filled 2020-10-15: qty 1

## 2020-10-15 MED ORDER — FENTANYL CITRATE (PF) 100 MCG/2ML IJ SOLN: 25.0000 ug | INTRAMUSCULAR | Status: DC | PRN

## 2020-10-15 SURGICAL SUPPLY — 55 items
BAG DECANTER FOR FLEXI CONT (MISCELLANEOUS) ×2 IMPLANT
BAG SPEC THK2 15X12 ZIP CLS (MISCELLANEOUS) ×1
BAG ZIPLOCK 12X15 (MISCELLANEOUS) ×2 IMPLANT
BLADE OSCILLATING/SAGITTAL (BLADE) ×4
BLADE SAG 18X100X1.27 (BLADE) ×2 IMPLANT
BLADE SAW SGTL 11.0X1.19X90.0M (BLADE) ×2 IMPLANT
BLADE SAW SGTL 81X20 HD (BLADE) ×2 IMPLANT
BLADE SURG SZ10 CARB STEEL (BLADE) ×4 IMPLANT
BLADE SW THK.38XMED LNG THN (BLADE) ×2 IMPLANT
BNDG CMPR MED 10X6 ELC LF (GAUZE/BANDAGES/DRESSINGS) ×2
BNDG ELASTIC 6X10 VLCR STRL LF (GAUZE/BANDAGES/DRESSINGS) ×4 IMPLANT
BOWL SMART MIX CTS (DISPOSABLE) ×2 IMPLANT
BRUSH FEMORAL CANAL (MISCELLANEOUS) ×2 IMPLANT
BUR OVAL CARBIDE 4.0 (BURR) IMPLANT
COVER SURGICAL LIGHT HANDLE (MISCELLANEOUS) ×2 IMPLANT
COVER WAND RF STERILE (DRAPES) IMPLANT
CUFF TOURN SGL QUICK 34 (TOURNIQUET CUFF) ×2
CUFF TRNQT CYL 34X4.125X (TOURNIQUET CUFF) ×1 IMPLANT
DECANTER SPIKE VIAL GLASS SM (MISCELLANEOUS) ×6 IMPLANT
DRAPE ORTHO SPLIT 77X108 STRL (DRAPES) ×4
DRAPE SURG ORHT 6 SPLT 77X108 (DRAPES) ×2 IMPLANT
DRAPE U-SHAPE 47X51 STRL (DRAPES) ×2 IMPLANT
DRESSING AQUACEL AG SP 3.5X10 (GAUZE/BANDAGES/DRESSINGS) IMPLANT
DRSG AQUACEL AG ADV 3.5X14 (GAUZE/BANDAGES/DRESSINGS) IMPLANT
DRSG AQUACEL AG SP 3.5X10 (GAUZE/BANDAGES/DRESSINGS)
DURAPREP 26ML APPLICATOR (WOUND CARE) ×2 IMPLANT
ELECT REM PT RETURN 15FT ADLT (MISCELLANEOUS) ×2 IMPLANT
GLOVE SRG 8 PF TXTR STRL LF DI (GLOVE) ×1 IMPLANT
GLOVE SURG ENC MOIS LTX SZ7.5 (GLOVE) ×2 IMPLANT
GLOVE SURG ENC MOIS LTX SZ8.5 (GLOVE) ×2 IMPLANT
GLOVE SURG UNDER POLY LF SZ8 (GLOVE) ×2
GLOVE SURG UNDER POLY LF SZ9 (GLOVE) ×2 IMPLANT
GOWN STRL REUS W/TWL XL LVL3 (GOWN DISPOSABLE) ×4 IMPLANT
HANDPIECE INTERPULSE COAX TIP (DISPOSABLE) ×2
HOOD PEEL AWAY FLYTE STAYCOOL (MISCELLANEOUS) ×6 IMPLANT
INSERT TIB SCORPIO FLEX 11 10 (Insert) ×1 IMPLANT
KIT TURNOVER KIT A (KITS) ×2 IMPLANT
NDL HYPO 21X1.5 SAFETY (NEEDLE) ×2 IMPLANT
NEEDLE HYPO 21X1.5 SAFETY (NEEDLE) ×4 IMPLANT
NS IRRIG 1000ML POUR BTL (IV SOLUTION) ×2 IMPLANT
PACK TOTAL KNEE CUSTOM (KITS) ×2 IMPLANT
PENCIL SMOKE EVACUATOR (MISCELLANEOUS) IMPLANT
PROTECTOR NERVE ULNAR (MISCELLANEOUS) ×2 IMPLANT
SET HNDPC FAN SPRY TIP SCT (DISPOSABLE) ×1 IMPLANT
STAPLER VISISTAT 35W (STAPLE) IMPLANT
SUT VIC AB 1 CTX 36 (SUTURE) ×2
SUT VIC AB 1 CTX36XBRD ANBCTR (SUTURE) ×1 IMPLANT
SUT VIC AB 3-0 CT1 27 (SUTURE) ×2
SUT VIC AB 3-0 CT1 TAPERPNT 27 (SUTURE) ×1 IMPLANT
SWAB COLLECTION DEVICE MRSA (MISCELLANEOUS) IMPLANT
SWAB CULTURE ESWAB REG 1ML (MISCELLANEOUS) IMPLANT
SYR CONTROL 10ML LL (SYRINGE) ×4 IMPLANT
TRAY FOLEY MTR SLVR 16FR STAT (SET/KITS/TRAYS/PACK) ×2 IMPLANT
WATER STERILE IRR 1000ML POUR (IV SOLUTION) ×4 IMPLANT
WRAP KNEE MAXI GEL POST OP (GAUZE/BANDAGES/DRESSINGS) ×2 IMPLANT

## 2020-10-15 NOTE — Anesthesia Postprocedure Evaluation (Signed)
Anesthesia Post Note  Patient: MARQUETTE BLODGETT  Procedure(s) Performed: REVISION LEFT KNEE REPLACEMENT BEARING (Left Knee)     Patient location during evaluation: PACU Anesthesia Type: Regional and Spinal Level of consciousness: oriented and awake and alert Pain management: pain level controlled Vital Signs Assessment: post-procedure vital signs reviewed and stable Respiratory status: spontaneous breathing, respiratory function stable and patient connected to nasal cannula oxygen Cardiovascular status: blood pressure returned to baseline and stable Postop Assessment: no headache, no backache and no apparent nausea or vomiting Anesthetic complications: no   No complications documented.  Last Vitals:  Vitals:   10/15/20 0945 10/15/20 0955  BP: (!) 154/81 (!) 156/78  Pulse: 60 61  Resp: (!) 24 16  Temp:  36.6 C  SpO2: 96% 99%    Last Pain:  Vitals:   10/15/20 0955  TempSrc:   PainSc: 2                  Satara Virella L Rhya Shan

## 2020-10-15 NOTE — Interval H&P Note (Signed)
History and Physical Interval Note:  10/15/2020 7:11 AM  [name] [name]  has presented today for surgery, with the diagnosis of LOOSE LEFT KNEE REPLACEMENT.  The various methods of treatment have been discussed with the patient and family. After consideration of risks, benefits and other options for treatment, the patient has consented to  Procedure(s): REVISION LEFT KNEE REPLACEMENT BEARING (Left) as a surgical intervention.  The patient's history has been reviewed, patient examined, no change in status, stable for surgery.  I have reviewed the patient's chart and labs.  Questions were answered to the patient's satisfaction.     Nestor Lewandowsky

## 2020-10-15 NOTE — Anesthesia Procedure Notes (Signed)
Anesthesia Regional Block: Adductor canal block   Pre-Anesthetic Checklist: ,, timeout performed, Correct Patient, Correct Site, Correct Laterality, Correct Procedure, Correct Position, site marked, Risks and benefits discussed,  Surgical consent,  Pre-op evaluation,  At surgeon's request and post-op pain management  Laterality: Left  Prep: Maximum Sterile Barrier Precautions used, chloraprep       Needles:  Injection technique: Single-shot  Needle Type: Echogenic Stimulator Needle     Needle Length: 9cm  Needle Gauge: 22     Additional Needles:   Procedures:,,,, ultrasound used (permanent image in chart),,,,  Narrative:  Start time: 10/15/2020 7:05 AM End time: 10/15/2020 7:11 AM Injection made incrementally with aspirations every 5 mL.  Performed by: Personally  Anesthesiologist: Elmer Picker, MD  Additional Notes: Monitors applied. No increased pain on injection. No increased resistance to injection. Injection made in 5cc increments. Good needle visualization. Patient tolerated procedure well.

## 2020-10-15 NOTE — Progress Notes (Signed)
Orthopedic Tech Progress Note Patient Details:  [name] [name] 07-03-1940 [id number]  Ortho Devices Type of Ortho Device: Bone foam zero knee       Saul Fordyce 10/15/2020, 9:52 AM

## 2020-10-15 NOTE — Discharge Instructions (Signed)

## 2020-10-15 NOTE — Anesthesia Procedure Notes (Addendum)
Spinal  Patient location during procedure: OR Start time: 10/15/2020 7:22 AM End time: 10/15/2020 7:27 AM Staffing Performed: resident/CRNA  Anesthesiologist: Elmer Picker, MD Resident/CRNA: Marny Lowenstein, CRNA Preanesthetic Checklist Completed: patient identified, IV checked, site marked, risks and benefits discussed, surgical consent, monitors and equipment checked, pre-op evaluation and timeout performed Spinal Block Patient position: sitting Prep: DuraPrep Patient monitoring: heart rate, continuous pulse ox and blood pressure Approach: midline Location: L3-4 Injection technique: single-shot Needle Needle type: Pencan  Needle gauge: 25 G Needle length: 10 cm Assessment Sensory level: T8 Events: CSF return

## 2020-10-15 NOTE — Op Note (Addendum)
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  POSTOPERATIVE DIAGNOSIS: Same   PROCEDURE: Revision of left total knee polyethylene bearing with removal of a number 11 x 10 mm Scorpio bearing and revision to a new 10 mm #11 Scorpio bearing.  Radical synovectomy. SURGEON: [name] J  ASSISTANT: Tomi Likens. [employer] (present throughout entire procedure and necessary for timely completion of the procedure)  ANESTHESIA: Spinal with adductor canal block DRAINS: No drains, in and out catheterization at the end. TOURNIQUET TIME: Not used  INDICATIONS FOR PROCEDURE: 80 year old man had cemented Osteonics Scorpio total knee by me in the year 2005.  Did well until he started having intermittent swelling in the last year came to our office after a 10-year absence where radiographs showed varus configuration of the knee with wear of the medial polyethylene.  Aspiration yielded clear fluid slightly blood-tinged with 1000 cells 10% polys gram stain and culture were negative.  Plain radiographs show no evidence of loosening of the implants themselves he does have some boggy synovium to palpation.  He is taken for planned removal of worn polyethylene bearing and revision.  Other issues will be addressed at surgery if they are found.  Risks and benefits of surgery been discussed and all questions answered.  DESCRIPTION OF PROCEDURE: The patient identified by armband, and taken to the operating room, appropriate anesthetic  monitors were attached and spinal anesthesia induced with  the patient in supine position. Tourniquet applied high to the operative thigh. Lateral post and foot positioner  applied to the table, the lower extremity was then prepped and draped  in usual sterile fashion from the ankle to the tourniquet.  Time-out procedure was performed. We began by injecting the old wound area with 20 cc of a mixture of Exparel and Marcaine and normal saline.  We then recreated a portion of the old anterior midline incision to the skin and subcutaneous tissue down to the level of the quadriceps tendon and the retinaculum over the patella.  This was reflected medially allowing a medial parapatellar arthrotomy we encountered a normal amount of fluid that again was slightly blood-tinged and had no evidence of infection.  The mid superficial medial collateral ligament was elevated off the medial flare of the tibia going around posterior medially.  We also remove some scar tissue from behind the patellar tendon allowing [place] to mobilize the patella laterally.  We then flexed the knee up to 120 degrees and got a good look at the tibial bearing which was in fact badly worn medially and fractured.  Fortunately there was no wear of the metal.  The bearing itself was extracted using 1/4 inch osteotome to lever it out and it removed without difficulty.  The synovium had a great deal of polyethylene particles in it and a fairly radical synovectomy was accomplished sharply and with the Bovie.  The wound was then thoroughly irrigated with pulse lavage and a new 10 mm trial bearing inserted the knee was taken through range of motion and noted to have excellent stability full extension flexion to 140 degrees.  We also examined the patellar button at this time and found some plastic deformation but no significant wear no loosening it was left in place.  We also checked for loosening of the femoral and tibial implants using 1/4 inch osteotome and none was found.  This was consistent with the preoperative x-rays.  At this point a new number 11 x 10 mm Stryker Scorpio bearing was inserted without difficulty.  Knee again taken the range of motion and excellent stability noted.  Exparel was injected into the soft tissues at this time wound was once again  pulse lavaged clean and we began closure using running #1 Vicryl suture in the parapatellar arthrotomy 3-0 Vicryl suture in the subcutaneous tissue followed by a 3-0 Vicryl subcuticular closure.  Aquacil dressing was applied followed by TED hose and an Ace wrap.  The tourniquet was not used during this procedure.  In and out catheterization was performed at the end.  Patient was taken to the recovery room without difficulty.     Nestor Lewandowsky  10/15/2020 2:57 PM

## 2020-10-15 NOTE — Evaluation (Signed)
Physical Therapy Evaluation Patient Details Name: [name] [name] MRN: [id number] DOB: 10-12-1940 Today's Date: 10/15/2020   History of Present Illness  Patient is 80 y.o. male s/pLt  TKR on 10/15/20 with PMH significant for HTN, AAA, COPD, OA, CKD,BPH, Bil TKA.  Clinical Impression  ROEY COOPMAN is a 80 y.o. male POD 0 s/p Lt TKR. Patient reports independence with mobility at baseline. Patient is now limited by functional impairments (see PT problem list below) and requires min guard/supervision for transfers and gait with RW. Patient was able to ambulate ~90 feet with RW and min guard/supervision and cues for safe walker management. Patient educated on safe sequencing for stair mobility and verbalized safe guarding position for people assisting with mobility. Patient instructed in exercises to facilitate ROM and circulation. Patient will benefit from continued skilled PT interventions to address impairments and progress towards PLOF. Patient has met mobility goals at adequate level for discharge home; will continue to follow if pt continues acute stay to progress towards Mod I goals.     Follow Up Recommendations Follow surgeon's recommendation for DC plan and follow-up therapies;Home health PT    Equipment Recommendations  Rolling walker with 5" wheels    Recommendations for Other Services       Precautions / Restrictions Precautions Precautions: Fall Restrictions Weight Bearing Restrictions: No Other Position/Activity Restrictions: WBAT      Mobility  Bed Mobility Overal bed mobility: Needs Assistance Bed Mobility: Supine to Sit     Supine to sit: Supervision;HOB elevated     General bed mobility comments: pt needes extra time, HOB slightly elevated,    Transfers Overall transfer level: Needs assistance Equipment used: Rolling walker (2 wheeled) Transfers: Sit to/from Stand Sit to Stand: Min guard;Supervision         General transfer comment: cues for hand  placement/technique. pt steady with rising  Ambulation/Gait Ambulation/Gait assistance: Supervision;Min guard Gait Distance (Feet): 90 Feet Assistive device: Rolling walker (2 wheeled) Gait Pattern/deviations: Step-to pattern Gait velocity: decr   General Gait Details: cues for step to pattern and proximity to RW, no overt LOB or buckling at Lt knee.  Stairs Stairs: Yes Stairs assistance: Min guard;Supervision Stair Management: Two rails;Step to pattern;Forwards Number of Stairs: 3 General stair comments: cues for step sequencing "up with good, down with bad" and for safe guarding for spouse to provide. no LOB noted.  Wheelchair Mobility    Modified Rankin (Stroke Patients Only)       Balance Overall balance assessment: Needs assistance Sitting-balance support: Feet supported Sitting balance-Leahy Scale: Good     Standing balance support: During functional activity;Bilateral upper extremity supported Standing balance-Leahy Scale: Fair                               Pertinent Vitals/Pain Pain Assessment: 0-10 Pain Score: 3  Pain Location: Lt knee Pain Descriptors / Indicators: Aching;Discomfort Pain Intervention(s): Monitored during session;Repositioned;Limited activity within patient's tolerance    Home Living Family/patient expects to be discharged to:: Private residence Living Arrangements: Spouse/significant other Available Help at Discharge: Family Type of Home: House Home Access: Stairs to enter Entrance Stairs-Rails: Can reach both Entrance Stairs-Number of Steps: 2 Home Layout: Two level;Full bath on main level;Able to live on main level with bedroom/bathroom Home Equipment: Grab bars - tub/shower (walking sticl)      Prior Function Level of Independence: Independent  Hand Dominance   Dominant Hand: Right    Extremity/Trunk Assessment   Upper Extremity Assessment Upper Extremity Assessment: Overall WFL for tasks  assessed    Lower Extremity Assessment Lower Extremity Assessment: Overall WFL for tasks assessed;LLE deficits/detail LLE Deficits / Details: good quad activation, no extensor lag with SLR LLE Sensation: WNL LLE Coordination: WNL    Cervical / Trunk Assessment Cervical / Trunk Assessment: Normal  Communication   Communication: No difficulties  Cognition Arousal/Alertness: Awake/alert Behavior During Therapy: WFL for tasks assessed/performed Overall Cognitive Status: Within Functional Limits for tasks assessed                                        General Comments      Exercises Total Joint Exercises Ankle Circles/Pumps: AROM;Both;15 reps;Seated Quad Sets: AROM;Left;Other reps (comment);Seated (3) Short Arc Quad: AROM;Left;Other reps (comment);Seated (3) Heel Slides: AROM;Left;Other reps (comment);Seated (3) Hip ABduction/ADduction: AROM;Left;Other reps (comment);Seated (2) Straight Leg Raises: AROM;Left;Other reps (comment);Seated (2) Long Arc Quad: AROM;Left;Other reps (comment);Seated (2) Knee Flexion: AROM;Left;Other reps (comment);Seated;AAROM (2)   Assessment/Plan    PT Assessment Patient needs continued PT services  PT Problem List Decreased strength;Decreased range of motion;Decreased activity tolerance;Decreased balance;Decreased mobility;Decreased knowledge of use of DME       PT Treatment Interventions DME instruction;Gait training;Stair training;Functional mobility training;Therapeutic activities;Therapeutic exercise;Balance training;Neuromuscular re-education;Patient/family education    PT Goals (Current goals can be found in the Care Plan section)  Acute Rehab PT Goals Patient Stated Goal: be able to walk dog again PT Goal Formulation: With patient Time For Goal Achievement: 10/22/20 Potential to Achieve Goals: Good    Frequency 7X/week   Barriers to discharge        Co-evaluation               AM-PAC PT "6 Clicks"  Mobility  Outcome Measure Help needed turning from your back to your side while in a flat bed without using bedrails?: None Help needed moving from lying on your back to sitting on the side of a flat bed without using bedrails?: A Little Help needed moving to and from a bed to a chair (including a wheelchair)?: A Little Help needed standing up from a chair using your arms (e.g., wheelchair or bedside chair)?: A Little Help needed to walk in hospital room?: A Little Help needed climbing 3-5 steps with a railing? : A Little 6 Click Score: 19    End of Session Equipment Utilized During Treatment: Gait belt Activity Tolerance: Patient tolerated treatment well Patient left: in chair;with call bell/phone within reach;with chair alarm set;with family/visitor present Nurse Communication: Mobility status PT Visit Diagnosis: Muscle weakness (generalized) (M62.81);Difficulty in walking, not elsewhere classified (R26.2)    Time: 2202-5427 PT Time Calculation (min) (ACUTE ONLY): 33 min   Charges:   PT Evaluation $PT Eval Low Complexity: 1 Low PT Treatments $Gait Training: 8-22 mins        Verner Mould, DPT Acute Rehabilitation Services Office [phone] Pager [phone]    Jacques Navy 10/15/2020, 11:52 AM

## 2020-10-15 NOTE — Transfer of Care (Signed)
Immediate Anesthesia Transfer of Care Note  Patient: [name] [name]  Procedure(s) Performed: REVISION LEFT KNEE REPLACEMENT BEARING (Left Knee)  Patient Location: PACU  Anesthesia Type:MAC, Regional and Spinal  Level of Consciousness: awake, alert  and oriented  Airway & Oxygen Therapy: Patient Spontanous Breathing  Post-op Assessment: Report given to RN and Post -op Vital signs reviewed and stable  Post vital signs: Reviewed and stable  Last Vitals:  Vitals Value Taken Time  BP 138/78 10/15/20 0906  Temp    Pulse 60 10/15/20 0909  Resp 14 10/15/20 0909  SpO2 96 % 10/15/20 0909  Vitals shown include unvalidated device data.  Last Pain:  Vitals:   10/15/20 0624  TempSrc: Oral  PainSc:          Complications: No complications documented.

## 2020-10-16 DIAGNOSIS — N189 Chronic kidney disease, unspecified: Secondary | ICD-10-CM | POA: Diagnosis not present

## 2020-10-16 DIAGNOSIS — K635 Polyp of colon: Secondary | ICD-10-CM | POA: Diagnosis not present

## 2020-10-16 DIAGNOSIS — Z96651 Presence of right artificial knee joint: Secondary | ICD-10-CM | POA: Diagnosis not present

## 2020-10-16 DIAGNOSIS — T84093D Other mechanical complication of internal left knee prosthesis, subsequent encounter: Secondary | ICD-10-CM | POA: Diagnosis not present

## 2020-10-16 DIAGNOSIS — I453 Trifascicular block: Secondary | ICD-10-CM | POA: Diagnosis not present

## 2020-10-16 DIAGNOSIS — Z87891 Personal history of nicotine dependence: Secondary | ICD-10-CM | POA: Diagnosis not present

## 2020-10-16 DIAGNOSIS — I4510 Unspecified right bundle-branch block: Secondary | ICD-10-CM | POA: Diagnosis not present

## 2020-10-16 DIAGNOSIS — J449 Chronic obstructive pulmonary disease, unspecified: Secondary | ICD-10-CM | POA: Diagnosis not present

## 2020-10-16 DIAGNOSIS — Z7982 Long term (current) use of aspirin: Secondary | ICD-10-CM | POA: Diagnosis not present

## 2020-10-16 DIAGNOSIS — K759 Inflammatory liver disease, unspecified: Secondary | ICD-10-CM | POA: Diagnosis not present

## 2020-10-16 DIAGNOSIS — N400 Enlarged prostate without lower urinary tract symptoms: Secondary | ICD-10-CM | POA: Diagnosis not present

## 2020-10-16 DIAGNOSIS — Z950 Presence of cardiac pacemaker: Secondary | ICD-10-CM | POA: Diagnosis not present

## 2020-10-16 DIAGNOSIS — I471 Supraventricular tachycardia: Secondary | ICD-10-CM | POA: Diagnosis not present

## 2020-10-16 DIAGNOSIS — R7301 Impaired fasting glucose: Secondary | ICD-10-CM | POA: Diagnosis not present

## 2020-10-16 DIAGNOSIS — I129 Hypertensive chronic kidney disease with stage 1 through stage 4 chronic kidney disease, or unspecified chronic kidney disease: Secondary | ICD-10-CM | POA: Diagnosis not present

## 2020-10-18 ENCOUNTER — Encounter (HOSPITAL_COMMUNITY): Payer: Self-pay | Admitting: Orthopedic Surgery

## 2020-10-18 DIAGNOSIS — N183 Chronic kidney disease, stage 3 unspecified: Secondary | ICD-10-CM | POA: Diagnosis not present

## 2020-10-18 DIAGNOSIS — Z6829 Body mass index (BMI) 29.0-29.9, adult: Secondary | ICD-10-CM | POA: Diagnosis not present

## 2020-10-18 DIAGNOSIS — I129 Hypertensive chronic kidney disease with stage 1 through stage 4 chronic kidney disease, or unspecified chronic kidney disease: Secondary | ICD-10-CM | POA: Diagnosis not present

## 2020-10-22 DIAGNOSIS — I129 Hypertensive chronic kidney disease with stage 1 through stage 4 chronic kidney disease, or unspecified chronic kidney disease: Secondary | ICD-10-CM | POA: Diagnosis not present

## 2020-10-22 DIAGNOSIS — I471 Supraventricular tachycardia: Secondary | ICD-10-CM | POA: Diagnosis not present

## 2020-10-22 DIAGNOSIS — N189 Chronic kidney disease, unspecified: Secondary | ICD-10-CM | POA: Diagnosis not present

## 2020-10-22 DIAGNOSIS — T84093D Other mechanical complication of internal left knee prosthesis, subsequent encounter: Secondary | ICD-10-CM | POA: Diagnosis not present

## 2020-10-22 DIAGNOSIS — J449 Chronic obstructive pulmonary disease, unspecified: Secondary | ICD-10-CM | POA: Diagnosis not present

## 2020-10-22 DIAGNOSIS — I4510 Unspecified right bundle-branch block: Secondary | ICD-10-CM | POA: Diagnosis not present

## 2020-10-24 DIAGNOSIS — I4510 Unspecified right bundle-branch block: Secondary | ICD-10-CM | POA: Diagnosis not present

## 2020-10-24 DIAGNOSIS — J449 Chronic obstructive pulmonary disease, unspecified: Secondary | ICD-10-CM | POA: Diagnosis not present

## 2020-10-24 DIAGNOSIS — I471 Supraventricular tachycardia: Secondary | ICD-10-CM | POA: Diagnosis not present

## 2020-10-24 DIAGNOSIS — E7800 Pure hypercholesterolemia, unspecified: Secondary | ICD-10-CM | POA: Diagnosis not present

## 2020-10-24 DIAGNOSIS — T84093D Other mechanical complication of internal left knee prosthesis, subsequent encounter: Secondary | ICD-10-CM | POA: Diagnosis not present

## 2020-10-24 DIAGNOSIS — N400 Enlarged prostate without lower urinary tract symptoms: Secondary | ICD-10-CM | POA: Diagnosis not present

## 2020-10-24 DIAGNOSIS — N189 Chronic kidney disease, unspecified: Secondary | ICD-10-CM | POA: Diagnosis not present

## 2020-10-24 DIAGNOSIS — I2510 Atherosclerotic heart disease of native coronary artery without angina pectoris: Secondary | ICD-10-CM | POA: Diagnosis not present

## 2020-10-24 DIAGNOSIS — I129 Hypertensive chronic kidney disease with stage 1 through stage 4 chronic kidney disease, or unspecified chronic kidney disease: Secondary | ICD-10-CM | POA: Diagnosis not present

## 2020-10-24 DIAGNOSIS — N183 Chronic kidney disease, stage 3 unspecified: Secondary | ICD-10-CM | POA: Diagnosis not present

## 2020-10-25 DIAGNOSIS — Z96652 Presence of left artificial knee joint: Secondary | ICD-10-CM | POA: Diagnosis not present

## 2020-10-25 DIAGNOSIS — Z471 Aftercare following joint replacement surgery: Secondary | ICD-10-CM | POA: Diagnosis not present

## 2020-10-26 DIAGNOSIS — I471 Supraventricular tachycardia: Secondary | ICD-10-CM | POA: Diagnosis not present

## 2020-10-26 DIAGNOSIS — T84093D Other mechanical complication of internal left knee prosthesis, subsequent encounter: Secondary | ICD-10-CM | POA: Diagnosis not present

## 2020-10-26 DIAGNOSIS — I4510 Unspecified right bundle-branch block: Secondary | ICD-10-CM | POA: Diagnosis not present

## 2020-10-26 DIAGNOSIS — I129 Hypertensive chronic kidney disease with stage 1 through stage 4 chronic kidney disease, or unspecified chronic kidney disease: Secondary | ICD-10-CM | POA: Diagnosis not present

## 2020-10-26 DIAGNOSIS — N189 Chronic kidney disease, unspecified: Secondary | ICD-10-CM | POA: Diagnosis not present

## 2020-10-26 DIAGNOSIS — J449 Chronic obstructive pulmonary disease, unspecified: Secondary | ICD-10-CM | POA: Diagnosis not present

## 2020-11-09 DIAGNOSIS — Z23 Encounter for immunization: Secondary | ICD-10-CM | POA: Diagnosis not present

## 2020-12-04 ENCOUNTER — Ambulatory Visit (INDEPENDENT_AMBULATORY_CARE_PROVIDER_SITE_OTHER): Payer: Medicare Other

## 2020-12-04 DIAGNOSIS — I453 Trifascicular block: Secondary | ICD-10-CM | POA: Diagnosis not present

## 2020-12-05 LAB — CUP PACEART REMOTE DEVICE CHECK
Battery Remaining Longevity: 129 mo
Battery Voltage: 3.06 V
Brady Statistic AP VP Percent: 78.93 %
Brady Statistic AP VS Percent: 0.03 %
Brady Statistic AS VP Percent: 20.88 %
Brady Statistic AS VS Percent: 0.15 %
Brady Statistic RA Percent Paced: 78.94 %
Brady Statistic RV Percent Paced: 99.82 %
Date Time Interrogation Session: 20220704202951
Implantable Lead Implant Date: 20211001
Implantable Lead Implant Date: 20211001
Implantable Lead Location: 753859
Implantable Lead Location: 753860
Implantable Lead Model: 5076
Implantable Lead Model: 5076
Implantable Pulse Generator Implant Date: 20211001
Lead Channel Impedance Value: 323 Ohm
Lead Channel Impedance Value: 399 Ohm
Lead Channel Impedance Value: 456 Ohm
Lead Channel Impedance Value: 494 Ohm
Lead Channel Pacing Threshold Amplitude: 0.5 V
Lead Channel Pacing Threshold Amplitude: 0.875 V
Lead Channel Pacing Threshold Pulse Width: 0.4 ms
Lead Channel Pacing Threshold Pulse Width: 0.4 ms
Lead Channel Sensing Intrinsic Amplitude: 1.125 mV
Lead Channel Sensing Intrinsic Amplitude: 1.125 mV
Lead Channel Sensing Intrinsic Amplitude: 2.125 mV
Lead Channel Sensing Intrinsic Amplitude: 2.125 mV
Lead Channel Setting Pacing Amplitude: 2 V
Lead Channel Setting Pacing Amplitude: 2 V
Lead Channel Setting Pacing Pulse Width: 0.4 ms
Lead Channel Setting Sensing Sensitivity: 0.9 mV

## 2020-12-13 DIAGNOSIS — N183 Chronic kidney disease, stage 3 unspecified: Secondary | ICD-10-CM | POA: Diagnosis not present

## 2020-12-13 DIAGNOSIS — N400 Enlarged prostate without lower urinary tract symptoms: Secondary | ICD-10-CM | POA: Diagnosis not present

## 2020-12-13 DIAGNOSIS — J449 Chronic obstructive pulmonary disease, unspecified: Secondary | ICD-10-CM | POA: Diagnosis not present

## 2020-12-13 DIAGNOSIS — I2510 Atherosclerotic heart disease of native coronary artery without angina pectoris: Secondary | ICD-10-CM | POA: Diagnosis not present

## 2020-12-13 DIAGNOSIS — E7800 Pure hypercholesterolemia, unspecified: Secondary | ICD-10-CM | POA: Diagnosis not present

## 2020-12-13 DIAGNOSIS — I129 Hypertensive chronic kidney disease with stage 1 through stage 4 chronic kidney disease, or unspecified chronic kidney disease: Secondary | ICD-10-CM | POA: Diagnosis not present

## 2020-12-21 NOTE — Progress Notes (Signed)
Remote pacemaker transmission.   

## 2021-02-25 DIAGNOSIS — N400 Enlarged prostate without lower urinary tract symptoms: Secondary | ICD-10-CM | POA: Diagnosis not present

## 2021-02-25 DIAGNOSIS — N183 Chronic kidney disease, stage 3 unspecified: Secondary | ICD-10-CM | POA: Diagnosis not present

## 2021-02-25 DIAGNOSIS — I2510 Atherosclerotic heart disease of native coronary artery without angina pectoris: Secondary | ICD-10-CM | POA: Diagnosis not present

## 2021-02-25 DIAGNOSIS — J449 Chronic obstructive pulmonary disease, unspecified: Secondary | ICD-10-CM | POA: Diagnosis not present

## 2021-02-25 DIAGNOSIS — E7800 Pure hypercholesterolemia, unspecified: Secondary | ICD-10-CM | POA: Diagnosis not present

## 2021-02-25 DIAGNOSIS — I129 Hypertensive chronic kidney disease with stage 1 through stage 4 chronic kidney disease, or unspecified chronic kidney disease: Secondary | ICD-10-CM | POA: Diagnosis not present

## 2021-02-25 DIAGNOSIS — Z23 Encounter for immunization: Secondary | ICD-10-CM | POA: Diagnosis not present

## 2021-03-05 ENCOUNTER — Ambulatory Visit (INDEPENDENT_AMBULATORY_CARE_PROVIDER_SITE_OTHER): Payer: Medicare Other

## 2021-03-05 DIAGNOSIS — I453 Trifascicular block: Secondary | ICD-10-CM | POA: Diagnosis not present

## 2021-03-05 LAB — CUP PACEART REMOTE DEVICE CHECK
Battery Remaining Longevity: 124 mo
Battery Voltage: 3.03 V
Brady Statistic AP VP Percent: 89.11 %
Brady Statistic AP VS Percent: 0.02 %
Brady Statistic AS VP Percent: 10.78 %
Brady Statistic AS VS Percent: 0.08 %
Brady Statistic RA Percent Paced: 89.12 %
Brady Statistic RV Percent Paced: 99.89 %
Date Time Interrogation Session: 20221004055048
Implantable Lead Implant Date: 20211001
Implantable Lead Implant Date: 20211001
Implantable Lead Location: 753859
Implantable Lead Location: 753860
Implantable Lead Model: 5076
Implantable Lead Model: 5076
Implantable Pulse Generator Implant Date: 20211001
Lead Channel Impedance Value: 304 Ohm
Lead Channel Impedance Value: 399 Ohm
Lead Channel Impedance Value: 437 Ohm
Lead Channel Impedance Value: 456 Ohm
Lead Channel Pacing Threshold Amplitude: 0.5 V
Lead Channel Pacing Threshold Amplitude: 0.875 V
Lead Channel Pacing Threshold Pulse Width: 0.4 ms
Lead Channel Pacing Threshold Pulse Width: 0.4 ms
Lead Channel Sensing Intrinsic Amplitude: 0.625 mV
Lead Channel Sensing Intrinsic Amplitude: 0.625 mV
Lead Channel Sensing Intrinsic Amplitude: 2.625 mV
Lead Channel Sensing Intrinsic Amplitude: 2.625 mV
Lead Channel Setting Pacing Amplitude: 2 V
Lead Channel Setting Pacing Amplitude: 2 V
Lead Channel Setting Pacing Pulse Width: 0.4 ms
Lead Channel Setting Sensing Sensitivity: 0.9 mV

## 2021-03-12 DIAGNOSIS — I129 Hypertensive chronic kidney disease with stage 1 through stage 4 chronic kidney disease, or unspecified chronic kidney disease: Secondary | ICD-10-CM | POA: Diagnosis not present

## 2021-03-12 DIAGNOSIS — N183 Chronic kidney disease, stage 3 unspecified: Secondary | ICD-10-CM | POA: Diagnosis not present

## 2021-03-12 DIAGNOSIS — R7303 Prediabetes: Secondary | ICD-10-CM | POA: Diagnosis not present

## 2021-03-12 DIAGNOSIS — E7800 Pure hypercholesterolemia, unspecified: Secondary | ICD-10-CM | POA: Diagnosis not present

## 2021-03-12 DIAGNOSIS — E663 Overweight: Secondary | ICD-10-CM | POA: Diagnosis not present

## 2021-03-13 NOTE — Progress Notes (Signed)
Remote pacemaker transmission.   

## 2021-03-29 ENCOUNTER — Ambulatory Visit (INDEPENDENT_AMBULATORY_CARE_PROVIDER_SITE_OTHER): Payer: Medicare Other | Admitting: Pulmonary Disease

## 2021-03-29 ENCOUNTER — Encounter: Payer: Self-pay | Admitting: Pulmonary Disease

## 2021-03-29 ENCOUNTER — Other Ambulatory Visit: Payer: Self-pay

## 2021-03-29 VITALS — BP 118/72 | HR 82 | Ht 72.0 in | Wt 214.0 lb

## 2021-03-29 DIAGNOSIS — I2510 Atherosclerotic heart disease of native coronary artery without angina pectoris: Secondary | ICD-10-CM | POA: Diagnosis not present

## 2021-03-29 DIAGNOSIS — J449 Chronic obstructive pulmonary disease, unspecified: Secondary | ICD-10-CM | POA: Diagnosis not present

## 2021-03-29 MED ORDER — DULERA 100-5 MCG/ACT IN AERO: 2.0000 | INHALATION_SPRAY | Freq: Two times a day (BID) | RESPIRATORY_TRACT | 6 refills | Status: DC

## 2021-03-29 NOTE — Patient Instructions (Signed)
I am glad you are doing well with your breathing We will renew the [facility] taking Prilosec over-the-counter and Tums for heartburn Follow-up and 1 year.

## 2021-03-29 NOTE — Progress Notes (Signed)
[name] [name]    [id number]    1940-11-28  Primary Care Physician:Miller, Misty Stanley, MD  Referring Physician: Sigmund Hazel, MD [address] [place],  [place] [address]  Chief complaint:   Follow up for COPD GOLD B  HPI: [name] [name] is a 80 year old with past medical history of COPD (CAT score 15, 0 exacerbations over past year, FEV1 30%), hypertension, heavy smoking in the past [quit in 1987]  He was seen by Dr. Clarene Duke at the primary care office in Dec 2016 for an exacerbation and given a prednisone taper starting at 60 mg for 12 days and started on Dulera inhaler. He reports that his symptoms have improved and feels close to his baseline.  Tried on Anoro inhaler in 2019.  He switch back to [facility] since he felt that the new inhaler did not work well for him   Interim History: Underwent total knee replacement earlier in 2022 without any respiratory issues Continues on Dulera Complains of mild chest tightness and heartburn symptoms which is intermittent in nature.  He is taking Tums over-the-counter  Outpatient Encounter Medications as of 03/29/2021  Medication Sig   atorvastatin (LIPITOR) 10 MG tablet Take 10 mg by mouth daily.   calcium carbonate (TUMS - DOSED IN MG ELEMENTAL CALCIUM) 500 MG chewable tablet Chew 1 tablet by mouth 2 (two) times daily as needed for indigestion or heartburn.   losartan (COZAAR) 100 MG tablet Take 100 mg by mouth daily.   mometasone-formoterol (DULERA) 100-5 MCG/ACT AERO Inhale 2 puffs into the lungs in the morning and at bedtime.   trimethoprim (TRIMPEX) 100 MG tablet Take 100 mg by mouth daily.   [DISCONTINUED] aspirin EC 81 MG tablet Take 1 tablet (81 mg total) by mouth 2 (two) times daily.   [DISCONTINUED] oxyCODONE-acetaminophen (PERCOCET/ROXICET) 5-325 MG tablet Take 1 tablet by mouth every 4 (four) hours as needed for severe pain.   [DISCONTINUED] tiZANidine (ZANAFLEX) 2 MG tablet Take 1 tablet (2 mg total) by mouth every 6 (six)  hours as needed.   No facility-administered encounter medications on file as of 03/29/2021.   Physical Exam: Blood pressure 118/72, pulse 82, height 6' (1.829 m), weight 214 lb (97.1 kg), SpO2 97 %. Gen:      No acute distress HEENT:  EOMI, sclera anicteric Neck:     No masses; no thyromegaly Lungs:    Clear to auscultation bilaterally; normal respiratory effort CV:         Regular rate and rhythm; no murmurs Abd:      + bowel sounds; soft, non-tender; no palpable masses, no distension Ext:    No edema; adequate peripheral perfusion Skin:      Warm and dry; no rash Neuro: alert and oriented x 3 Psych: normal mood and affect   Data Reviewed: PFTs  05/23/15 FVC 3.59 (78%), FEV1 1.08 (30%), F/F 38 Severe obstructive lung disease.  08/11/2017 FVC 4.72 [105%], FEV1 2.92 [90%], F/F 62, TLC 142%, DLCO 70% Mild obstructive airway disease with air trapping, hyperinflation, reversibility.  12/01/2019 FVC 4.74 [107%], FEV1 3.03 [94%], F/F 64, TLC 9.46 [126%), DLCO 23.57 [90%] Mild obstructive airway disease with air trapping and hyperinflation.  Mildly improved compared to 2019  Labs A1AT levels 12/20/15- 109, PIMM phenotype CBC 08/11/2017-WBC 10.7, eos 1.7%, absolute eosinophil count 182 Blood allergy profile 08/11/2017-IgE 154, RAST panel negative   Imaging Chest x-ray 07/03/16-  hyperinflated lung, mild left base  atelectasis/scarring Chest x-ray 07/13/2017- hyperinflated lungs CT abdomen pelvis 07/24/2017- mild scarring in the bases.  No evidence of interstitial lung disease. Chest x-ray 10/10/2019-no acute cardiopulmonary disease Chest x-ray 10/11/2020-mild bibasilar atelectasis I have reviewed the images personally  Assessment:  COPD GOLD B, asthma Continues on Dulera which is working well for him Pulmonary rehab discussed but deferred for now as he prefers to avoid due to Covid  GERD Complains of mild heartburn symptoms Advised over-the-counter Prilosec and Tums  Health  maintenance 08/19/2013-Prevnar 06/02/2013-Pneumovax Vaccinated with Covid   Plan/Recommendations: -Continue Dulera  Follow-up in 1 year  Chilton Greathouse MD [facility] 03/29/2021, 9:33 AM  CC: Sigmund Hazel, MD

## 2021-03-29 NOTE — Addendum Note (Signed)
Addended by: Maurene Capes on: 03/29/2021 09:44 AM   Modules accepted: Orders

## 2021-04-24 DIAGNOSIS — N1330 Unspecified hydronephrosis: Secondary | ICD-10-CM | POA: Diagnosis not present

## 2021-04-24 DIAGNOSIS — N400 Enlarged prostate without lower urinary tract symptoms: Secondary | ICD-10-CM | POA: Diagnosis not present

## 2021-05-14 DIAGNOSIS — L989 Disorder of the skin and subcutaneous tissue, unspecified: Secondary | ICD-10-CM | POA: Diagnosis not present

## 2021-07-29 DIAGNOSIS — R059 Cough, unspecified: Secondary | ICD-10-CM | POA: Diagnosis not present

## 2021-07-29 DIAGNOSIS — J449 Chronic obstructive pulmonary disease, unspecified: Secondary | ICD-10-CM | POA: Diagnosis not present

## 2021-08-24 IMAGING — CR DG CHEST 2V
2 series · 2 of 2 positions shown · non-contrast
Comparison: 03/02/2020

CLINICAL DATA: Chest pain for 2-3 weeks

EXAM:
CHEST - 2 VIEW

[chest pa]
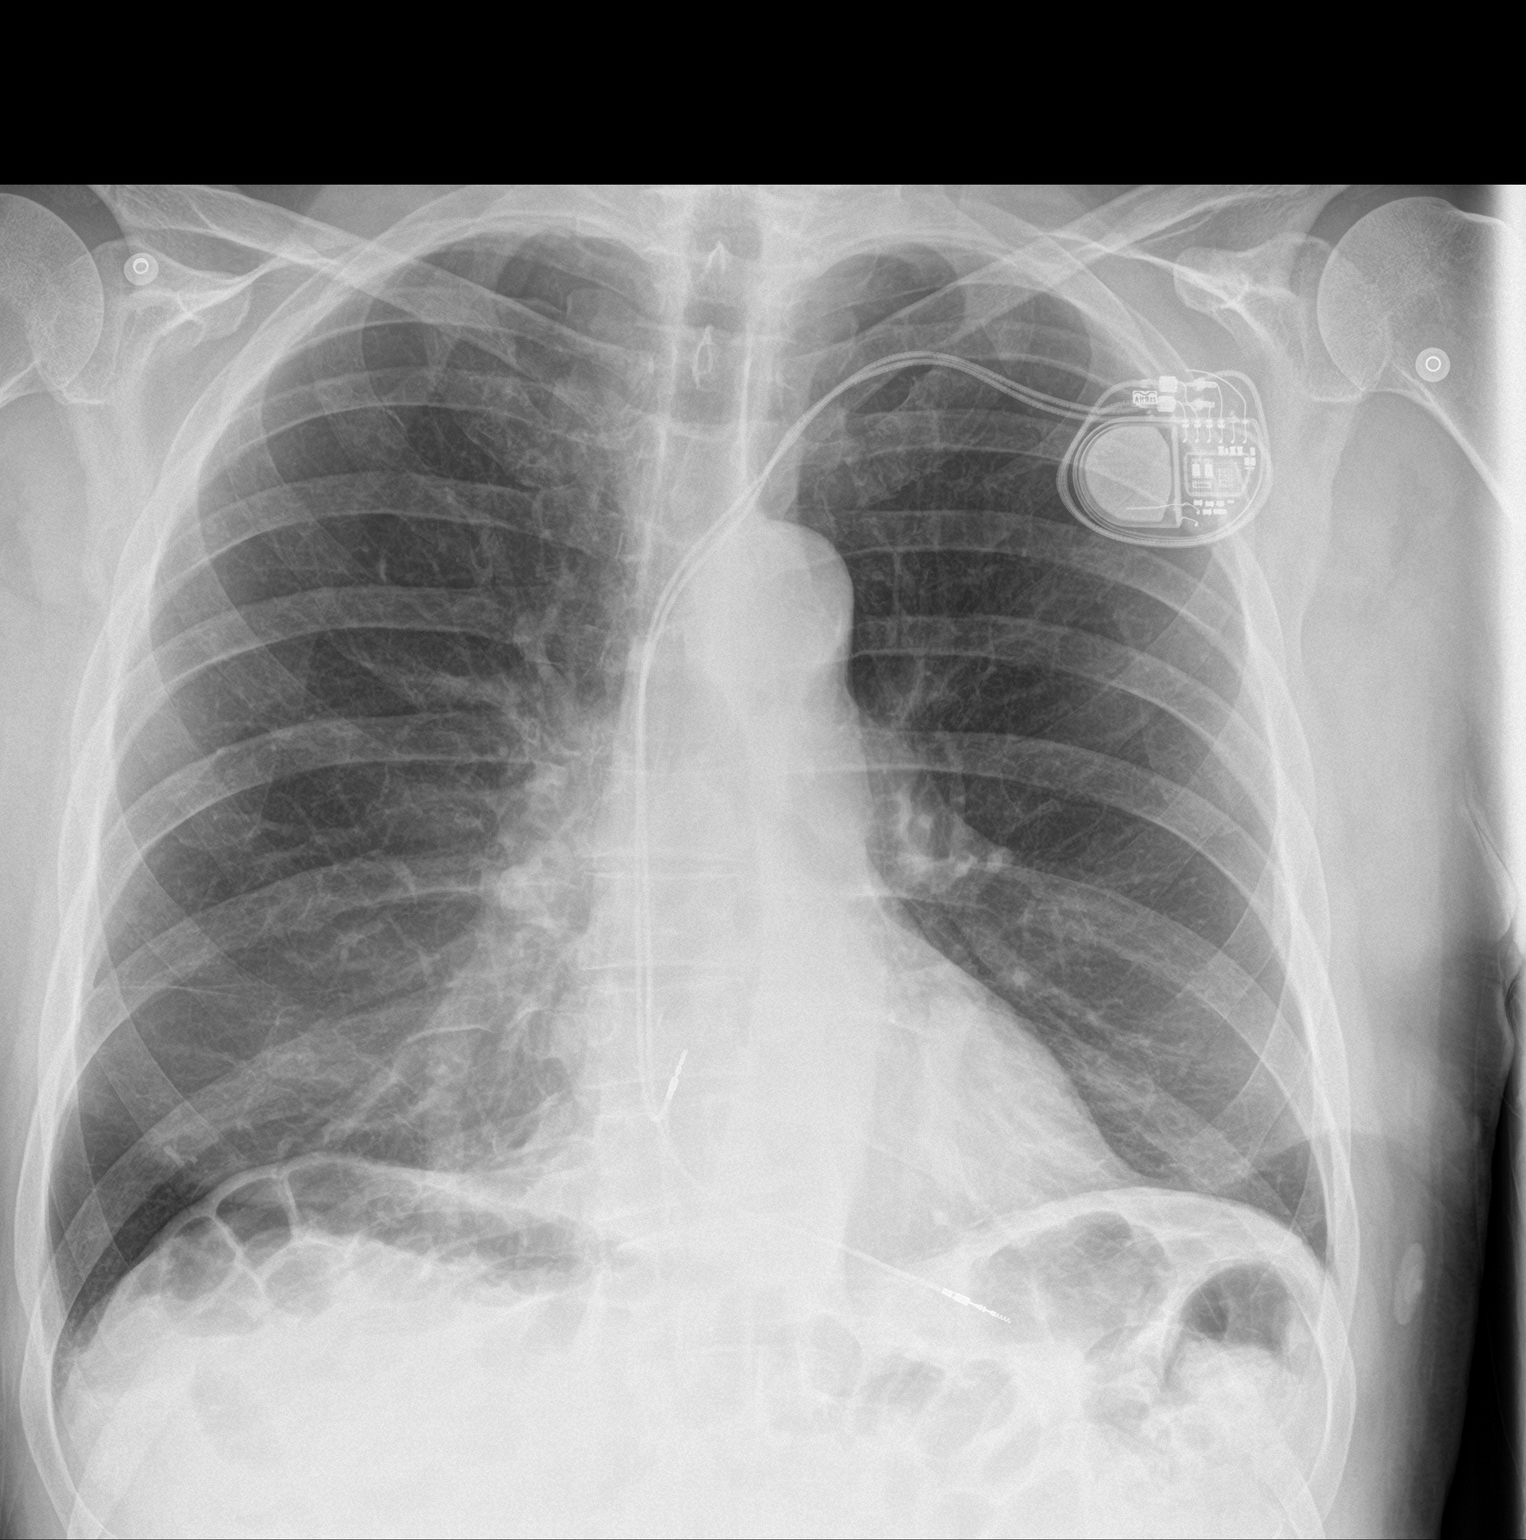

[chest lat]
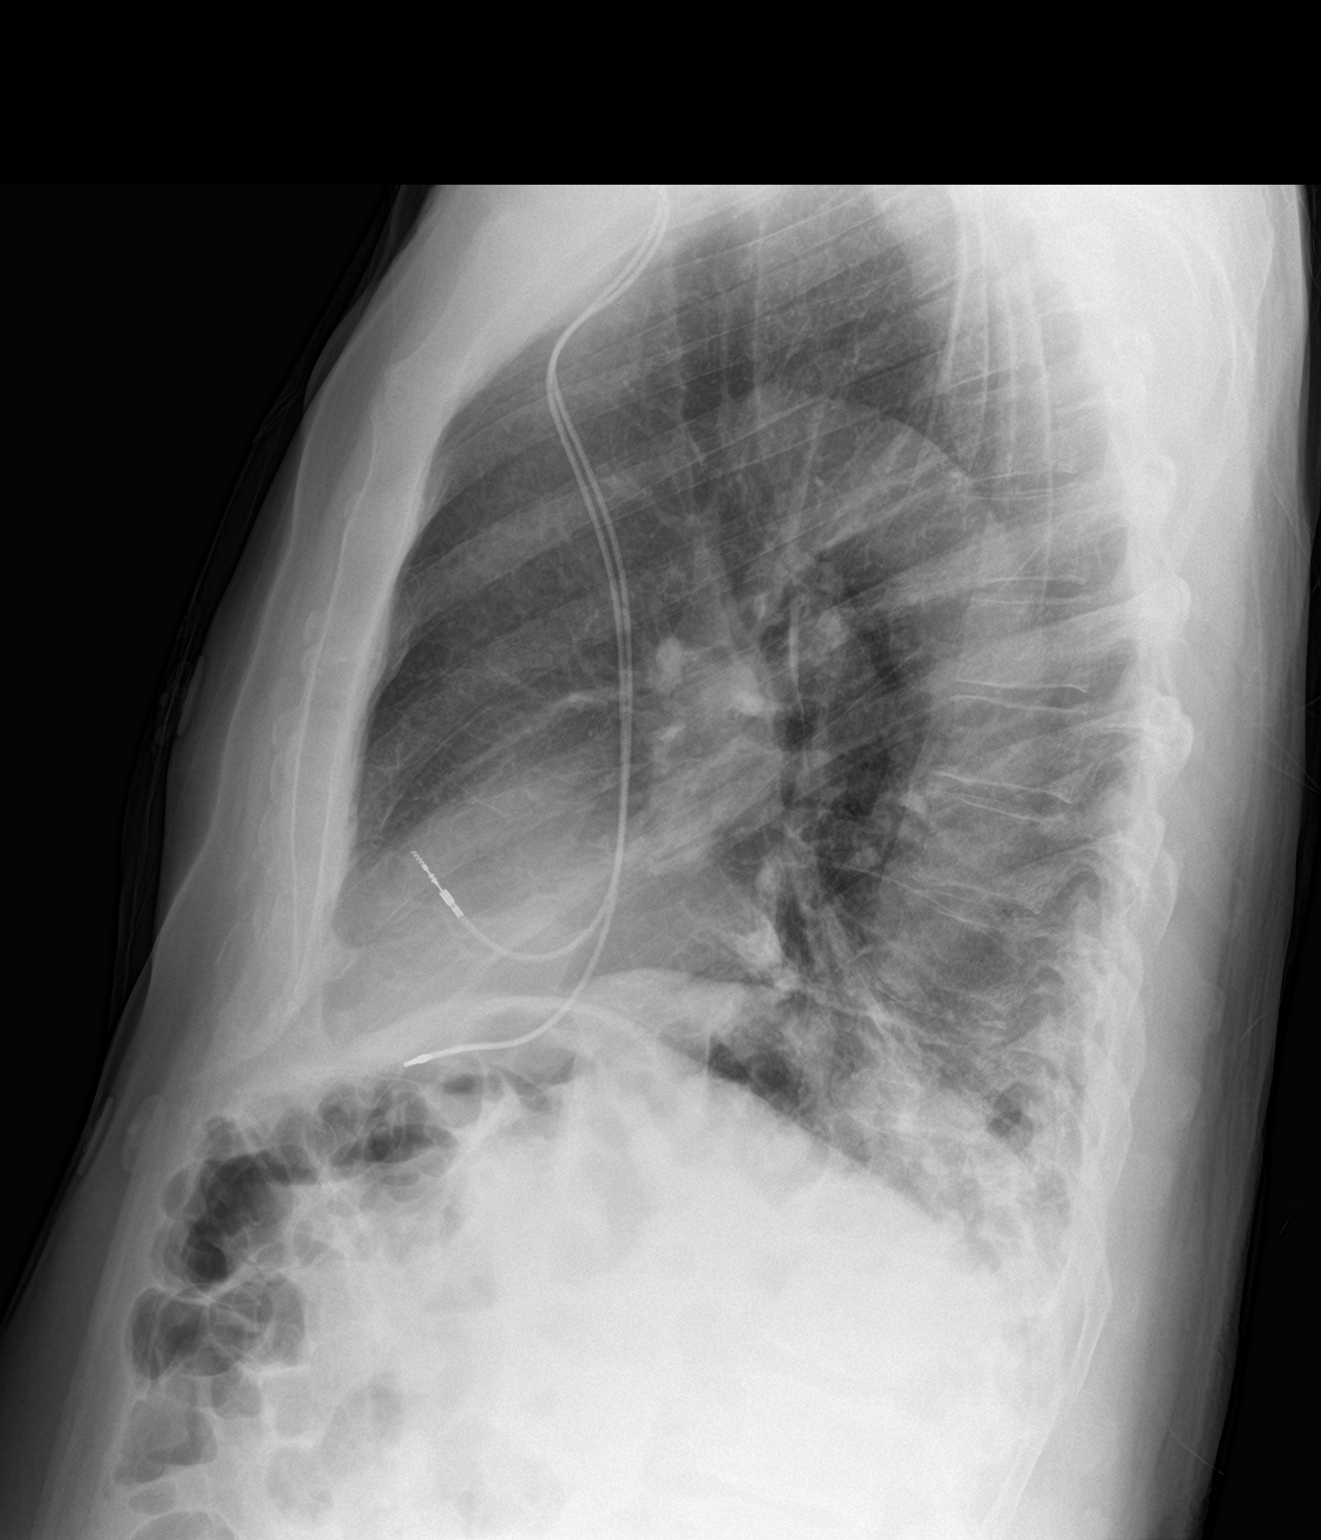

[2 of 2 positions shown; findings below may reference images not displayed]

FINDINGS: Cardiac shadow is within normal limits. Pacing device is again seen
and stable. The lungs are well aerated bilaterally. Mild atelectatic
changes are noted posteriorly projecting in the right lower lobe. No
effusion or pneumothorax is seen. No bony abnormality is noted.
IMPRESSION: Mild right basilar atelectasis.

## 2021-09-03 ENCOUNTER — Ambulatory Visit (INDEPENDENT_AMBULATORY_CARE_PROVIDER_SITE_OTHER): Payer: Medicare Other

## 2021-09-03 DIAGNOSIS — R55 Syncope and collapse: Secondary | ICD-10-CM | POA: Diagnosis not present

## 2021-09-04 LAB — CUP PACEART REMOTE DEVICE CHECK
Battery Remaining Longevity: 122 mo
Battery Voltage: 3.02 V
Brady Statistic AP VP Percent: 86.02 %
Brady Statistic AP VS Percent: 0.02 %
Brady Statistic AS VP Percent: 13.84 %
Brady Statistic AS VS Percent: 0.12 %
Brady Statistic RA Percent Paced: 86.03 %
Brady Statistic RV Percent Paced: 99.86 %
Date Time Interrogation Session: 20230404043809
Implantable Lead Implant Date: 20211001
Implantable Lead Implant Date: 20211001
Implantable Lead Location: 753859
Implantable Lead Location: 753860
Implantable Lead Model: 5076
Implantable Lead Model: 5076
Implantable Pulse Generator Implant Date: 20211001
Lead Channel Impedance Value: 304 Ohm
Lead Channel Impedance Value: 399 Ohm
Lead Channel Impedance Value: 456 Ohm
Lead Channel Impedance Value: 494 Ohm
Lead Channel Pacing Threshold Amplitude: 0.5 V
Lead Channel Pacing Threshold Amplitude: 0.875 V
Lead Channel Pacing Threshold Pulse Width: 0.4 ms
Lead Channel Pacing Threshold Pulse Width: 0.4 ms
Lead Channel Sensing Intrinsic Amplitude: 0.75 mV
Lead Channel Sensing Intrinsic Amplitude: 0.75 mV
Lead Channel Sensing Intrinsic Amplitude: 2.75 mV
Lead Channel Sensing Intrinsic Amplitude: 2.75 mV
Lead Channel Setting Pacing Amplitude: 1.75 V
Lead Channel Setting Pacing Amplitude: 2 V
Lead Channel Setting Pacing Pulse Width: 0.4 ms
Lead Channel Setting Sensing Sensitivity: 0.9 mV

## 2021-09-10 NOTE — Progress Notes (Deleted)
?Electrophysiology Office Follow up Visit Note:   ? ?Date:  09/10/2021  ? ?ID:  [name] [name], DOB [birth year], MRN [id number] ? ?PCP:  [name], MD  ?[facility] Cardiologist:  Evalina Field, MD  ?[facility] Electrophysiologist:  Vickie Epley, MD  ? ? ?Interval History:   ? ?[name] [name] is a 81 y.o. male who presents for a follow up visit. They were last seen in clinic June 06, 2020.  He had a permanent pacemaker implanted March 02, 2020.  Since implant he has done well.  Remote monitoring has shown stable device function.  He is paced 99% of the time. ? ? ? ?  ? ?Past Medical History:  ?Diagnosis Date  ? Arthritis   ? Chronic kidney disease   ? high creatinine level   ? Colon polyp March 2010  ? colonoscopy with Dr Oletta Lamas  ? COPD (chronic obstructive pulmonary disease) ([place])   ? mild   ? Elevated fasting glucose   ? Hepatitis   ? History of BPH   ? with obstruction and hydronephrosis and slihtly elevated creatinine, using self catherization, followed by urologist Dr McDiarmid  ? History of ETT 2012  ? [name]  ? History of tobacco use   ? 54 pack yr  ? HTN (hypertension)   ? Paroxysmal supraventricular tachycardia ([place])   ? Presence of permanent cardiac pacemaker   ? Screening for AAA (abdominal aortic aneurysm) 3/16  ? 3cm, nl/early aneurysm, repeat 3 yrs with u/s 3/19  ? ? ?Past Surgical History:  ?Procedure Laterality Date  ? APPENDECTOMY    ? LEFT HEART CATH AND CORONARY ANGIOGRAPHY N/A 08/03/2020  ? Procedure: LEFT HEART CATH AND CORONARY ANGIOGRAPHY;  Surgeon: Burnell Blanks, MD;  Location: [place] [facility];  Service: Cardiovascular;  Laterality: N/A;  ? PACEMAKER IMPLANT N/A 03/02/2020  ? Procedure: PACEMAKER IMPLANT;  Surgeon: Vickie Epley, MD;  Location: [place] [facility];  Service: Cardiovascular;  Laterality: N/A;  ? REPLACEMENT TOTAL KNEE BILATERAL  [id number]  ? TOTAL KNEE REVISION Left 10/15/2020  ? Procedure: REVISION LEFT KNEE REPLACEMENT BEARING;  Surgeon:  Frederik Pear, MD;  Location: [facility];  Service: Orthopedics;  Laterality: Left;  ? TRANSURETHRAL RESECTION OF PROSTATE  2010  ? ? ?Current Medications: ?No outpatient medications have been marked as taking for the 09/11/21 encounter (Appointment) with Vickie Epley, MD.  ?  ? ?Allergies:   Nsaids  ? ?Social History  ? ?Socioeconomic History  ? Marital status: Married  ?  Spouse name: Not on file  ? Number of children: Not on file  ? Years of education: Not on file  ? Highest education level: Not on file  ?Occupational History  ? Occupation: retired  ?Tobacco Use  ? Smoking status: Former  ?  Packs/day: 3.00  ?  Years: 30.00  ?  Pack years: 90.00  ?  Types: Cigarettes  ?  Quit date: 06/02/1985  ?  Years since quitting: 36.2  ? Smokeless tobacco: Never  ?Vaping Use  ? Vaping Use: Never used  ?Substance and Sexual Activity  ? Alcohol use: Yes  ?  Alcohol/week: 0.0 standard drinks  ?  Comment: daily wine, occasional vodka  ? Drug use: Never  ? Sexual activity: Not on file  ?Other Topics Concern  ? Not on file  ?Social History Narrative  ? Caffeine: yes  ? No diet  ? Exercise: yes  ? Married to [name] [name]  ? 2 children  ?  Retired - Advertising account executive, with extensive traveling  ? ?Social Determinants of Health  ? ?Financial Resource Strain: Not on file  ?Food Insecurity: Not on file  ?Transportation Needs: Not on file  ?Physical Activity: Not on file  ?Stress: Not on file  ?Social Connections: Not on file  ?  ? ?Family History: ?The patient's family history includes Healthy in his brother and brother; Kidney disease in his father. There is no history of Colon cancer or Colon polyps. ? ?ROS:   ?Please see the history of present illness.    ?All other systems reviewed and are negative. ? ?EKGs/Labs/Other Studies Reviewed:   ? ?The following studies were reviewed today: ? ?August 03, 2020 echo ?Left ventricular function normal, 60% ?Right ventricular function low normal ? ?EKG:  The ekg ordered today demonstrates *** ? ?Recent  Labs: ?10/11/2020: BUN 22; Creatinine, Ser 2.02; Hemoglobin 14.2; Platelets 172; Potassium 5.3; Sodium 135  ?Recent Lipid Panel ?   ?Component Value Date/Time  ? CHOL 106 08/03/2020 0853  ? TRIG 50 08/03/2020 0853  ? HDL 58 08/03/2020 0853  ? CHOLHDL 1.8 08/03/2020 0853  ? VLDL 10 08/03/2020 0853  ? [place]eves 38 08/03/2020 0853  ? ? ?Physical Exam:   ? ?VS:  There were no vitals taken for this visit.   ? ?Wt Readings from Last 3 Encounters:  ?03/29/21 214 lb (97.1 kg)  ?10/15/20 212 lb 1.3 oz (96.2 kg)  ?10/11/20 212 lb (96.2 kg)  ?  ? ?GEN: *** Well nourished, well developed in no acute distress ?HEENT: Normal ?NECK: No JVD; No carotid bruits ?LYMPHATICS: No lymphadenopathy ?CARDIAC: ***RRR, no murmurs, rubs, gallops ?RESPIRATORY:  Clear to auscultation without rales, wheezing or rhonchi  ?ABDOMEN: Soft, non-tender, non-distended ?MUSCULOSKELETAL:  No edema; No deformity  ?SKIN: Warm and dry ?NEUROLOGIC:  Alert and oriented x 3 ?PSYCHIATRIC:  Normal affect  ? ? ? ?  ? ?ASSESSMENT:   ? ?No diagnosis found. ?PLAN:   ? ?In order of problems listed above: ? ? ? ?Repeat echo given high pacing burden ? ? ? ? ? ? ?Total time spent with patient today *** minutes. This includes reviewing records, evaluating the patient and coordinating care.  ? ?Medication Adjustments/Labs and Tests Ordered: ?Current medicines are reviewed at length with the patient today.  Concerns regarding medicines are outlined above.  ?No orders of the defined types were placed in this encounter. ? ?No orders of the defined types were placed in this encounter. ? ? ? ?Signed, ?Lars Mage, MD, [facility], [facility] ?09/10/2021 10:27 PM    ?Electrophysiology ?[place] ?

## 2021-09-11 ENCOUNTER — Encounter: Payer: Self-pay | Admitting: Cardiology

## 2021-09-11 ENCOUNTER — Ambulatory Visit (INDEPENDENT_AMBULATORY_CARE_PROVIDER_SITE_OTHER): Payer: Medicare Other | Admitting: Cardiology

## 2021-09-11 VITALS — BP 120/90 | HR 68 | Ht 73.0 in | Wt 209.0 lb

## 2021-09-11 DIAGNOSIS — I10 Essential (primary) hypertension: Secondary | ICD-10-CM | POA: Diagnosis not present

## 2021-09-11 DIAGNOSIS — R55 Syncope and collapse: Secondary | ICD-10-CM

## 2021-09-11 DIAGNOSIS — I453 Trifascicular block: Secondary | ICD-10-CM | POA: Diagnosis not present

## 2021-09-11 DIAGNOSIS — Z950 Presence of cardiac pacemaker: Secondary | ICD-10-CM

## 2021-09-11 NOTE — Progress Notes (Addendum)
?Electrophysiology Office Follow up Visit Note:   ? ?Date:  09/11/2021  ? ?ID:  [name] PilgrimLarry C Carruthers, DOB 06/22/1940, MRN [id number] ? ?PCP:  Sigmund HazelMiller, Lisa, MD  ?[facility] Cardiologist:  Reatha HarpsWesley T O'Neal, MD  ?[facility] Electrophysiologist:  Lanier PrudeAMERON T Terrisha Lopata, MD  ? ? ?Interval History:   ? ?[name] [name] is a 81 y.o. male who presents for a follow up visit. They were last seen in clinic June 06, 2020.  He had a permanent pacemaker implanted March 02, 2020.  Since implant he has done well.  Remote monitoring has shown stable device function.  He is paced 99% of the time. ? ?Today, he is feeling good overall. He is accompanied by a family member. ? ?Lately he has noticed left arm pain while he swims, which he attributes to his device. ? ?He denies any palpitations, chest pain, shortness of breath, or peripheral edema. No lightheadedness, headaches, syncope, orthopnea, or PND. ? ?[place] plan to [place] to [place] in [place] for a 3 week vacation. ? ? ? ?  ? ?Past Medical History:  ?Diagnosis Date  ? Arthritis   ? Chronic kidney disease   ? high creatinine level   ? Colon polyp March 2010  ? colonoscopy with Dr Randa EvensEdwards  ? COPD (chronic obstructive pulmonary disease) ([facility])   ? mild   ? Elevated fasting glucose   ? Hepatitis   ? History of BPH   ? with obstruction and hydronephrosis and slihtly elevated creatinine, using self catherization, followed by urologist Dr McDiarmid  ? History of ETT 2012  ? [name]  ? History of tobacco use   ? 54 pack yr  ? HTN (hypertension)   ? Paroxysmal supraventricular tachycardia ([facility])   ? Presence of permanent cardiac pacemaker   ? Screening for AAA (abdominal aortic aneurysm) 3/16  ? 3cm, nl/early aneurysm, repeat 3 yrs with u/s 3/19  ? ? ?Past Surgical History:  ?Procedure Laterality Date  ? APPENDECTOMY    ? LEFT HEART CATH AND CORONARY ANGIOGRAPHY N/A 08/03/2020  ? Procedure: LEFT HEART CATH AND CORONARY ANGIOGRAPHY;  Surgeon: Kathleene HazelMcAlhany, Christopher D, MD;  Location: [facility];  Service: Cardiovascular;  Laterality: N/A;  ? PACEMAKER IMPLANT N/A 03/02/2020  ? Procedure: PACEMAKER IMPLANT;  Surgeon: Lanier PrudeLambert, Lamerle Jabs T, MD;  [facility];  Service: Cardiovascular;  Laterality: N/A;  ? REPLACEMENT TOTAL KNEE BILATERAL  [id number]  ? TOTAL KNEE REVISION Left 10/15/2020  ? Procedure: REVISION LEFT KNEE REPLACEMENT BEARING;  Surgeon: Gean Birchwoodowan, Frank, MD;  Location: [facility];  Service: Orthopedics;  Laterality: Left;  ? TRANSURETHRAL RESECTION OF PROSTATE  2010  ? ? ?Current Medications: ?Current Meds  ?Medication Sig  ? atorvastatin (LIPITOR) 10 MG tablet Take 10 mg by mouth daily.  ? losartan (COZAAR) 100 MG tablet Take 100 mg by mouth daily.  ? mometasone-formoterol (DULERA) 100-5 MCG/ACT AERO Inhale 2 puffs into the lungs in the morning and at bedtime.  ? trimethoprim (TRIMPEX) 100 MG tablet Take 100 mg by mouth daily.  ?  ? ?Allergies:   Nsaids  ? ?Social History  ? ?Socioeconomic History  ? Marital status: Married  ?  Spouse name: Not on file  ? Number of children: Not on file  ? Years of education: Not on file  ? Highest education level: Not on file  ?Occupational History  ? Occupation: retired  ?Tobacco Use  ? Smoking status: Former  ?  Packs/day: 3.00  ?  Years: 30.00  ?  Pack years: 90.00  ?  Types: Cigarettes  ?  Quit date: 06/02/1985  ?  Years since quitting: 36.3  ? Smokeless tobacco: Never  ?Vaping Use  ? Vaping Use: Never used  ?Substance and Sexual Activity  ? Alcohol use: Yes  ?  Alcohol/week: 0.0 standard drinks  ?  Comment: daily wine, occasional vodka  ? Drug use: Never  ? Sexual activity: Not on file  ?Other Topics Concern  ? Not on file  ?Social History Narrative  ? Caffeine: yes  ? No diet  ? Exercise: yes  ? Married to [place]  ? 2 children  ? Retired - Education officer, environmental, with extensive traveling  ? ?Social Determinants of Health  ? ?Financial Resource Strain: Not on file  ?Food Insecurity: Not on file  ?Transportation Needs: Not on file  ?Physical  Activity: Not on file  ?Stress: Not on file  ?Social Connections: Not on file  ?  ? ?Family History: ?The patient's family history includes Healthy in his brother and brother; Kidney disease in his father. There is no history of Colon cancer or Colon polyps. ? ?ROS:   ?Please see the history of present illness.    ?(+) LUE pain ?All other systems reviewed and are negative. ? ?EKGs/Labs/Other Studies Reviewed:   ? ?The following studies were reviewed today: ? ?September 11, 2021 in clinic device interrogation personally reviewed shows stable lead parameters and normal device function.  100% RV paced.  Reprogrammed blanking to minimize far field. ? ?August 03, 2020 echo ?Left ventricular function normal, 60% ?Right ventricular function low normal ? ?August 03, 2020, Left Heart Cath ?Mid Cx lesion is 20% stenosed. ?Mid LAD lesion is 20% stenosed. ?Dist LAD lesion is 20% stenosed. ?  ?1. Mild non-obstructive CAD ?2. Normal LV filling pressure ?3. Non-cardiac chest pain ?  ?Recommendations: Medical management of mild non-obstructive CAD ? ?March 02, 2020, Pacemaker Implant ? CONCLUSIONS:  ? 1. High degree AV block and syncope ? 2. Successful dual chamber permanent pacemaker implantation ? 3.  No early apparent complications.  ? ?EKG:  EKG is personally reviewed. ?09/11/2021: RV pacing ?08/16/2020 (Dr. Flora Lipps): AV paced ? ?Recent Labs: ?10/11/2020: BUN 22; Creatinine, Ser 2.02; Hemoglobin 14.2; Platelets 172; Potassium 5.3; Sodium 135  ?Recent Lipid Panel ?   ?Component Value Date/Time  ? CHOL 106 08/03/2020 0853  ? TRIG 50 08/03/2020 0853  ? HDL 58 08/03/2020 0853  ? CHOLHDL 1.8 08/03/2020 0853  ? VLDL 10 08/03/2020 0853  ? LDLCALC 38 08/03/2020 0853  ? ? ?Physical Exam:   ? ?VS:  BP 120/90   Pulse 68   Ht 6\' 1"  (1.854 m)   Wt 209 lb (94.8 kg)   SpO2 94%   BMI 27.57 kg/m?    ? ?Wt Readings from Last 3 Encounters:  ?09/11/21 209 lb (94.8 kg)  ?03/29/21 214 lb (97.1 kg)  ?10/15/20 212 lb 1.3 oz (96.2 kg)  ?  ? ?GEN: Well  nourished, well developed in no acute distress ?HEENT: Normal ?NECK: No JVD; No carotid bruits ?LYMPHATICS: No lymphadenopathy ?CARDIAC: RRR, no murmurs, rubs, gallops; Device pocket well healed. ?RESPIRATORY:  Clear to auscultation without rales, wheezing or rhonchi  ?ABDOMEN: Soft, non-tender, non-distended ?MUSCULOSKELETAL:  No edema; No deformity  ?SKIN: Warm and dry ?NEUROLOGIC:  Alert and oriented x 3 ?PSYCHIATRIC:  Normal affect  ? ? ? ?  ? ?ASSESSMENT:   ? ?1. Pacemaker   ?2. Syncope and collapse   ?3. Trifascicular block   ?4.  Primary hypertension   ? ?PLAN:   ? ?In order of problems listed above: ? ?#Pacemaker ?#Syncope ?#Complete heart block ? ?Device functioning appropriately.  Continue remote monitoring.  Given high pacing burden we will repeat echocardiogram to ensure normal LV function in setting of chronic RV pacing. ? ?Follow-up 1 year with APP. ? ? ?Medication Adjustments/Labs and Tests Ordered: ?Current medicines are reviewed at length with the patient today.  Concerns regarding medicines are outlined above.  ?Orders Placed This Encounter  ?Procedures  ? EKG 12-Lead  ? ECHOCARDIOGRAM COMPLETE  ? ?No orders of the defined types were placed in this encounter. ? ?I,Mathew Stumpf,acting as a Neurosurgeon for Lanier Prude, MD.,have documented all relevant documentation on the behalf of Lanier Prude, MD,as directed by  Lanier Prude, MD while in the presence of Lanier Prude, MD. ? ?I, Lanier Prude, MD, have reviewed all documentation for this visit. The documentation on 09/11/21 for the exam, diagnosis, procedures, and orders are all accurate and complete. ? ? ?Signed, ?Steffanie Dunn, MD, [facility], [facility] ?09/11/2021 2:22 PM    ?Electrophysiology ?[facility] ?

## 2021-09-11 NOTE — Patient Instructions (Signed)
Medication Instructions:  Your physician recommends that you continue on your current medications as directed. Please refer to the Current Medication list given to you today. *If you need a refill on your cardiac medications before your next appointment, please call your pharmacy*  Lab Work: None. If you have labs (blood work) drawn today and your tests are completely normal, you will receive your results only by: MyChart Message (if you have MyChart) OR A paper copy in the mail If you have any lab test that is abnormal or we need to change your treatment, we will call you to review the results.  Testing/Procedures: Your physician has requested that you have an echocardiogram. Echocardiography is a painless test that uses sound waves to create images of your heart. It provides your doctor with information about the size and shape of your heart and how well your heart's chambers and valves are working. This procedure takes approximately one hour. There are no restrictions for this procedure.   Follow-Up: At [facility], you and your health needs are our priority.  As part of our continuing mission to provide you with exceptional heart care, we have created designated Provider Care Teams.  These Care Teams include your primary Cardiologist (physician) and Advanced Practice Providers (APPs -  Physician Assistants and Nurse Practitioners) who all work together to provide you with the care you need, when you need it.  Your physician wants you to follow-up in: 12 months with    one of the following Advanced Practice Providers on your designated Care Team:    Renee Ursuy, PA-C Michael "Andy" Tillery, PA-C   You will receive a reminder letter in the mail two months in advance. If you don't receive a letter, please call our office to schedule the follow-up appointment.  We recommend signing up for the patient portal called "MyChart".  Sign up information is provided on this After Visit Summary.   MyChart is used to connect with patients for Virtual Visits (Telemedicine).  Patients are able to view lab/test results, encounter notes, upcoming appointments, etc.  Non-urgent messages can be sent to your provider as well.   To learn more about what you can do with MyChart, go to [email or web address].    Any Other Special Instructions Will Be Listed Below (If Applicable).         

## 2021-09-16 ENCOUNTER — Ambulatory Visit (HOSPITAL_COMMUNITY): Payer: Medicare Other | Attending: Cardiology

## 2021-09-16 DIAGNOSIS — I453 Trifascicular block: Secondary | ICD-10-CM | POA: Diagnosis not present

## 2021-09-16 DIAGNOSIS — R55 Syncope and collapse: Secondary | ICD-10-CM | POA: Diagnosis not present

## 2021-09-16 DIAGNOSIS — Z950 Presence of cardiac pacemaker: Secondary | ICD-10-CM | POA: Diagnosis not present

## 2021-09-16 DIAGNOSIS — I10 Essential (primary) hypertension: Secondary | ICD-10-CM | POA: Diagnosis not present

## 2021-09-16 LAB — ECHOCARDIOGRAM COMPLETE
Area-P 1/2: 8.16 cm2
S' Lateral: 3.8 cm

## 2021-09-17 DIAGNOSIS — E663 Overweight: Secondary | ICD-10-CM | POA: Diagnosis not present

## 2021-09-17 DIAGNOSIS — N400 Enlarged prostate without lower urinary tract symptoms: Secondary | ICD-10-CM | POA: Diagnosis not present

## 2021-09-17 DIAGNOSIS — Z950 Presence of cardiac pacemaker: Secondary | ICD-10-CM | POA: Diagnosis not present

## 2021-09-17 DIAGNOSIS — Z789 Other specified health status: Secondary | ICD-10-CM | POA: Diagnosis not present

## 2021-09-17 DIAGNOSIS — R7303 Prediabetes: Secondary | ICD-10-CM | POA: Diagnosis not present

## 2021-09-17 DIAGNOSIS — E7800 Pure hypercholesterolemia, unspecified: Secondary | ICD-10-CM | POA: Diagnosis not present

## 2021-09-17 DIAGNOSIS — I2510 Atherosclerotic heart disease of native coronary artery without angina pectoris: Secondary | ICD-10-CM | POA: Diagnosis not present

## 2021-09-17 DIAGNOSIS — I129 Hypertensive chronic kidney disease with stage 1 through stage 4 chronic kidney disease, or unspecified chronic kidney disease: Secondary | ICD-10-CM | POA: Diagnosis not present

## 2021-09-17 DIAGNOSIS — J449 Chronic obstructive pulmonary disease, unspecified: Secondary | ICD-10-CM | POA: Diagnosis not present

## 2021-09-17 DIAGNOSIS — N183 Chronic kidney disease, stage 3 unspecified: Secondary | ICD-10-CM | POA: Diagnosis not present

## 2021-09-18 NOTE — Progress Notes (Deleted)
Cardiology Office Note:   Date:  09/18/2021  NAME:  [name] [name]    MRN: [id number] DOB:  [birth year]   PCP:  [name], MD  Cardiologist:  Evalina Field, MD  Electrophysiologist:  Vickie Epley, MD   Referring MD: [name], MD   No chief complaint on file. ***  History of Present Illness:   [name] [name] is a 81 y.o. male with a hx of sick sinus syndrome status post pacemaker, nonobstructive CAD, hyperlipidemia, COPD who presents for follow-up.  Problem List 1. COPD -mild obstructive disease 2. HTN 3. Tobacco abuse  4. Bladder dysfunction -self catheterization (4-5 years) 5. 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 HLD -Total cholesterol 123, HDL 62, LDL 49, triglycerides 54 7. Non-obstructive CAD 08/03/2020 -20% LAD -20% LCX  Past Medical History: Past Medical History:  Diagnosis Date   Arthritis    Chronic kidney disease    high creatinine level    Colon polyp March 2010   colonoscopy with Dr Oletta Lamas   COPD (chronic obstructive pulmonary disease) ([place])    mild    Elevated fasting glucose    Hepatitis    History of BPH    with obstruction and hydronephrosis and slihtly elevated creatinine, using self catherization, followed by urologist Dr McDiarmid   History of ETT 2012   [name]   History of tobacco use    54 pack yr   HTN (hypertension)    Paroxysmal supraventricular tachycardia ([place])    Presence of permanent cardiac pacemaker    Screening for AAA (abdominal aortic aneurysm) 3/16   3cm, nl/early aneurysm, repeat 3 yrs with u/s 3/19    Past Surgical History: Past Surgical History:  Procedure Laterality Date   APPENDECTOMY     LEFT HEART CATH AND CORONARY ANGIOGRAPHY N/A 08/03/2020   Procedure: LEFT HEART CATH AND CORONARY ANGIOGRAPHY;  Surgeon: Burnell Blanks, MD;  Location: [facility];  Service: Cardiovascular;  Laterality: N/A;   PACEMAKER IMPLANT N/A 03/02/2020   Procedure: PACEMAKER IMPLANT;   Surgeon: Vickie Epley, MD;  Location: [facility];  Service: Cardiovascular;  Laterality: N/A;   REPLACEMENT TOTAL KNEE BILATERAL  [id number]   TOTAL KNEE REVISION Left 10/15/2020   Procedure: REVISION LEFT KNEE REPLACEMENT BEARING;  Surgeon: Frederik Pear, MD;  Location: [facility];  Service: Orthopedics;  Laterality: Left;   TRANSURETHRAL RESECTION OF PROSTATE  2010    Current Medications: No outpatient medications have been marked as taking for the 09/20/21 encounter (Appointment) with O'Neal, Cassie Freer, MD.     Allergies:    Nsaids   Social History: Social History   Socioeconomic History   Marital status: Married    Spouse name: Not on file   Number of children: Not on file   Years of education: Not on file   Highest education level: Not on file  Occupational History   Occupation: retired  Tobacco Use   Smoking status: Former    Packs/day: 3.00    Years: 30.00    Pack years: 90.00    Types: Cigarettes    Quit date: 06/02/1985    Years since quitting: 36.3   Smokeless tobacco: Never  Vaping Use   Vaping Use: Never used  Substance and Sexual Activity   Alcohol use: Yes    Alcohol/week: 0.0 standard drinks    Comment: daily wine, occasional vodka   Drug use: Never   Sexual activity: Not on file  Other  Topics Concern   Not on file  Social History Narrative   Caffeine: yes   No diet   Exercise: yes   Married to [place]   2 children   Retired - Advertising account executive, with extensive traveling   Social Determinants of Radio broadcast assistant Strain: Not on [employer] Insecurity: Not on file  Transportation Needs: Not on file  Physical Activity: Not on file  Stress: Not on file  Social Connections: Not on file     Family History: The patient's ***family history includes Healthy in his brother and brother; Kidney disease in his father. There is no history of Colon cancer or Colon polyps.  ROS:   All other ROS reviewed and negative. Pertinent  positives noted in the HPI.     EKGs/Labs/Other Studies Reviewed:   The following studies were personally reviewed by me today:  EKG:  EKG is *** ordered today.  The ekg ordered today demonstrates ***, and was personally reviewed by me.   TTE 09/16/2021   1. Left ventricular ejection fraction, by estimation, is 50 to 55%. The  left ventricle has low normal function. The left ventricle has no regional  wall motion abnormalities. Left ventricular diastolic parameters were  normal.   2. Right ventricular systolic function is normal. The right ventricular  size is normal. There is normal pulmonary artery systolic pressure. The  estimated right ventricular systolic pressure is 123456 mmHg.   3. The mitral valve is normal in structure. Mild mitral valve  regurgitation. No evidence of mitral stenosis.   4. The aortic valve was not well visualized. Aortic valve regurgitation  is trivial. No aortic stenosis is present.   5. The inferior vena cava is normal in size with greater than 50%  respiratory variability, suggesting right atrial pressure of 3 mmHg.   Recent Labs: 10/11/2020: BUN 22; Creatinine, Ser 2.02; Hemoglobin 14.2; Platelets 172; Potassium 5.3; Sodium 135   Recent Lipid Panel    Component Value Date/Time   CHOL 106 08/03/2020 0853   TRIG 50 08/03/2020 0853   HDL 58 08/03/2020 0853   CHOLHDL 1.8 08/03/2020 0853   VLDL 10 08/03/2020 0853   LDLCALC 38 08/03/2020 0853    Physical Exam:   VS:  There were no vitals taken for this visit.   Wt Readings from Last 3 Encounters:  09/11/21 209 lb (94.8 kg)  03/29/21 214 lb (97.1 kg)  10/15/20 212 lb 1.3 oz (96.2 kg)    General: Well nourished, well developed, in no acute distress Head: Atraumatic, normal size  Eyes: PEERLA, EOMI  Neck: Supple, no JVD Endocrine: No thryomegaly Cardiac: Normal S1, S2; RRR; no murmurs, rubs, or gallops Lungs: Clear to auscultation bilaterally, no wheezing, rhonchi or rales  Abd: Soft, nontender, no  hepatomegaly  Ext: No edema, pulses 2+ Musculoskeletal: No deformities, BUE and BLE strength normal and equal Skin: Warm and dry, no rashes   Neuro: Alert and oriented to person, place, time, and situation, CNII-XII grossly intact, no focal deficits  Psych: Normal mood and affect   ASSESSMENT:   [name] [name] is a 81 y.o. male who presents for the following: No diagnosis found.  PLAN:   There are no diagnoses linked to this encounter.  {Are you ordering a CV Procedure (e.g. stress test, cath, DCCV, TEE, etc)?   Press F2        :[id number]  Disposition: No follow-ups on file.  Medication Adjustments/Labs and Tests Ordered: Current medicines are reviewed at  length with the patient today.  Concerns regarding medicines are outlined above.  No orders of the defined types were placed in this encounter.  No orders of the defined types were placed in this encounter.   There are no Patient Instructions on file for this visit.   Time Spent with Patient: I have spent a total of *** minutes with patient reviewing hospital notes, telemetry, EKGs, labs and examining the patient as well as establishing an assessment and plan that was discussed with the patient.  > 50% of time was spent in direct patient care.  Signed, Addison Naegeli. Audie Box, MD, [facility]  [address] [place], [place] [address] [phone]  09/18/2021 9:57 AM

## 2021-09-18 NOTE — Progress Notes (Signed)
Remote pacemaker transmission.   

## 2021-09-20 ENCOUNTER — Ambulatory Visit: Payer: Medicare Other | Admitting: Cardiovascular Disease

## 2021-09-20 DIAGNOSIS — I2510 Atherosclerotic heart disease of native coronary artery without angina pectoris: Secondary | ICD-10-CM

## 2021-09-20 DIAGNOSIS — E782 Mixed hyperlipidemia: Secondary | ICD-10-CM

## 2021-09-20 DIAGNOSIS — I10 Essential (primary) hypertension: Secondary | ICD-10-CM

## 2021-09-20 DIAGNOSIS — Z950 Presence of cardiac pacemaker: Secondary | ICD-10-CM

## 2021-09-25 ENCOUNTER — Encounter: Payer: Self-pay | Admitting: Cardiovascular Disease

## 2021-10-11 ENCOUNTER — Ambulatory Visit: Payer: Medicare Other | Admitting: Cardiovascular Disease

## 2021-11-13 ENCOUNTER — Other Ambulatory Visit: Payer: Self-pay | Admitting: Pulmonary Disease

## 2021-11-13 DIAGNOSIS — J449 Chronic obstructive pulmonary disease, unspecified: Secondary | ICD-10-CM

## 2021-11-17 NOTE — Progress Notes (Unsigned)
Cardiology Office Note:   Date:  11/19/2021  NAME:  [name] [name]    MRN: [id number] DOB:  November 14, 1940   PCP:  Sigmund Hazel, MD  Cardiologist:  Reatha Harps, MD  Electrophysiologist:  Lanier Prude, MD   Referring MD: Sigmund Hazel, MD   Chief Complaint  Patient presents with   Follow-up    History of Present Illness:   [name] [name] is a 81 y.o. male with a hx of sick sinus syndrome status post pacemaker, nonobstructive CAD, COPD who presents for follow-up.  Reports he is doing well.  Most recent LDL cholesterol 51.  Denies any chest discomfort or trouble breathing.  He is treated for COPD and this seems to be stable.  Was having exertional chest discomfort that was likely COPD related.  All of his lab work is within limits.  Without new complaints today.  Interrogation of his device shows it is functioning normally.  Problem List 1. COPD -mild obstructive disease 2. HTN 3. Tobacco abuse  4. Bladder dysfunction -self catheterization (4-5 years) 5. 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 HLD -Total chol 120, HDL 57, LDL 51, TG 55 7. Non-obstructive CAD 08/03/2020 -20% LAD -20% LCX  Past Medical History: Past Medical History:  Diagnosis Date   Arthritis    Chronic kidney disease    high creatinine level    Colon polyp March 2010   colonoscopy with Dr Randa Evens   COPD (chronic obstructive pulmonary disease) ([facility])    mild    Elevated fasting glucose    Hepatitis    History of BPH    with obstruction and hydronephrosis and slihtly elevated creatinine, using self catherization, followed by urologist Dr McDiarmid   History of ETT 2012   [name]   History of tobacco use    54 pack yr   HTN (hypertension)    Paroxysmal supraventricular tachycardia ([facility])    Presence of permanent cardiac pacemaker    Screening for AAA (abdominal aortic aneurysm) 3/16   3cm, nl/early aneurysm, repeat 3 yrs with u/s 3/19    Past Surgical History: Past  Surgical History:  Procedure Laterality Date   APPENDECTOMY     LEFT HEART CATH AND CORONARY ANGIOGRAPHY N/A 08/03/2020   Procedure: LEFT HEART CATH AND CORONARY ANGIOGRAPHY;  Surgeon: Kathleene Hazel, MD;  Location: [facility];  Service: Cardiovascular;  Laterality: N/A;   PACEMAKER IMPLANT N/A 03/02/2020   Procedure: PACEMAKER IMPLANT;  Surgeon: Lanier Prude, MD;  Location: [facility];  Service: Cardiovascular;  Laterality: N/A;   REPLACEMENT TOTAL KNEE BILATERAL  [id number]   TOTAL KNEE REVISION Left 10/15/2020   Procedure: REVISION LEFT KNEE REPLACEMENT BEARING;  Surgeon: [name], MD;  Location: [facility];  Service: Orthopedics;  Laterality: Left;   TRANSURETHRAL RESECTION OF PROSTATE  2010    Current Medications: Current Meds  Medication Sig   atorvastatin (LIPITOR) 10 MG tablet Take 10 mg by mouth daily.   DULERA 100-5 MCG/ACT AERO INHALE TWO PUFFS BY MOUTH TWICE A DAY (IN THE MORNING AND AT BEDTIME)   losartan (COZAAR) 100 MG tablet Take 100 mg by mouth daily.   trimethoprim (TRIMPEX) 100 MG tablet Take 100 mg by mouth daily.     Allergies:    Nsaids   Social History: Social History   Socioeconomic History   Marital status: Married    Spouse name: Not on file   Number of children: Not on file  Years of education: Not on file   Highest education level: Not on file  Occupational History   Occupation: retired  Tobacco Use   Smoking status: Former    Packs/day: 3.00    Years: 30.00    Total pack years: 90.00    Types: Cigarettes    Quit date: 06/02/1985    Years since quitting: 36.4   Smokeless tobacco: Never  Vaping Use   Vaping Use: Never used  Substance and Sexual Activity   Alcohol use: Yes    Alcohol/week: 0.0 standard drinks of alcohol    Comment: daily wine, occasional vodka   Drug use: Never   Sexual activity: Not on file  Other Topics Concern   Not on file  Social History Narrative   Caffeine: yes   No diet   Exercise:  yes   Married to [place]   2 children   Retired - Education officer, environmental, with extensive traveling   Social Determinants of Corporate investment banker Strain: Not on [employer] Insecurity: Not on file  Transportation Needs: Not on file  Physical Activity: Not on file  Stress: Not on file  Social Connections: Not on file     Family History: The patient's family history includes Healthy in his brother and brother; Kidney disease in his father. There is no history of Colon cancer or Colon polyps.  ROS:   All other ROS reviewed and negative. Pertinent positives noted in the HPI.     EKGs/Labs/Other Studies Reviewed:   The following studies were personally reviewed by me today:   TTE 09/16/2021  1. Left ventricular ejection fraction, by estimation, is 50 to 55%. The  left ventricle has low normal function. The left ventricle has no regional  wall motion abnormalities. Left ventricular diastolic parameters were  normal.   2. Right ventricular systolic function is normal. The right ventricular  size is normal. There is normal pulmonary artery systolic pressure. The  estimated right ventricular systolic pressure is 31.3 mmHg.   3. The mitral valve is normal in structure. Mild mitral valve  regurgitation. No evidence of mitral stenosis.   4. The aortic valve was not well visualized. Aortic valve regurgitation  is trivial. No aortic stenosis is present.   5. The inferior vena cava is normal in size with greater than 50%  respiratory variability, suggesting right atrial pressure of 3 mmHg.  Recent Labs: No results found for requested labs within last 365 days.   Recent Lipid Panel    Component Value Date/Time   CHOL 106 08/03/2020 0853   TRIG 50 08/03/2020 0853   HDL 58 08/03/2020 0853   CHOLHDL 1.8 08/03/2020 0853   VLDL 10 08/03/2020 0853   LDLCALC 38 08/03/2020 0853    Physical Exam:   VS:  BP (!) 142/90 (BP Location: Left Arm, Patient Position: Sitting, Cuff Size: Large)    Pulse 86   Ht 6' (1.829 m)   Wt 213 lb 12.8 oz (97 kg)   SpO2 96%   BMI 29.00 kg/m    Wt Readings from Last 3 Encounters:  11/19/21 213 lb 12.8 oz (97 kg)  09/11/21 209 lb (94.8 kg)  03/29/21 214 lb (97.1 kg)    General: Well nourished, well developed, in no acute distress Head: Atraumatic, normal size  Eyes: PEERLA, EOMI  Neck: Supple, no JVD Endocrine: No thryomegaly Cardiac: Normal S1, S2; RRR; no murmurs, rubs, or gallops Lungs: Clear to auscultation bilaterally, no wheezing, rhonchi or rales  Abd:  Soft, nontender, no hepatomegaly  Ext: No edema, pulses 2+ Musculoskeletal: No deformities, BUE and BLE strength normal and equal Skin: Warm and dry, no rashes   Neuro: Alert and oriented to person, place, time, and situation, CNII-XII grossly intact, no focal deficits  Psych: Normal mood and affect   ASSESSMENT:   [name] [name] is a 81 y.o. male who presents for the following: 1. Trifascicular block   2. Pacemaker   3. Primary hypertension   4. Coronary artery disease involving native coronary artery of native heart without angina pectoris   5. Mixed hyperlipidemia     PLAN:   1. Trifascicular block 2. Pacemaker -Device is functioning well.  No issues.  3. Primary hypertension -Well-controlled.  No change in medication.  4. Coronary artery disease involving native coronary artery of native heart without angina pectoris 5. Mixed hyperlipidemia -Nonobstructive CAD on left heart cath.  Continue statin therapy.  Most recent LDL at goal.      Disposition: Return in about 1 year (around 11/20/2022).  Medication Adjustments/Labs and Tests Ordered: Current medicines are reviewed at length with the patient today.  Concerns regarding medicines are outlined above.  No orders of the defined types were placed in this encounter.  No orders of the defined types were placed in this encounter.   Patient Instructions  Medication Instructions:  The current medical regimen  is effective;  continue present plan and medications.  *If you need a refill on your cardiac medications before your next appointment, please call your pharmacy*  Follow-Up: At [facility], you and your health needs are our priority.  As part of our continuing mission to provide you with exceptional heart care, we have created designated Provider Care Teams.  These Care Teams include your primary Cardiologist (physician) and Advanced Practice Providers (APPs -  Physician Assistants and Nurse Practitioners) who all work together to provide you with the care you need, when you need it.  We recommend signing up for the patient portal called "MyChart".  Sign up information is provided on this After Visit Summary.  MyChart is used to connect with patients for Virtual Visits (Telemedicine).  Patients are able to view lab/test results, encounter notes, upcoming appointments, etc.  Non-urgent messages can be sent to your provider as well.   To learn more about what you can do with MyChart, go to [email or web address].    Your next appointment:   12 month(s)  The format for your next appointment:   In Person  Provider:   Reatha Harps, MD or Marjie Skiff, PA-C or Azalee Course, PA-C            Time Spent with Patient: I have spent a total of 25 minutes with patient reviewing hospital notes, telemetry, EKGs, labs and examining the patient as well as establishing an assessment and plan that was discussed with the patient.  > 50% of time was spent in direct patient care.  Signed, Lenna Gilford. Flora Lipps, MD, [facility]  [facility]  [address] [place], [place] [address] [phone]  11/19/2021 10:17 AM

## 2021-11-19 ENCOUNTER — Ambulatory Visit (INDEPENDENT_AMBULATORY_CARE_PROVIDER_SITE_OTHER): Payer: Medicare Other | Admitting: Cardiovascular Disease

## 2021-11-19 ENCOUNTER — Encounter: Payer: Self-pay | Admitting: Cardiovascular Disease

## 2021-11-19 VITALS — BP 142/90 | HR 86 | Ht 72.0 in | Wt 213.8 lb

## 2021-11-19 DIAGNOSIS — Z950 Presence of cardiac pacemaker: Secondary | ICD-10-CM

## 2021-11-19 DIAGNOSIS — E782 Mixed hyperlipidemia: Secondary | ICD-10-CM | POA: Diagnosis not present

## 2021-11-19 DIAGNOSIS — I453 Trifascicular block: Secondary | ICD-10-CM

## 2021-11-19 DIAGNOSIS — I10 Essential (primary) hypertension: Secondary | ICD-10-CM

## 2021-11-19 DIAGNOSIS — I2510 Atherosclerotic heart disease of native coronary artery without angina pectoris: Secondary | ICD-10-CM | POA: Diagnosis not present

## 2021-11-19 NOTE — Patient Instructions (Signed)
Medication Instructions:  The current medical regimen is effective;  continue present plan and medications.  *If you need a refill on your cardiac medications before your next appointment, please call your pharmacy*   Follow-Up: At [facility], you and your health needs are our priority.  As part of our continuing mission to provide you with exceptional heart care, we have created designated Provider Care Teams.  These Care Teams include your primary Cardiologist (physician) and Advanced Practice Providers (APPs -  Physician Assistants and Nurse Practitioners) who all work together to provide you with the care you need, when you need it.  We recommend signing up for the patient portal called "MyChart".  Sign up information is provided on this After Visit Summary.  MyChart is used to connect with patients for Virtual Visits (Telemedicine).  Patients are able to view lab/test results, encounter notes, upcoming appointments, etc.  Non-urgent messages can be sent to your provider as well.   To learn more about what you can do with MyChart, go to [email or web address].    Your next appointment:   12 month(s)  The format for your next appointment:   In Person  Provider:   Wainwright T O'Neal, MD or Callie Goodrich, PA-C or Hao Meng, PA-C           

## 2021-12-04 ENCOUNTER — Ambulatory Visit (INDEPENDENT_AMBULATORY_CARE_PROVIDER_SITE_OTHER): Payer: Medicare Other

## 2021-12-04 DIAGNOSIS — R55 Syncope and collapse: Secondary | ICD-10-CM | POA: Diagnosis not present

## 2021-12-07 LAB — CUP PACEART REMOTE DEVICE CHECK
Battery Remaining Longevity: 121 mo
Battery Voltage: 3.02 V
Brady Statistic AP VP Percent: 93.39 %
Brady Statistic AP VS Percent: 0.2 %
Brady Statistic AS VP Percent: 6.28 %
Brady Statistic AS VS Percent: 0.12 %
Brady Statistic RA Percent Paced: 93.63 %
Brady Statistic RV Percent Paced: 99.67 %
Date Time Interrogation Session: 20230705072207
Implantable Lead Implant Date: 20211001
Implantable Lead Implant Date: 20211001
Implantable Lead Location: 753859
Implantable Lead Location: 753860
Implantable Lead Model: 5076
Implantable Lead Model: 5076
Implantable Pulse Generator Implant Date: 20211001
Lead Channel Impedance Value: 323 Ohm
Lead Channel Impedance Value: 399 Ohm
Lead Channel Impedance Value: 456 Ohm
Lead Channel Impedance Value: 494 Ohm
Lead Channel Pacing Threshold Amplitude: 0.5 V
Lead Channel Pacing Threshold Amplitude: 0.75 V
Lead Channel Pacing Threshold Pulse Width: 0.4 ms
Lead Channel Pacing Threshold Pulse Width: 0.4 ms
Lead Channel Sensing Intrinsic Amplitude: 0.625 mV
Lead Channel Sensing Intrinsic Amplitude: 0.625 mV
Lead Channel Sensing Intrinsic Amplitude: 2.125 mV
Lead Channel Sensing Intrinsic Amplitude: 2.125 mV
Lead Channel Setting Pacing Amplitude: 1.5 V
Lead Channel Setting Pacing Amplitude: 2 V
Lead Channel Setting Pacing Pulse Width: 0.4 ms
Lead Channel Setting Sensing Sensitivity: 0.9 mV

## 2021-12-16 DIAGNOSIS — N183 Chronic kidney disease, stage 3 unspecified: Secondary | ICD-10-CM | POA: Diagnosis not present

## 2021-12-16 DIAGNOSIS — J449 Chronic obstructive pulmonary disease, unspecified: Secondary | ICD-10-CM | POA: Diagnosis not present

## 2021-12-16 DIAGNOSIS — E7800 Pure hypercholesterolemia, unspecified: Secondary | ICD-10-CM | POA: Diagnosis not present

## 2021-12-16 DIAGNOSIS — N400 Enlarged prostate without lower urinary tract symptoms: Secondary | ICD-10-CM | POA: Diagnosis not present

## 2021-12-16 DIAGNOSIS — I129 Hypertensive chronic kidney disease with stage 1 through stage 4 chronic kidney disease, or unspecified chronic kidney disease: Secondary | ICD-10-CM | POA: Diagnosis not present

## 2021-12-26 NOTE — Progress Notes (Signed)
Remote pacemaker transmission.   

## 2022-01-05 DIAGNOSIS — N3001 Acute cystitis with hematuria: Secondary | ICD-10-CM | POA: Diagnosis not present

## 2022-02-19 DIAGNOSIS — Z23 Encounter for immunization: Secondary | ICD-10-CM | POA: Diagnosis not present

## 2022-03-04 ENCOUNTER — Ambulatory Visit (INDEPENDENT_AMBULATORY_CARE_PROVIDER_SITE_OTHER): Payer: Medicare Other

## 2022-03-04 DIAGNOSIS — R55 Syncope and collapse: Secondary | ICD-10-CM | POA: Diagnosis not present

## 2022-03-04 LAB — CUP PACEART REMOTE DEVICE CHECK
Battery Remaining Longevity: 117 mo
Battery Voltage: 3.01 V
Brady Statistic AP VP Percent: 94.65 %
Brady Statistic AP VS Percent: 0.21 %
Brady Statistic AS VP Percent: 5.06 %
Brady Statistic AS VS Percent: 0.08 %
Brady Statistic RA Percent Paced: 94.89 %
Brady Statistic RV Percent Paced: 99.71 %
Date Time Interrogation Session: 20231003070429
Implantable Lead Implant Date: 20211001
Implantable Lead Implant Date: 20211001
Implantable Lead Location: 753859
Implantable Lead Location: 753860
Implantable Lead Model: 5076
Implantable Lead Model: 5076
Implantable Pulse Generator Implant Date: 20211001
Lead Channel Impedance Value: 323 Ohm
Lead Channel Impedance Value: 399 Ohm
Lead Channel Impedance Value: 456 Ohm
Lead Channel Impedance Value: 475 Ohm
Lead Channel Pacing Threshold Amplitude: 0.5 V
Lead Channel Pacing Threshold Amplitude: 0.75 V
Lead Channel Pacing Threshold Pulse Width: 0.4 ms
Lead Channel Pacing Threshold Pulse Width: 0.4 ms
Lead Channel Sensing Intrinsic Amplitude: 0.625 mV
Lead Channel Sensing Intrinsic Amplitude: 0.625 mV
Lead Channel Sensing Intrinsic Amplitude: 2.625 mV
Lead Channel Sensing Intrinsic Amplitude: 2.625 mV
Lead Channel Setting Pacing Amplitude: 1.5 V
Lead Channel Setting Pacing Amplitude: 2 V
Lead Channel Setting Pacing Pulse Width: 0.4 ms
Lead Channel Setting Sensing Sensitivity: 0.9 mV

## 2022-03-19 NOTE — Progress Notes (Signed)
Remote pacemaker transmission.   

## 2022-03-25 DIAGNOSIS — I129 Hypertensive chronic kidney disease with stage 1 through stage 4 chronic kidney disease, or unspecified chronic kidney disease: Secondary | ICD-10-CM | POA: Diagnosis not present

## 2022-03-25 DIAGNOSIS — E663 Overweight: Secondary | ICD-10-CM | POA: Diagnosis not present

## 2022-03-25 DIAGNOSIS — R7303 Prediabetes: Secondary | ICD-10-CM | POA: Diagnosis not present

## 2022-03-25 DIAGNOSIS — N183 Chronic kidney disease, stage 3 unspecified: Secondary | ICD-10-CM | POA: Diagnosis not present

## 2022-03-25 DIAGNOSIS — N400 Enlarged prostate without lower urinary tract symptoms: Secondary | ICD-10-CM | POA: Diagnosis not present

## 2022-03-25 DIAGNOSIS — E7800 Pure hypercholesterolemia, unspecified: Secondary | ICD-10-CM | POA: Diagnosis not present

## 2022-03-25 DIAGNOSIS — Z6829 Body mass index (BMI) 29.0-29.9, adult: Secondary | ICD-10-CM | POA: Diagnosis not present

## 2022-04-07 DIAGNOSIS — Z6828 Body mass index (BMI) 28.0-28.9, adult: Secondary | ICD-10-CM | POA: Diagnosis not present

## 2022-04-07 DIAGNOSIS — N183 Chronic kidney disease, stage 3 unspecified: Secondary | ICD-10-CM | POA: Diagnosis not present

## 2022-04-07 DIAGNOSIS — I10 Essential (primary) hypertension: Secondary | ICD-10-CM | POA: Diagnosis not present

## 2022-04-28 DIAGNOSIS — Z0000 Encounter for general adult medical examination without abnormal findings: Secondary | ICD-10-CM | POA: Diagnosis not present

## 2022-04-28 DIAGNOSIS — Z1389 Encounter for screening for other disorder: Secondary | ICD-10-CM | POA: Diagnosis not present

## 2022-04-28 DIAGNOSIS — Z6829 Body mass index (BMI) 29.0-29.9, adult: Secondary | ICD-10-CM | POA: Diagnosis not present

## 2022-06-02 ENCOUNTER — Encounter (HOSPITAL_COMMUNITY): Payer: Self-pay

## 2022-06-02 ENCOUNTER — Ambulatory Visit (HOSPITAL_COMMUNITY)
Admission: EM | Admit: 2022-06-02 | Discharge: 2022-06-02 | Disposition: A | Discharge status: Home / Self Care | Payer: Medicare Other | Attending: Nurse Practitioner | Admitting: Nurse Practitioner

## 2022-06-02 DIAGNOSIS — H1033 Unspecified acute conjunctivitis, bilateral: Secondary | ICD-10-CM | POA: Diagnosis not present

## 2022-06-02 MED ORDER — POLYMYXIN B-TRIMETHOPRIM 10000-0.1 UNIT/ML-% OP SOLN: 1.0000 [drp] | Freq: Four times a day (QID) | OPHTHALMIC | 0 refills | Status: AC

## 2022-06-02 NOTE — ED Provider Notes (Signed)
[place]    CSN: [id number] Arrival date & time: 06/02/22  0807      History   Chief Complaint Chief Complaint  Patient presents with   Eye Drainage    HPI [name] [name] is a 82 y.o. male  presents for evaluation of an eye complaint for the past 4 day. Patient reports bilateral eye redness, itching, irritation and purulent discharge.  He denies loss or change in vision, photophobia, trauma or pain.  He wears glasses only.  Reports mild URI symptoms.  Patient has taken nothing OTC for symptoms. Pt has no other concerns at this time.   HPI  Past Medical History:  Diagnosis Date   Arthritis    Chronic kidney disease    high creatinine level    Colon polyp March 2010   colonoscopy with Dr Oletta Lamas   COPD (chronic obstructive pulmonary disease) ([facility])    mild    Elevated fasting glucose    Hepatitis    History of BPH    with obstruction and hydronephrosis and slihtly elevated creatinine, using self catherization, followed by urologist Dr McDiarmid   History of ETT 2012   [name]   History of tobacco use    54 pack yr   HTN (hypertension)    Paroxysmal supraventricular tachycardia    Presence of permanent cardiac pacemaker    Screening for AAA (abdominal aortic aneurysm) 3/16   3cm, nl/early aneurysm, repeat 3 yrs with u/s 3/19    Patient Active Problem List   Diagnosis Date Noted   Painful total knee replacement, left ([facility]) 10/12/2020   Pre-operative respiratory examination 10/11/2020   Unstable angina ([place]) 08/03/2020   Chest pain 08/02/2020   Syncope 06/06/2020   Trifascicular block 06/06/2020   Pacemaker 06/06/2020   Bradycardia 02/29/2020   Upper airway cough syndrome 08/11/2017   Dyspnea 07/18/2015   Right bundle branch block 07/18/2015   History of bradycardia 07/18/2015   COPD, group B, by GOLD 2017 classification ([place]) 07/18/2015   Essential hypertension 07/18/2015    Past Surgical History:  Procedure Laterality Date   APPENDECTOMY      LEFT HEART CATH AND CORONARY ANGIOGRAPHY N/A 08/03/2020   Procedure: LEFT HEART CATH AND CORONARY ANGIOGRAPHY;  Surgeon: Burnell Blanks, MD;  Location: [facility];  Service: Cardiovascular;  Laterality: N/A;   PACEMAKER IMPLANT N/A 03/02/2020   Procedure: PACEMAKER IMPLANT;  Surgeon: Vickie Epley, MD;  Location: [facility];  Service: Cardiovascular;  Laterality: N/A;   REPLACEMENT TOTAL KNEE BILATERAL  8546,[id number]   TOTAL KNEE REVISION Left 10/15/2020   Procedure: REVISION LEFT KNEE REPLACEMENT BEARING;  Surgeon: Frederik Pear, MD;  Location: [facility];  Service: Orthopedics;  Laterality: Left;   TRANSURETHRAL RESECTION OF PROSTATE  2010       Home Medications    Prior to Admission medications   Medication Sig Start Date End Date Taking? Authorizing Provider  trimethoprim-polymyxin b (POLYTRIM) ophthalmic solution Place 1 drop into both eyes every 6 (six) hours for 7 days. 06/02/22 06/09/22 Yes Melynda Ripple, NP  atorvastatin (LIPITOR) 10 MG tablet Take 10 mg by mouth daily. 07/25/19   [provider]  DULERA 100-5 MCG/ACT AERO INHALE TWO PUFFS BY MOUTH TWICE A DAY (IN THE MORNING AND AT BEDTIME) 11/13/21   Mannam, Praveen, MD  losartan (COZAAR) 100 MG tablet Take 100 mg by mouth daily. 04/19/15   [provider]  trimethoprim (TRIMPEX) 100 MG tablet Take 100 mg by  mouth daily. 06/04/15   [provider]    Family History Family History  Problem Relation Age of Onset   Kidney disease Father        ESRD   Healthy Brother    Healthy Brother    Colon cancer Neg Hx    Colon polyps Neg Hx     Social History Social History   Tobacco Use   Smoking status: Former    Packs/day: 3.00    Years: 30.00    Total pack years: 90.00    Types: Cigarettes    Quit date: 06/02/1985    Years since quitting: 37.0   Smokeless tobacco: Never  Vaping Use   Vaping Use: Never used  Substance Use Topics   Alcohol use: Yes    Alcohol/week: 0.0 standard  drinks of alcohol    Comment: daily wine, occasional vodka   Drug use: Never     Allergies   Nsaids   Review of Systems Review of Systems  Eyes:  Positive for discharge, redness and itching.     Physical Exam Triage Vital Signs ED Triage Vitals  Enc Vitals Group     BP 06/02/22 0823 (!) 171/79     Pulse Rate 06/02/22 0823 82     Resp 06/02/22 0823 18     Temp 06/02/22 0823 97.8 F (36.6 C)     Temp Source 06/02/22 0823 Oral     SpO2 06/02/22 0823 94 %     Weight --      Height --      Head Circumference --      Peak Flow --      Pain Score 06/02/22 0824 5     Pain Loc --      Pain Edu? --      Excl. in GC? --    No data found.  Updated Vital Signs BP (!) 171/79 (BP Location: Left Arm)   Pulse 82   Temp 97.8 F (36.6 C) (Oral)   Resp 18   SpO2 94%   Visual Acuity Right Eye Distance:   Left Eye Distance:   Bilateral Distance:    Right Eye Near:   Left Eye Near:    Bilateral Near:     Physical Exam Vitals and nursing note reviewed.  Constitutional:      General: He is not in acute distress.    Appearance: Normal appearance. He is not ill-appearing or toxic-appearing.  HENT:     Head: Normocephalic and atraumatic.     Right Ear: Tympanic membrane and ear canal normal.     Left Ear: Tympanic membrane and ear canal normal.     Nose: Congestion present.     Mouth/Throat:     Mouth: Mucous membranes are moist.     Pharynx: Posterior oropharyngeal erythema present.  Eyes:     General: Lids are normal.        Right eye: Discharge present. No foreign body or hordeolum.        Left eye: Discharge present.No foreign body or hordeolum.     Conjunctiva/sclera:     Right eye: Right conjunctiva is injected. No chemosis, exudate or hemorrhage.    Left eye: Left conjunctiva is injected. No chemosis, exudate or hemorrhage.    Pupils: Pupils are equal, round, and reactive to light.     Comments: Mild watery discharge  Cardiovascular:     Rate and Rhythm:  Normal rate and regular rhythm.     Heart sounds:  Normal heart sounds.  Pulmonary:     Effort: Pulmonary effort is normal.     Breath sounds: Normal breath sounds.  Musculoskeletal:     Cervical back: Normal range of motion and neck supple.  Lymphadenopathy:     Cervical: No cervical adenopathy.  Skin:    General: Skin is warm and dry.  Neurological:     General: No focal deficit present.     Mental Status: He is alert and oriented to person, place, and time.  Psychiatric:        Mood and Affect: Mood normal.        Behavior: Behavior normal.      UC Treatments / Results  Labs (all labs ordered are listed, but only abnormal results are displayed) Labs Reviewed - No data to display  EKG   Radiology No results found.  Procedures Procedures (including critical care time)  Medications Ordered in UC Medications - No data to display  Initial Impression / Assessment and Plan / UC Course  I have reviewed the triage vital signs and the nursing notes.  Pertinent labs & imaging results that were available during my care of the patient were reviewed by me and considered in my medical decision making (see chart for details).     Reviewed exam and symptoms with patient.  Discussed different types of conjunctivitis including viral Has patient reports of purulent drainage from the eyes will start Polytrim.  Advised after 2 days if no improvement may stop and start over-the-counter antihistamine eyedrop such as Clear Eyes Warm compresses to the eyes as needed Follow up with PCP if symptoms do not improve ER precautions reviewed and pt verbalized understanding  Final Clinical Impressions(s) / UC Diagnoses   Final diagnoses:  Acute conjunctivitis of both eyes, unspecified acute conjunctivitis type     Discharge Instructions      Start Polytrim drops as prescribed.  If no improvement after 2 days may stop and use over-the-counter antihistamine eyedrop such as Clear Eyes Warm  compresses to the eyes as needed Follow-up with your PCP 2 to 3 days for recheck I hope you feel better soon!   ED Prescriptions     Medication Sig Dispense Auth. Provider   trimethoprim-polymyxin b (POLYTRIM) ophthalmic solution Place 1 drop into both eyes every 6 (six) hours for 7 days. 10 mL Melynda Ripple, NP      PDMP not reviewed this encounter.   Melynda Ripple, NP 06/02/22 [phone]

## 2022-06-02 NOTE — Discharge Instructions (Signed)
Start Polytrim drops as prescribed.  If no improvement after 2 days may stop and use over-the-counter antihistamine eyedrop such as Clear Eyes Warm compresses to the eyes as needed Follow-up with your PCP 2 to 3 days for recheck I hope you feel better soon!

## 2022-06-02 NOTE — ED Triage Notes (Signed)
Patient with c/o eye redness, itchiness, and drainage for about 3-4 days.

## 2022-06-03 ENCOUNTER — Ambulatory Visit (INDEPENDENT_AMBULATORY_CARE_PROVIDER_SITE_OTHER): Payer: Medicare Other

## 2022-06-03 DIAGNOSIS — R55 Syncope and collapse: Secondary | ICD-10-CM | POA: Diagnosis not present

## 2022-06-03 LAB — CUP PACEART REMOTE DEVICE CHECK
Battery Remaining Longevity: 113 mo
Battery Voltage: 3.01 V
Brady Statistic AP VP Percent: 95.84 %
Brady Statistic AP VS Percent: 0.12 %
Brady Statistic AS VP Percent: 3.98 %
Brady Statistic AS VS Percent: 0.06 %
Brady Statistic RA Percent Paced: 95.96 %
Brady Statistic RV Percent Paced: 99.82 %
Date Time Interrogation Session: 20240102101254
Implantable Lead Connection Status: 753985
Implantable Lead Connection Status: 753985
Implantable Lead Implant Date: 20211001
Implantable Lead Implant Date: 20211001
Implantable Lead Location: 753859
Implantable Lead Location: 753860
Implantable Lead Model: 5076
Implantable Lead Model: 5076
Implantable Pulse Generator Implant Date: 20211001
Lead Channel Impedance Value: 323 Ohm
Lead Channel Impedance Value: 418 Ohm
Lead Channel Impedance Value: 475 Ohm
Lead Channel Impedance Value: 494 Ohm
Lead Channel Pacing Threshold Amplitude: 0.5 V
Lead Channel Pacing Threshold Amplitude: 0.875 V
Lead Channel Pacing Threshold Pulse Width: 0.4 ms
Lead Channel Pacing Threshold Pulse Width: 0.4 ms
Lead Channel Sensing Intrinsic Amplitude: 0.875 mV
Lead Channel Sensing Intrinsic Amplitude: 0.875 mV
Lead Channel Sensing Intrinsic Amplitude: 3 mV
Lead Channel Sensing Intrinsic Amplitude: 3 mV
Lead Channel Setting Pacing Amplitude: 1.75 V
Lead Channel Setting Pacing Amplitude: 2 V
Lead Channel Setting Pacing Pulse Width: 0.4 ms
Lead Channel Setting Sensing Sensitivity: 0.9 mV
Zone Setting Status: 755011
Zone Setting Status: 755011

## 2022-06-04 DIAGNOSIS — R3914 Feeling of incomplete bladder emptying: Secondary | ICD-10-CM | POA: Diagnosis not present

## 2022-06-04 DIAGNOSIS — N1330 Unspecified hydronephrosis: Secondary | ICD-10-CM | POA: Diagnosis not present

## 2022-06-17 ENCOUNTER — Other Ambulatory Visit: Payer: Self-pay | Admitting: Pulmonary Disease

## 2022-06-17 DIAGNOSIS — J449 Chronic obstructive pulmonary disease, unspecified: Secondary | ICD-10-CM

## 2022-06-19 DIAGNOSIS — Z6828 Body mass index (BMI) 28.0-28.9, adult: Secondary | ICD-10-CM | POA: Diagnosis not present

## 2022-06-19 DIAGNOSIS — U071 COVID-19: Secondary | ICD-10-CM | POA: Diagnosis not present

## 2022-07-08 NOTE — Progress Notes (Signed)
Remote pacemaker transmission.   

## 2022-07-26 ENCOUNTER — Other Ambulatory Visit: Payer: Self-pay | Admitting: Pulmonary Disease

## 2022-07-26 DIAGNOSIS — J449 Chronic obstructive pulmonary disease, unspecified: Secondary | ICD-10-CM

## 2022-07-28 ENCOUNTER — Ambulatory Visit (INDEPENDENT_AMBULATORY_CARE_PROVIDER_SITE_OTHER): Payer: Medicare Other | Admitting: Pulmonary Disease

## 2022-07-28 ENCOUNTER — Encounter: Payer: Self-pay | Admitting: Pulmonary Disease

## 2022-07-28 ENCOUNTER — Ambulatory Visit (INDEPENDENT_AMBULATORY_CARE_PROVIDER_SITE_OTHER): Payer: Medicare Other

## 2022-07-28 VITALS — BP 136/64 | HR 84 | Temp 97.7°F | Ht 73.0 in | Wt 214.8 lb

## 2022-07-28 DIAGNOSIS — J439 Emphysema, unspecified: Secondary | ICD-10-CM | POA: Diagnosis not present

## 2022-07-28 DIAGNOSIS — J449 Chronic obstructive pulmonary disease, unspecified: Secondary | ICD-10-CM

## 2022-07-28 MED ORDER — DULERA 100-5 MCG/ACT IN AERO: INHALATION_SPRAY | RESPIRATORY_TRACT | 11 refills | Status: DC

## 2022-07-28 NOTE — Patient Instructions (Signed)
Will renew the [facility] Will get a chest x-ray Follow-up in 1 year.

## 2022-07-28 NOTE — Progress Notes (Signed)
[name] [name]    NH:[id number]    01/26/1941  Primary Care Physician:[name], [name] Haw, MD  Referring Physician: Kathyrn [name], [place],  [name] [name] [id number]  Chief complaint:   Follow up for COPD GOLD B  HPI: [name] [name] is a 82 y.o. with past medical history of COPD (CAT score 15, 0 exacerbations over past year, FEV1 30%), hypertension, heavy smoking in the past [quit in 1987]  He was seen by Dr. Rex [name] at the primary care office in Dec 2016 for an exacerbation and given a prednisone taper starting at 60 mg for 12 days and started on Dulera inhaler. He reports that his symptoms have improved and feels close to his baseline.  Tried on Anoro inhaler in 2019.  He switch back to [facility] since he felt that the new inhaler did not work well for him  Underwent total knee replacement earlier in 2022 without any respiratory issues   Interim History: Continues on Dulera Complains of mild chest tightness and heartburn symptoms which is intermittent in nature.  Has taken over-the-counter antiacid medication without any change.  He has discussed this with his cardiologist who feels this is not heart related.  Outpatient Encounter Medications as of 07/28/2022  Medication Sig   amLODipine (NORVASC) 5 MG tablet Take 5 mg by mouth daily.   atorvastatin (LIPITOR) 10 MG tablet Take 10 mg by mouth daily.   DULERA 100-5 MCG/ACT AERO INHALE TWO PUFFS BY MOUTH TWICE A DAY (IN THE MORNING AND AT BEDTIME)   losartan (COZAAR) 100 MG tablet Take 100 mg by mouth daily.   trimethoprim (TRIMPEX) 100 MG tablet Take 100 mg by mouth daily.   No facility-administered encounter medications on file as of 07/28/2022.   Physical Exam: Blood pressure 136/64, pulse 84, temperature 97.7 F (36.5 C), temperature source Oral, height '6\' 1"'$  (1.854 m), weight 214 lb 12.8 oz (97.4 kg), SpO2 96 %. Gen:      No acute distress HEENT:  EOMI, sclera anicteric Neck:     No masses; no thyromegaly Lungs:     Clear to auscultation bilaterally; normal respiratory effort CV:         Regular rate and rhythm; no murmurs Abd:      + bowel sounds; soft, non-tender; no palpable masses, no distension Ext:    No edema; adequate peripheral perfusion Skin:      Warm and dry; no rash Neuro: alert and oriented x 3 Psych: normal mood and affect   Data Reviewed: PFTs  05/23/15 FVC 3.59 (78%), FEV1 1.08 (30%), F/F 38 Severe obstructive lung disease.  08/11/2017 FVC 4.72 [105%], FEV1 2.92 [90%], F/F 62, TLC 142%, DLCO 70% Mild obstructive airway disease with air trapping, hyperinflation, reversibility.  12/01/2019 FVC 4.74 [107%], FEV1 3.03 [94%], F/F 64, TLC 9.46 [126%), DLCO 23.57 [90%] Mild obstructive airway disease with air trapping and hyperinflation.  Mildly improved compared to 2019  Labs A1AT levels 12/20/15- 109, PIMM phenotype CBC 08/11/2017-WBC 10.7, eos 1.7%, absolute eosinophil count 182 Blood allergy profile 08/11/2017-IgE 154, RAST panel negative   Imaging Chest x-ray 07/03/16-  hyperinflated lung, mild left base atelectasis/scarring Chest x-ray 07/13/2017- hyperinflated lungs CT abdomen pelvis 07/24/2017- mild scarring in the bases.  No evidence of interstitial lung disease. Chest x-ray 10/10/2019-no acute cardiopulmonary disease Chest x-ray 10/11/2020-mild bibasilar atelectasis I have reviewed the images personally  Assessment:  COPD GOLD B, asthma Continues on [facility]  which is working well for him Pulmonary rehab discussed but deferred for now as he prefers to avoid   GERD Complains of mild heartburn symptoms Advised over-the-counter Prilosec and Tums  Health maintenance 08/19/2013-Prevnar 06/02/2013-Pneumovax Vaccinated with Covid   Plan/Recommendations: - Continue Dulera - Chest x-ray  Follow-up in 1 year  [name] Garfinkel MD [facility] 07/28/2022, 8:33 AM  CC: [name] Lass, MD

## 2022-08-19 ENCOUNTER — Telehealth: Payer: Self-pay | Admitting: Cardiology

## 2022-08-19 NOTE — Telephone Encounter (Signed)
Called patient to send remote transmission.   Patient reports missing all medications Friday and other days he misses as well. Patient is taking Amlodipine. Presenting rhythm appears AP/VP w/ frequent PVC's. No alerts noted on device. Lead numbers available appear WNL. Discussed with pt how PVC's can cause the pulse ox readings to alter based on timing but assure patient his device is working normally and his rate is not in the 40's.   Stressed importance of medication compliance to patient and not to miss or skip doses. Also recommended patient checking his BP and keep a log. Patient advised I will forward to Dr. Quentin Ore and triage for review and ED precautions given. Patient voiced understanding.

## 2022-08-19 NOTE — Telephone Encounter (Signed)
STAT if HR is under 50 or over 120 (normal HR is 60-100 beats per minute)  What is your heart rate?   Do you have a log of your heart rate readings (document readings)?  Patient does not have any exact readings available  Do you have any other symptoms?  Patient states HR fluctuates 40's-80's. No additional symptoms. He also mentions that sometimes his pulse feels faint and other times it feels strong.

## 2022-08-19 NOTE — Telephone Encounter (Signed)
Spoke with the patient who reports that his heart rate has been fluctuating since last night. He states that is has gone down into the 40s and up into the 80s. He states his pacemaker should be keeping it around 60 bpm. States that he has had some lightheadedness but not other symptoms. Advised I will send message to our device clinic to follow up with him and check device.

## 2022-09-01 NOTE — Progress Notes (Signed)
Cardiology Office Note Date:  09/08/2022  Patient ID:  [name] [name], [name] [name] Oct 15, 1940, MRN [id number] PCP:  Sigmund Hazel, MD  Cardiologist:  Dr. Flora Lipps Electrophysiologist: Dr. Lalla Brothers     Chief Complaint:  12 mo  History of Present Illness: [name] [name] is a 82 y.o. male with history of COPD, HTN, syncope w/heart block > PPM, NOD by cath, HLD, bladder dysfunction (self caths), smoker  He saw Dr. Lalla Brothers 09/11/21, doing well, swimming, looking forward to travel [place].  Planned for echo given high RV pacing%  TTE noted preserved LVEF  He saw Dr. Flora Lipps 11/01/21, doing well, LDL at goal, no changes were made.  TODAY About a month ago he had a couple hours felt his heart beat unusual, strong beats, pulse felt slow and irregular, no associated symptoms, and not aware of it again. He has a chronic vague CP in the center of his chest that he feels when it is quite cold out, attributes to his COPD, not new, no changing He swims and goes to the gym regularly, reports good exertional capacity, no symptoms with exercise. No near syncope or syncope.  Device information MDT dual chamber PPM implanted 03/02/20   Past Medical History:  Diagnosis Date   Arthritis    Chronic kidney disease    high creatinine level    Colon polyp March 2010   colonoscopy with Dr Randa Evens   COPD (chronic obstructive pulmonary disease) ([facility])    mild    Elevated fasting glucose    Hepatitis    History of BPH    with obstruction and hydronephrosis and slihtly elevated creatinine, using self catherization, followed by urologist Dr McDiarmid   History of ETT 2012   [name]   History of tobacco use    54 pack yr   HTN (hypertension)    Paroxysmal supraventricular tachycardia    Presence of permanent cardiac pacemaker    Screening for AAA (abdominal aortic aneurysm) 3/16   3cm, nl/early aneurysm, repeat 3 yrs with u/s 3/19    Past Surgical History:  Procedure Laterality Date   APPENDECTOMY      LEFT HEART CATH AND CORONARY ANGIOGRAPHY N/A 08/03/2020   Procedure: LEFT HEART CATH AND CORONARY ANGIOGRAPHY;  Surgeon: Kathleene Hazel, MD;  Location: [facility];  Service: Cardiovascular;  Laterality: N/A;   PACEMAKER IMPLANT N/A 03/02/2020   Procedure: PACEMAKER IMPLANT;  Surgeon: Lanier Prude, MD;  Location: [facility];  Service: Cardiovascular;  Laterality: N/A;   REPLACEMENT TOTAL KNEE BILATERAL  4920,[id number]   TOTAL KNEE REVISION Left 10/15/2020   Procedure: REVISION LEFT KNEE REPLACEMENT BEARING;  Surgeon: [name], MD;  Location: [facility];  Service: Orthopedics;  Laterality: Left;   TRANSURETHRAL RESECTION OF PROSTATE  2010    Current Outpatient Medications  Medication Sig Dispense Refill   amLODipine (NORVASC) 5 MG tablet Take 5 mg by mouth daily.     atorvastatin (LIPITOR) 10 MG tablet Take 10 mg by mouth daily.     DULERA 100-5 MCG/ACT AERO INHALE 2 PUFFS BY MOUTH TWICE DAILY (IN THE MORNING AND AT BEDTIME) 13 g 6   losartan (COZAAR) 100 MG tablet Take 100 mg by mouth daily.     trimethoprim (TRIMPEX) 100 MG tablet Take 100 mg by mouth daily.     No current facility-administered medications for this visit.    Allergies:   Nsaids   Social History:  The patient  reports that he quit  smoking about 37 years ago. His smoking use included cigarettes. He has a 90.00 pack-year smoking history. He has never used smokeless tobacco. He reports current alcohol use. He reports that he does not use drugs.   Family History:  The patient's family history includes Healthy in his brother and brother; Kidney disease in his father.  ROS:  Please see the history of present illness.    All other systems are reviewed and otherwise negative.   PHYSICAL EXAM:  VS:  BP 122/84   Pulse 76   Ht 6\' 1"  (1.854 m)   Wt 213 lb 9.6 oz (96.9 kg)   SpO2 97%   BMI 28.18 kg/m  BMI: Body mass index is 28.18 kg/m. Well nourished, well developed, in no acute distress HEENT:  normocephalic, atraumatic Neck: no JVD, carotid bruits or masses Cardiac:   RRR; no significant murmurs, no rubs, or gallops Lungs:  CTA b/l, no wheezing, rhonchi or rales Abd: soft, nontender MS: no deformity or atrophy Ext: no edema Skin: warm and dry, no rash Neuro:  No gross deficits appreciated Psych: euthymic mood, full affect  PPM site is stable, no tethering or discomfort   EKG:  Done today and reviewed by myself shows  AV paced  Device interrogation done today and reviewed by myself:  Battery and lead measurements are good Intermittently note during interrogation that he has PVCs, once briefly bigeminy, he was unaware No arrhythmias   09/16/21: TTE 1. Left ventricular ejection fraction, by estimation, is 50 to 55%. The  left ventricle has low normal function. The left ventricle has no regional  wall motion abnormalities. Left ventricular diastolic parameters were  normal.   2. Right ventricular systolic function is normal. The right ventricular  size is normal. There is normal pulmonary artery systolic pressure. The  estimated right ventricular systolic pressure is 31.3 mmHg.   3. The mitral valve is normal in structure. Mild mitral valve  regurgitation. No evidence of mitral stenosis.   4. The aortic valve was not well visualized. Aortic valve regurgitation  is trivial. No aortic stenosis is present.   5. The inferior vena cava is normal in size with greater than 50%  respiratory variability, suggesting right atrial pressure of 3 mmHg.    08/03/20: LHC Mid Cx lesion is 20% stenosed. Mid LAD lesion is 20% stenosed. Dist LAD lesion is 20% stenosed.   1. Mild non-obstructive CAD 2. Normal LV filling pressure 3. Non-cardiac chest pain    Recent Labs: No results found for requested labs within last 365 days.  No results found for requested labs within last 365 days.   CrCl cannot be calculated (Patient's most recent lab result is older than the maximum 21 days  allowed.).   Wt Readings from Last 3 Encounters:  09/08/22 213 lb 9.6 oz (96.9 kg)  07/28/22 214 lb 12.8 oz (97.4 kg)  11/19/21 213 lb 12.8 oz (97 kg)     Other studies reviewed: Additional studies/records reviewed today include: summarized above  ASSESSMENT AND PLAN:  PPM Intact function No programming changes made  HTN Looks good  3. PVCs Will get lytes today and have him wear a 3 day monitor to assess burden  Disposition: F/u with with EP in a year again in clinic, sooner if needed, pending findings, remotes as usual  Current medicines are reviewed at length with the patient today.  The patient did not have any concerns regarding medicines.  Norma Fredrickson, PA-C 09/08/2022 8:54 AM  [facility] [place]  [place] [phone] (office)  [phone] (fax)

## 2022-09-02 ENCOUNTER — Ambulatory Visit (INDEPENDENT_AMBULATORY_CARE_PROVIDER_SITE_OTHER): Payer: Medicare Other

## 2022-09-02 DIAGNOSIS — I4510 Unspecified right bundle-branch block: Secondary | ICD-10-CM

## 2022-09-03 LAB — CUP PACEART REMOTE DEVICE CHECK
Battery Remaining Longevity: 112 mo
Battery Voltage: 3.01 V
Brady Statistic AP VP Percent: 86.95 %
Brady Statistic AP VS Percent: 0.87 %
Brady Statistic AS VP Percent: 5.47 %
Brady Statistic AS VS Percent: 6.71 %
Brady Statistic RA Percent Paced: 91.05 %
Brady Statistic RV Percent Paced: 92.42 %
Date Time Interrogation Session: 20240401220915
Implantable Lead Connection Status: 753985
Implantable Lead Connection Status: 753985
Implantable Lead Implant Date: 20211001
Implantable Lead Implant Date: 20211001
Implantable Lead Location: 753859
Implantable Lead Location: 753860
Implantable Lead Model: 5076
Implantable Lead Model: 5076
Implantable Pulse Generator Implant Date: 20211001
Lead Channel Impedance Value: 304 Ohm
Lead Channel Impedance Value: 437 Ohm
Lead Channel Impedance Value: 456 Ohm
Lead Channel Impedance Value: 494 Ohm
Lead Channel Pacing Threshold Amplitude: 0.5 V
Lead Channel Pacing Threshold Amplitude: 0.75 V
Lead Channel Pacing Threshold Pulse Width: 0.4 ms
Lead Channel Pacing Threshold Pulse Width: 0.4 ms
Lead Channel Sensing Intrinsic Amplitude: 1.125 mV
Lead Channel Sensing Intrinsic Amplitude: 1.125 mV
Lead Channel Sensing Intrinsic Amplitude: 2.375 mV
Lead Channel Sensing Intrinsic Amplitude: 2.375 mV
Lead Channel Setting Pacing Amplitude: 1.5 V
Lead Channel Setting Pacing Amplitude: 2 V
Lead Channel Setting Pacing Pulse Width: 0.4 ms
Lead Channel Setting Sensing Sensitivity: 0.9 mV
Zone Setting Status: 755011
Zone Setting Status: 755011

## 2022-09-08 ENCOUNTER — Ambulatory Visit: Payer: Medicare Other | Attending: Physician Assistant | Admitting: Physician Assistant

## 2022-09-08 ENCOUNTER — Ambulatory Visit (INDEPENDENT_AMBULATORY_CARE_PROVIDER_SITE_OTHER): Payer: Medicare Other

## 2022-09-08 VITALS — BP 122/84 | HR 76 | Ht 73.0 in | Wt 213.6 lb

## 2022-09-08 DIAGNOSIS — I10 Essential (primary) hypertension: Secondary | ICD-10-CM | POA: Insufficient documentation

## 2022-09-08 DIAGNOSIS — Z950 Presence of cardiac pacemaker: Secondary | ICD-10-CM | POA: Diagnosis not present

## 2022-09-08 DIAGNOSIS — I493 Ventricular premature depolarization: Secondary | ICD-10-CM | POA: Diagnosis not present

## 2022-09-08 LAB — CUP PACEART INCLINIC DEVICE CHECK
Battery Remaining Longevity: 112 mo
Battery Voltage: 3.01 V
Brady Statistic AP VP Percent: 93.91 %
Brady Statistic AP VS Percent: 0.3 %
Brady Statistic AS VP Percent: 4.92 %
Brady Statistic AS VS Percent: 0.87 %
Brady Statistic RA Percent Paced: 94.56 %
Brady Statistic RV Percent Paced: 98.83 %
Date Time Interrogation Session: 20240408095619
Implantable Lead Connection Status: 753985
Implantable Lead Connection Status: 753985
Implantable Lead Implant Date: 20211001
Implantable Lead Implant Date: 20211001
Implantable Lead Location: 753859
Implantable Lead Location: 753860
Implantable Lead Model: 5076
Implantable Lead Model: 5076
Implantable Pulse Generator Implant Date: 20211001
Lead Channel Impedance Value: 323 Ohm
Lead Channel Impedance Value: 418 Ohm
Lead Channel Impedance Value: 475 Ohm
Lead Channel Impedance Value: 494 Ohm
Lead Channel Pacing Threshold Amplitude: 0.5 V
Lead Channel Pacing Threshold Amplitude: 0.75 V
Lead Channel Pacing Threshold Pulse Width: 0.4 ms
Lead Channel Pacing Threshold Pulse Width: 0.4 ms
Lead Channel Sensing Intrinsic Amplitude: 0.75 mV
Lead Channel Sensing Intrinsic Amplitude: 1 mV
Lead Channel Sensing Intrinsic Amplitude: 2.25 mV
Lead Channel Sensing Intrinsic Amplitude: 2.375 mV
Lead Channel Setting Pacing Amplitude: 1.5 V
Lead Channel Setting Pacing Amplitude: 2 V
Lead Channel Setting Pacing Pulse Width: 0.4 ms
Lead Channel Setting Sensing Sensitivity: 0.9 mV
Zone Setting Status: 755011
Zone Setting Status: 755011

## 2022-09-08 NOTE — Patient Instructions (Addendum)
Medication Instructions:    Your physician recommends that you continue on your current medications as directed. Please refer to the Current Medication list given to you today.  *If you need a refill on your cardiac medications before your next appointment, please call your pharmacy*   Lab Work:  BMET AND MAG TODAY   If you have labs (blood work) drawn today and your tests are completely normal, you will receive your results only by: MyChart Message (if you have MyChart) OR A paper copy in the mail If you have any lab test that is abnormal or we need to change your treatment, we will call you to review the results.   Testing/Procedures: Your physician has recommended that you wear an event monitor. Event monitors are medical devices that record the heart's electrical activity. Doctors most often [place] these monitors to diagnose arrhythmias. Arrhythmias are problems with the speed or rhythm of the heartbeat. The monitor is a small, portable device. You can wear one while you do your normal daily activities. This is usually used to diagnose what is causing palpitations/syncope (passing out).     Follow-Up: At [facility], you and your health needs are our priority.  As part of our continuing mission to provide you with exceptional heart care, we have created designated Provider Care Teams.  These Care Teams include your primary Cardiologist (physician) and Advanced Practice Providers (APPs -  Physician Assistants and Nurse Practitioners) who all work together to provide you with the care you need, when you need it.  We recommend signing up for the patient portal called "MyChart".  Sign up information is provided on this After Visit Summary.  MyChart is used to connect with patients for Virtual Visits (Telemedicine).  Patients are able to view lab/test results, encounter notes, upcoming appointments, etc.  Non-urgent messages can be sent to your provider as well.   To learn more about  what you can do with MyChart, go to [email or web address].    Your next appointment:   1 year(s)  Provider:   You may see Lanier Prude, MD or one of the following Advanced Practice Providers on your designated Care Team:   Francis Dowse, [place] Casimiro Needle "Mardelle Matte" Tillery, PA-C\   Christena Deem- Long Term Monitor Instructions  Your physician has requested you wear a ZIO patch monitor for  3. days.  This is a single patch monitor. [employer] supplies one patch monitor per enrollment. Additional stickers are not available. Please do not apply patch if you will be having a Nuclear Stress Test,  Echocardiogram, Cardiac CT, MRI, or Chest Xray during the period you would be wearing the  monitor. The patch cannot be worn during these tests. You cannot remove and re-apply the  ZIO XT patch monitor.  Your ZIO patch monitor will be mailed 3 day USPS to your address on file. It may take 3-5 days  to receive your monitor after you have been enrolled.  Once you have received your monitor, please review the enclosed instructions. Your monitor  has already been registered assigning a specific monitor serial # to you.  Billing and Patient Assistance Program Information  We have supplied [employer] with any of your insurance information on file for billing purposes. [employer] offers a sliding scale Patient Assistance Program for patients that do not have  insurance, or whose insurance does not completely cover the cost of the ZIO monitor.  You must apply for the Patient Assistance Program to qualify for this discounted rate.  To apply, please call [employer] at [phone], select option 4, select option 2, ask to apply for  Patient Assistance Program. Meredeth Ide will ask your household income, and how many people  are in your household. They will quote your out-of-pocket cost based on that information.  [employer] will also be able to set up a 65-month, interest-free payment plan if needed.  Applying the monitor   Shave  hair from upper left chest.  Hold abrader disc by orange tab. Rub abrader in 40 strokes over the upper left chest as  indicated in your monitor instructions.  Clean area with 4 enclosed alcohol pads. Let dry.  Apply patch as indicated in monitor instructions. Patch will be placed under collarbone on left  side of chest with arrow pointing upward.  Rub patch adhesive wings for 2 minutes. Remove white label marked "1". Remove the white  label marked "2". Rub patch adhesive wings for 2 additional minutes.  While looking in a mirror, press and release button in center of patch. A small green light will  flash 3-4 times. This will be your only indicator that the monitor has been turned on.  Do not shower for the first 24 hours. You may shower after the first 24 hours.  Press the button if you feel a symptom. You will hear a small click. Record Date, Time and  Symptom in the Patient Logbook.  When you are ready to remove the patch, follow instructions on the last 2 pages of Patient  Logbook. Stick patch monitor onto the last page of Patient Logbook.  Place Patient Logbook in the blue and white box. Use locking tab on box and tape box closed  securely. The blue and white box has prepaid postage on it. Please place it in the mailbox as  soon as possible. Your physician should have your test results approximately 7 days after the  monitor has been mailed back to [facility].  Call [facility] Customer Care at [phone] if you have questions regarding  your ZIO XT patch monitor. Call them immediately if you see an orange light blinking on your  monitor.  If your monitor falls off in less than 4 days, contact our Monitor department at [phone].  If your monitor becomes loose or falls off after 4 days call [employer] at [phone] for  suggestions on securing your monitor

## 2022-09-08 NOTE — Progress Notes (Unsigned)
Enrolled for [employer] to mail a ZIO XT long term holter monitor to the patients address on file.   Dr. Lambert to read. 

## 2022-09-09 LAB — BASIC METABOLIC PANEL
BUN/Creatinine Ratio: 13 (ref 10–24)
BUN: 22 mg/dL (ref 8–27)
CO2: 22 mmol/L (ref 20–29)
Calcium: 8.6 mg/dL (ref 8.6–10.2)
Chloride: 105 mmol/L (ref 96–106)
Creatinine, Ser: 1.74 mg/dL — ABNORMAL HIGH (ref 0.76–1.27)
Glucose: 108 mg/dL — ABNORMAL HIGH (ref 70–99)
Potassium: 4.7 mmol/L (ref 3.5–5.2)
Sodium: 140 mmol/L (ref 134–144)
eGFR: 39 mL/min/{1.73_m2} — ABNORMAL LOW (ref >59)

## 2022-09-09 LAB — MAGNESIUM: Magnesium: 2.1 mg/dL (ref 1.6–2.3)

## 2022-09-11 DIAGNOSIS — I493 Ventricular premature depolarization: Secondary | ICD-10-CM

## 2022-09-19 DIAGNOSIS — I493 Ventricular premature depolarization: Secondary | ICD-10-CM | POA: Diagnosis not present

## 2022-09-26 DIAGNOSIS — N1831 Chronic kidney disease, stage 3a: Secondary | ICD-10-CM | POA: Diagnosis not present

## 2022-09-26 DIAGNOSIS — Z6829 Body mass index (BMI) 29.0-29.9, adult: Secondary | ICD-10-CM | POA: Diagnosis not present

## 2022-09-26 DIAGNOSIS — J449 Chronic obstructive pulmonary disease, unspecified: Secondary | ICD-10-CM | POA: Diagnosis not present

## 2022-09-26 DIAGNOSIS — E7800 Pure hypercholesterolemia, unspecified: Secondary | ICD-10-CM | POA: Diagnosis not present

## 2022-09-26 DIAGNOSIS — R7303 Prediabetes: Secondary | ICD-10-CM | POA: Diagnosis not present

## 2022-09-26 DIAGNOSIS — Z789 Other specified health status: Secondary | ICD-10-CM | POA: Diagnosis not present

## 2022-09-26 DIAGNOSIS — I129 Hypertensive chronic kidney disease with stage 1 through stage 4 chronic kidney disease, or unspecified chronic kidney disease: Secondary | ICD-10-CM | POA: Diagnosis not present

## 2022-10-14 NOTE — Progress Notes (Signed)
Remote pacemaker transmission.   

## 2022-10-20 DIAGNOSIS — D692 Other nonthrombocytopenic purpura: Secondary | ICD-10-CM | POA: Diagnosis not present

## 2022-10-20 DIAGNOSIS — D225 Melanocytic nevi of trunk: Secondary | ICD-10-CM | POA: Diagnosis not present

## 2022-10-20 DIAGNOSIS — L821 Other seborrheic keratosis: Secondary | ICD-10-CM | POA: Diagnosis not present

## 2022-10-20 DIAGNOSIS — D1801 Hemangioma of skin and subcutaneous tissue: Secondary | ICD-10-CM | POA: Diagnosis not present

## 2022-10-31 DIAGNOSIS — N390 Urinary tract infection, site not specified: Secondary | ICD-10-CM | POA: Diagnosis not present

## 2022-11-17 ENCOUNTER — Ambulatory Visit: Payer: Medicare Other | Admitting: Cardiovascular Disease

## 2022-11-18 NOTE — Progress Notes (Unsigned)
Cardiology Office Note:   Date:  11/19/2022  NAME:  [name] [name]    MRN: [id number] DOB:  07-03-1940   PCP:  Sigmund Hazel, MD  Cardiologist:  Reatha Harps, MD  Electrophysiologist:  Lanier Prude, MD   Referring MD: Sigmund Hazel, MD   Chief Complaint  Patient presents with   Follow-up    History of Present Illness:   [name] [name] is a 82 y.o. male with a hx of SSS s/ppm, HLD, CAD who presents for follow-up.  He reports he is doing well.  Reports no chest pain or trouble breathing.  Pacemaker is working well.  9.2 years of battery life left.  LDL cholesterol at goal.  No complaints today.  Kidney function is marginal but seems to be within limits.   Problem List 1. COPD -mild obstructive disease 2. HTN 3. Tobacco abuse  4. Bladder dysfunction -self catheterization (4-5 years) 5. 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 HLD -Total chol 105, HDL 60, LDL 31, TG 71 7. Non-obstructive CAD 08/03/2020 -20% LAD -20% LCX   Past Medical History: Past Medical History:  Diagnosis Date   Arthritis    Chronic kidney disease    high creatinine level    Colon polyp March 2010   colonoscopy with Dr Randa Evens   COPD (chronic obstructive pulmonary disease) (HCC)    mild    Elevated fasting glucose    Hepatitis    History of BPH    with obstruction and hydronephrosis and slihtly elevated creatinine, using self catherization, followed by urologist Dr McDiarmid   History of ETT 2012   [name]   History of tobacco use    54 pack yr   HTN (hypertension)    Paroxysmal supraventricular tachycardia    Presence of permanent cardiac pacemaker    Screening for AAA (abdominal aortic aneurysm) 3/16   3cm, nl/early aneurysm, repeat 3 yrs with u/s 3/19    Past Surgical History: Past Surgical History:  Procedure Laterality Date   APPENDECTOMY     LEFT HEART CATH AND CORONARY ANGIOGRAPHY N/A 08/03/2020   Procedure: LEFT HEART CATH AND CORONARY ANGIOGRAPHY;   Surgeon: Kathleene Hazel, MD;  Location: [facility] INVASIVE CV LAB;  Service: Cardiovascular;  Laterality: N/A;   PACEMAKER IMPLANT N/A 03/02/2020   Procedure: PACEMAKER IMPLANT;  Surgeon: Lanier Prude, MD;  Location: [facility] INVASIVE CV LAB;  Service: Cardiovascular;  Laterality: N/A;   REPLACEMENT TOTAL KNEE BILATERAL  [id number]   TOTAL KNEE REVISION Left 10/15/2020   Procedure: REVISION LEFT KNEE REPLACEMENT BEARING;  Surgeon: [name], MD;  Location: [facility] ORS;  Service: Orthopedics;  Laterality: Left;   TRANSURETHRAL RESECTION OF PROSTATE  2010    Current Medications: Current Meds  Medication Sig   amLODipine (NORVASC) 5 MG tablet Take 5 mg by mouth daily.   atorvastatin (LIPITOR) 10 MG tablet Take 10 mg by mouth daily.   ciprofloxacin (CIPRO) 500 MG tablet Take 500 mg by mouth 2 (two) times daily. PRN for UTI's   DULERA 100-5 MCG/ACT AERO INHALE 2 PUFFS BY MOUTH TWICE DAILY (IN THE MORNING AND AT BEDTIME)   losartan (COZAAR) 100 MG tablet Take 100 mg by mouth daily.   trimethoprim (TRIMPEX) 100 MG tablet Take 100 mg by mouth daily.     Allergies:    Nsaids   Social History: Social History   Socioeconomic History   Marital status: Married    Spouse name: Not on file  Number of children: Not on file   Years of education: Not on file   Highest education level: Not on file  Occupational History   Occupation: retired  Tobacco Use   Smoking status: Former    Packs/day: 3.00    Years: 30.00    Additional pack years: 0.00    Total pack years: 90.00    Types: Cigarettes    Quit date: 06/02/1985    Years since quitting: 37.4   Smokeless tobacco: Never  Vaping Use   Vaping Use: Never used  Substance and Sexual Activity   Alcohol use: Yes    Alcohol/week: 0.0 standard drinks of alcohol    Comment: daily wine, occasional vodka   Drug use: Never   Sexual activity: Not on file  Other Topics Concern   Not on file  Social History Narrative   Caffeine: yes   No diet    Exercise: yes   Married to [place]   2 children   Retired - Education officer, environmental, with extensive traveling   Social Determinants of Corporate investment banker Strain: Not on [employer] Insecurity: Not on file  Transportation Needs: Not on file  Physical Activity: Not on file  Stress: Not on file  Social Connections: Not on file     Family History: The patient's family history includes Healthy in his brother and brother; Kidney disease in his father. There is no history of Colon cancer or Colon polyps.  ROS:   All other ROS reviewed and negative. Pertinent positives noted in the HPI.     EKGs/Labs/Other Studies Reviewed:   The following studies were personally reviewed by me today:  Recent Labs: 09/08/2022: BUN 22; Creatinine, Ser 1.74; Magnesium 2.1; Potassium 4.7; Sodium 140   Recent Lipid Panel    Component Value Date/Time   CHOL 106 08/03/2020 0853   TRIG 50 08/03/2020 0853   HDL 58 08/03/2020 0853   CHOLHDL 1.8 08/03/2020 0853   VLDL 10 08/03/2020 0853   LDLCALC 38 08/03/2020 0853    Physical Exam:   VS:  BP 132/80   Pulse 77   Ht 6\' 1"  (1.854 m)   Wt 210 lb (95.3 kg)   SpO2 96%   BMI 27.71 kg/m    Wt Readings from Last 3 Encounters:  11/19/22 210 lb (95.3 kg)  09/08/22 213 lb 9.6 oz (96.9 kg)  07/28/22 214 lb 12.8 oz (97.4 kg)    General: Well nourished, well developed, in no acute distress Head: Atraumatic, normal size  Eyes: PEERLA, EOMI  Neck: Supple, no JVD Endocrine: No thryomegaly Cardiac: Normal S1, S2; RRR; no murmurs, rubs, or gallops Lungs: Clear to auscultation bilaterally, no wheezing, rhonchi or rales  Abd: Soft, nontender, no hepatomegaly  Ext: No edema, pulses 2+ Musculoskeletal: No deformities, BUE and BLE strength normal and equal Skin: Warm and dry, no rashes   Neuro: Alert and oriented to person, place, time, and situation, CNII-XII grossly intact, no focal deficits  Psych: Normal mood and affect   ASSESSMENT:   [name] [name]  is a 82 y.o. male who presents for the following: 1. Coronary artery disease involving native coronary artery of native heart without angina pectoris   2. Mixed hyperlipidemia   3. SSS (sick sinus syndrome) (HCC)   4. Cardiac pacemaker in situ     PLAN:   1. Coronary artery disease involving native coronary artery of native heart without angina pectoris 2. Mixed hyperlipidemia -Nonobstructive CAD.  No symptoms of angina.  Normal LV function.  On Lipitor.  LDL is at goal.  3. SSS (sick sinus syndrome) (HCC) 4. Cardiac pacemaker in situ -High-grade conduction disease.  Status post pacemaker.  Working well.  No syncope.  Doing well.  Yearly follow-up.  Disposition: Return in about 1 year (around 11/19/2023).  Medication Adjustments/Labs and Tests Ordered: Current medicines are reviewed at length with the patient today.  Concerns regarding medicines are outlined above.  No orders of the defined types were placed in this encounter.  No orders of the defined types were placed in this encounter.  Patient Instructions  Medication Instructions:  The current medical regimen is effective;  continue present plan and medications.  *If you need a refill on your cardiac medications before your next appointment, please call your pharmacy*   Follow-Up: At [facility], you and your health needs are our priority.  As part of our continuing mission to provide you with exceptional heart care, we have created designated Provider Care Teams.  These Care Teams include your primary Cardiologist (physician) and Advanced Practice Providers (APPs -  Physician Assistants and Nurse Practitioners) who all work together to provide you with the care you need, when you need it.  We recommend signing up for the patient portal called "MyChart".  Sign up information is provided on this After Visit Summary.  MyChart is used to connect with patients for Virtual Visits (Telemedicine).  Patients are able to view  lab/test results, encounter notes, upcoming appointments, etc.  Non-urgent messages can be sent to your provider as well.   To learn more about what you can do with MyChart, go to [email or web address].    Your next appointment:   12 month(s)  Provider:   Reatha Harps, MD        Time Spent with Patient: I have spent a total of 25 minutes with patient reviewing hospital notes, telemetry, EKGs, labs and examining the patient as well as establishing an assessment and plan that was discussed with the patient.  > 50% of time was spent in direct patient care.  Signed, Lenna Gilford. Flora Lipps, MD, [facility]  [facility]  [address] [place], [place] [address] [phone]  11/19/2022 3:28 PM

## 2022-11-19 ENCOUNTER — Encounter: Payer: Self-pay | Admitting: Cardiovascular Disease

## 2022-11-19 ENCOUNTER — Ambulatory Visit: Payer: Medicare Other | Attending: Cardiovascular Disease | Admitting: Cardiovascular Disease

## 2022-11-19 VITALS — BP 132/80 | HR 77 | Ht 73.0 in | Wt 210.0 lb

## 2022-11-19 DIAGNOSIS — Z950 Presence of cardiac pacemaker: Secondary | ICD-10-CM | POA: Diagnosis not present

## 2022-11-19 DIAGNOSIS — I2510 Atherosclerotic heart disease of native coronary artery without angina pectoris: Secondary | ICD-10-CM | POA: Insufficient documentation

## 2022-11-19 DIAGNOSIS — E782 Mixed hyperlipidemia: Secondary | ICD-10-CM | POA: Diagnosis not present

## 2022-11-19 DIAGNOSIS — I495 Sick sinus syndrome: Secondary | ICD-10-CM | POA: Insufficient documentation

## 2022-11-19 NOTE — Patient Instructions (Signed)
Medication Instructions:  The current medical regimen is effective;  continue present plan and medications.  *If you need a refill on your cardiac medications before your next appointment, please call your pharmacy*   Follow-Up: At [facility], you and your health needs are our priority.  As part of our continuing mission to provide you with exceptional heart care, we have created designated Provider Care Teams.  These Care Teams include your primary Cardiologist (physician) and Advanced Practice Providers (APPs -  Physician Assistants and Nurse Practitioners) who all work together to provide you with the care you need, when you need it.  We recommend signing up for the patient portal called "MyChart".  Sign up information is provided on this After Visit Summary.  MyChart is used to connect with patients for Virtual Visits (Telemedicine).  Patients are able to view lab/test results, encounter notes, upcoming appointments, etc.  Non-urgent messages can be sent to your provider as well.   To learn more about what you can do with MyChart, go to [email or web address].    Your next appointment:   12 month(s)  Provider:   [place] T O'Neal, MD   

## 2022-12-02 ENCOUNTER — Ambulatory Visit (INDEPENDENT_AMBULATORY_CARE_PROVIDER_SITE_OTHER): Payer: Medicare Other

## 2022-12-02 DIAGNOSIS — I495 Sick sinus syndrome: Secondary | ICD-10-CM | POA: Diagnosis not present

## 2022-12-02 LAB — CUP PACEART REMOTE DEVICE CHECK
Battery Remaining Longevity: 108 mo
Battery Voltage: 3.01 V
Brady Statistic AP VP Percent: 94.72 %
Brady Statistic AP VS Percent: 0.15 %
Brady Statistic AS VP Percent: 4.81 %
Brady Statistic AS VS Percent: 0.31 %
Brady Statistic RA Percent Paced: 95.03 %
Brady Statistic RV Percent Paced: 99.53 %
Date Time Interrogation Session: 20240702091131
Implantable Lead Connection Status: 753985
Implantable Lead Connection Status: 753985
Implantable Lead Implant Date: 20211001
Implantable Lead Implant Date: 20211001
Implantable Lead Location: 753859
Implantable Lead Location: 753860
Implantable Lead Model: 5076
Implantable Lead Model: 5076
Implantable Pulse Generator Implant Date: 20211001
Lead Channel Impedance Value: 304 Ohm
Lead Channel Impedance Value: 437 Ohm
Lead Channel Impedance Value: 456 Ohm
Lead Channel Impedance Value: 494 Ohm
Lead Channel Pacing Threshold Amplitude: 0.5 V
Lead Channel Pacing Threshold Amplitude: 0.625 V
Lead Channel Pacing Threshold Pulse Width: 0.4 ms
Lead Channel Pacing Threshold Pulse Width: 0.4 ms
Lead Channel Sensing Intrinsic Amplitude: 0.75 mV
Lead Channel Sensing Intrinsic Amplitude: 0.75 mV
Lead Channel Sensing Intrinsic Amplitude: 2.375 mV
Lead Channel Sensing Intrinsic Amplitude: 2.375 mV
Lead Channel Setting Pacing Amplitude: 1.5 V
Lead Channel Setting Pacing Amplitude: 2 V
Lead Channel Setting Pacing Pulse Width: 0.4 ms
Lead Channel Setting Sensing Sensitivity: 0.9 mV
Zone Setting Status: 755011
Zone Setting Status: 755011

## 2022-12-26 NOTE — Progress Notes (Signed)
Remote pacemaker transmission.   

## 2023-03-03 ENCOUNTER — Ambulatory Visit: Payer: Medicare Other

## 2023-03-03 DIAGNOSIS — I495 Sick sinus syndrome: Secondary | ICD-10-CM

## 2023-03-04 LAB — CUP PACEART REMOTE DEVICE CHECK
Battery Remaining Longevity: 103 mo
Battery Voltage: 3 V
Brady Statistic AP VP Percent: 93.69 %
Brady Statistic AP VS Percent: 0.34 %
Brady Statistic AS VP Percent: 3.62 %
Brady Statistic AS VS Percent: 2.35 %
Brady Statistic RA Percent Paced: 94.79 %
Brady Statistic RV Percent Paced: 97.31 %
Date Time Interrogation Session: 20241001092113
Implantable Lead Connection Status: 753985
Implantable Lead Connection Status: 753985
Implantable Lead Implant Date: 20211001
Implantable Lead Implant Date: 20211001
Implantable Lead Location: 753859
Implantable Lead Location: 753860
Implantable Lead Model: 5076
Implantable Lead Model: 5076
Implantable Pulse Generator Implant Date: 20211001
Lead Channel Impedance Value: 285 Ohm
Lead Channel Impedance Value: 399 Ohm
Lead Channel Impedance Value: 418 Ohm
Lead Channel Impedance Value: 456 Ohm
Lead Channel Pacing Threshold Amplitude: 0.5 V
Lead Channel Pacing Threshold Amplitude: 0.625 V
Lead Channel Pacing Threshold Pulse Width: 0.4 ms
Lead Channel Pacing Threshold Pulse Width: 0.4 ms
Lead Channel Sensing Intrinsic Amplitude: 1.125 mV
Lead Channel Sensing Intrinsic Amplitude: 1.125 mV
Lead Channel Sensing Intrinsic Amplitude: 2.5 mV
Lead Channel Sensing Intrinsic Amplitude: 2.5 mV
Lead Channel Setting Pacing Amplitude: 1.5 V
Lead Channel Setting Pacing Amplitude: 2 V
Lead Channel Setting Pacing Pulse Width: 0.4 ms
Lead Channel Setting Sensing Sensitivity: 0.9 mV
Zone Setting Status: 755011
Zone Setting Status: 755011

## 2023-03-19 NOTE — Progress Notes (Signed)
Remote pacemaker transmission.   

## 2023-03-26 DIAGNOSIS — Z6828 Body mass index (BMI) 28.0-28.9, adult: Secondary | ICD-10-CM | POA: Diagnosis not present

## 2023-03-26 DIAGNOSIS — J449 Chronic obstructive pulmonary disease, unspecified: Secondary | ICD-10-CM | POA: Diagnosis not present

## 2023-03-26 DIAGNOSIS — Z03818 Encounter for observation for suspected exposure to other biological agents ruled out: Secondary | ICD-10-CM | POA: Diagnosis not present

## 2023-03-26 DIAGNOSIS — R051 Acute cough: Secondary | ICD-10-CM | POA: Diagnosis not present

## 2023-03-31 DIAGNOSIS — I129 Hypertensive chronic kidney disease with stage 1 through stage 4 chronic kidney disease, or unspecified chronic kidney disease: Secondary | ICD-10-CM | POA: Diagnosis not present

## 2023-03-31 DIAGNOSIS — R051 Acute cough: Secondary | ICD-10-CM | POA: Diagnosis not present

## 2023-03-31 DIAGNOSIS — E7800 Pure hypercholesterolemia, unspecified: Secondary | ICD-10-CM | POA: Diagnosis not present

## 2023-03-31 DIAGNOSIS — I700 Atherosclerosis of aorta: Secondary | ICD-10-CM | POA: Diagnosis not present

## 2023-03-31 DIAGNOSIS — N1832 Chronic kidney disease, stage 3b: Secondary | ICD-10-CM | POA: Diagnosis not present

## 2023-03-31 DIAGNOSIS — J449 Chronic obstructive pulmonary disease, unspecified: Secondary | ICD-10-CM | POA: Diagnosis not present

## 2023-03-31 DIAGNOSIS — R7303 Prediabetes: Secondary | ICD-10-CM | POA: Diagnosis not present

## 2023-04-08 ENCOUNTER — Telehealth: Payer: Self-pay | Admitting: Pulmonary Disease

## 2023-04-08 NOTE — Telephone Encounter (Signed)
Spoke with patient regarding prior message.Patient was scheduled with Katie on 04/09/23 at 8:30am .  Patient might need a cxr.  Nothing else further needed.

## 2023-04-08 NOTE — Telephone Encounter (Signed)
PT calling about cough and chest pain. No avail appts. States he did not want to see Dr. Isaiah Serge. Pls call @ [phone]

## 2023-04-09 ENCOUNTER — Ambulatory Visit: Payer: Medicare Other

## 2023-04-09 ENCOUNTER — Encounter: Payer: Self-pay | Admitting: Nurse Practitioner

## 2023-04-09 ENCOUNTER — Ambulatory Visit: Payer: Medicare Other | Admitting: Nurse Practitioner

## 2023-04-09 VITALS — BP 130/74 | HR 87 | Temp 97.8°F | Ht 73.0 in | Wt 205.2 lb

## 2023-04-09 DIAGNOSIS — R0789 Other chest pain: Secondary | ICD-10-CM | POA: Diagnosis not present

## 2023-04-09 DIAGNOSIS — R059 Cough, unspecified: Secondary | ICD-10-CM | POA: Diagnosis not present

## 2023-04-09 DIAGNOSIS — J441 Chronic obstructive pulmonary disease with (acute) exacerbation: Secondary | ICD-10-CM

## 2023-04-09 DIAGNOSIS — J449 Chronic obstructive pulmonary disease, unspecified: Secondary | ICD-10-CM | POA: Insufficient documentation

## 2023-04-09 DIAGNOSIS — J209 Acute bronchitis, unspecified: Secondary | ICD-10-CM | POA: Diagnosis not present

## 2023-04-09 DIAGNOSIS — J440 Chronic obstructive pulmonary disease with acute lower respiratory infection: Secondary | ICD-10-CM | POA: Diagnosis not present

## 2023-04-09 DIAGNOSIS — R918 Other nonspecific abnormal finding of lung field: Secondary | ICD-10-CM | POA: Diagnosis not present

## 2023-04-09 LAB — POCT EXHALED NITRIC OXIDE: FeNO level (ppb): 20

## 2023-04-09 MED ORDER — ALBUTEROL SULFATE HFA 108 (90 BASE) MCG/ACT IN AERS: 2.0000 | INHALATION_SPRAY | Freq: Four times a day (QID) | RESPIRATORY_TRACT | 2 refills | Status: DC | PRN

## 2023-04-09 MED ORDER — HYDROCODONE BIT-HOMATROP MBR 5-1.5 MG/5ML PO SOLN: 5.0000 mL | Freq: Three times a day (TID) | ORAL | 0 refills | Status: DC | PRN

## 2023-04-09 MED ORDER — AZITHROMYCIN 250 MG PO TABS: ORAL_TABLET | ORAL | 0 refills | Status: DC

## 2023-04-09 MED ORDER — PREDNISONE 10 MG PO TABS: ORAL_TABLET | ORAL | 0 refills | Status: DC

## 2023-04-09 MED ORDER — BENZONATATE 200 MG PO CAPS
200.0000 mg | ORAL_CAPSULE | Freq: Three times a day (TID) | ORAL | 1 refills | Status: DC | PRN
Start: 2023-04-09 — End: 2023-04-23

## 2023-04-09 MED ORDER — METHYLPREDNISOLONE ACETATE 80 MG/ML IJ SUSP
80.0000 mg | Freq: Once | INTRAMUSCULAR | Status: AC
Start: 2023-04-09 — End: 2023-04-09
  Administered 2023-04-09: 80 mg via INTRAMUSCULAR

## 2023-04-09 NOTE — Progress Notes (Signed)
@Patient  ID: [name] [name], male    DOB: 08-28-1940, 82 y.o.   MRN: [id number]  Chief Complaint  Patient presents with   Acute Visit    Cough causing chest to hurt x one month.  See telephone contact from yesterday.    Referring provider: Sigmund Hazel, MD  HPI: 82 year old male, former smoker followed for COPD. He is a patient of Dr. Shirlee More or last seen in office 07/28/2022. Past medical history significant for HTN, right BBB, SSS s/p pacemaker.   TEST/EVENTS:   07/28/2022: OV with Dr. Shana Chute on Startex. Has previously tried Anoro. Complains of mild chest tightness and heartburn symptoms which is intermittent in nature. Has taken OTC antiacid without any change. Advised OTC Prilosec and Tums. Pulmonary rehab discussed but deferred.  04/09/2023: Today - acute  Discussed the use of AI scribe software for clinical note transcription with the patient, who gave verbal consent to proceed.  History of Present Illness   The patient, with a history of COPD, presented with a persistent cough that has been ongoing for approximately a month, starting in early October. He initially sought medical attention in late October and was prescribed doxycycline and prednisone by their PCP. The patient reported a temporary improvement in symptoms, but the cough returned. The cough is described as mostly dry, with a slight sensation of wetness in the throat, but no expectoration. The patient also reported a burning sensation in the lungs, particularly when coughing, but also intermittently at rest.  The patient denied any significant dyspnea or wheezing, although he noted a slight wheeze. He reported a possible fever at the onset of the symptoms, but denied any current fever or chills. There was no hemoptysis, leg swelling, or abnormal weight gain. The patient's appetite has remained normal.  The patient has been using Dulera for his COPD for several years and reported no daily difficulties with his  breathing. This is the first time this year he has required steroids and antibiotics. The patient denied any sinus symptoms that could be contributing to the cough.  The patient's COPD has been well-[facility] with [facility], and he has not required a rescue inhaler.  The patient was not experiencing any significant breathing difficulties at the time of the visit.       Allergies  Allergen Reactions   Nsaids     REACTION: abnormal kidney fuction    Immunization History  Administered Date(s) Administered   Fluad Quad(high Dose 65+) 02/01/2020, 03/11/2021   Influenza Split 05/23/2015   Influenza, High Dose Seasonal PF 05/23/2015, 03/06/2017, 04/13/2017   Influenza-Unspecified 03/16/2013   PFIZER(Purple Top)SARS-COV-2 Vaccination 06/14/2019, 07/04/2019, 02/01/2020   Pneumococcal Conjugate-13 08/19/2013   Pneumococcal Polysaccharide-23 04/21/2007, 06/06/2007, 06/02/2013   Td 03/27/2009   Tdap 03/06/2017   Zoster, Live 04/04/2008    Past Medical History:  Diagnosis Date   Arthritis    Chronic kidney disease    high creatinine level    Colon polyp March 2010   colonoscopy with Dr Randa Evens   COPD (chronic obstructive pulmonary disease) (HCC)    mild    Elevated fasting glucose    Hepatitis    History of BPH    with obstruction and hydronephrosis and slihtly elevated creatinine, using self catherization, followed by urologist Dr McDiarmid   History of ETT 2012   [name]   History of tobacco use    54 pack yr   HTN (hypertension)    Paroxysmal supraventricular tachycardia (HCC)    Presence  of permanent cardiac pacemaker    Screening for AAA (abdominal aortic aneurysm) 3/16   3cm, nl/early aneurysm, repeat 3 yrs with u/s 3/19    Tobacco History: Social History   Tobacco Use  Smoking Status Former   Current packs/day: 0.00   Average packs/day: 3.0 packs/day for 30.0 years (90.0 ttl pk-yrs)   Types: Cigarettes   Start date: 06/03/1955   Quit date: 06/02/1985   Years since  quitting: 37.8  Smokeless Tobacco Never   Counseling given: Not Answered   Outpatient Medications Prior to Visit  Medication Sig Dispense Refill   acetaminophen (TYLENOL 8 HOUR) 650 MG CR tablet Take 650 mg by mouth every 8 (eight) hours as needed for pain.     amLODipine (NORVASC) 5 MG tablet Take 5 mg by mouth daily.     atorvastatin (LIPITOR) 10 MG tablet Take 10 mg by mouth daily.     ciprofloxacin (CIPRO) 500 MG tablet Take 500 mg by mouth 2 (two) times daily. PRN for UTI's     DULERA 100-5 MCG/ACT AERO INHALE 2 PUFFS BY MOUTH TWICE DAILY (IN THE MORNING AND AT BEDTIME) 13 g 6   guaiFENesin (MUCINEX) 600 MG 12 hr tablet Take 600 mg by mouth 2 (two) times daily as needed for cough or to loosen phlegm.     losartan (COZAAR) 100 MG tablet Take 100 mg by mouth daily.     trimethoprim (TRIMPEX) 100 MG tablet Take 100 mg by mouth daily.     doxycycline (VIBRA-TABS) 100 MG tablet Take 100 mg by mouth 2 (two) times daily. (Patient not taking: Reported on 04/09/2023)     predniSONE (DELTASONE) 20 MG tablet Take 20 mg by mouth 2 (two) times daily. (Patient not taking: Reported on 04/09/2023)     No facility-administered medications prior to visit.     Review of Systems:   Constitutional: No weight loss or gain, night sweats, fevers, chills, fatigue, or lassitude. HEENT: No headaches, difficulty swallowing, tooth/dental problems, or sore throat. No sneezing, itching, ear ache, nasal congestion, or post nasal drip CV:  No chest pain, orthopnea, PND, swelling in lower extremities, anasarca, dizziness, palpitations, syncope Resp: +cough, paroxysmal at times; chest discomfort with cough; occasional wheeze. No shortness of breath with exertion or at rest. No excess mucus or change in color of mucus. No hemoptysis. No chest wall deformity GI:  No heartburn, indigestion GU: No dysuria, change in color of urine, urgency or frequency.  No flank pain, no hematuria  Skin: No rash, lesions,  ulcerations MSK:  No joint pain or swelling.   Neuro: No dizziness or lightheadedness.  Psych: No depression or anxiety. Mood stable.     Physical Exam:  BP 130/74 (BP Location: Right Arm, Patient Position: Sitting, Cuff Size: Large)   Pulse 87   Temp 97.8 F (36.6 C) (Oral)   Ht 6\' 1"  (1.854 m)   Wt 205 lb 3.2 oz (93.1 kg)   SpO2 96%   BMI 27.07 kg/m   GEN: Pleasant, interactive, well-appearing; elderly; in no acute distress HEENT:  Normocephalic and atraumatic. PERRLA. Sclera white. Nasal turbinates pink, moist and patent bilaterally. No rhinorrhea present. Oropharynx pink and moist, without exudate or edema. No lesions, ulcerations, or postnasal drip.  NECK:  Supple w/ fair ROM. No JVD present. Normal carotid impulses w/o bruits. Thyroid symmetrical with no goiter or nodules palpated. No lymphadenopathy.   CV: RRR, no m/r/g, no peripheral edema. Pulses intact, +2 bilaterally. No cyanosis, pallor or clubbing. PULMONARY:  Unlabored, regular breathing. Scattered rhonchi bilaterally A&P. No accessory muscle use.  GI: BS present and normoactive. Soft, non-tender to palpation. No organomegaly or masses detected.  MSK: No erythema, warmth or tenderness. Cap refil <2 sec all extrem. No deformities or joint swelling noted.  Neuro: A/Ox3. No focal deficits noted.   Skin: Warm, no lesions or rashe Psych: Normal affect and behavior. Judgement and thought content appropriate.     Lab Results:  CBC    Component Value Date/Time   WBC 8.2 10/11/2020 1420   RBC 4.77 10/11/2020 1420   HGB 14.2 10/11/2020 1420   HCT 42.5 10/11/2020 1420   PLT 172 10/11/2020 1420   MCV 89.1 10/11/2020 1420   MCH 29.8 10/11/2020 1420   MCHC 33.4 10/11/2020 1420   RDW 13.5 10/11/2020 1420   LYMPHSABS 4.0 10/11/2020 1420   MONOABS 0.7 10/11/2020 1420   EOSABS 0.2 10/11/2020 1420   BASOSABS 0.0 10/11/2020 1420    BMET    Component Value Date/Time   NA 140 09/08/2022 0848   K 4.7 09/08/2022 0848    CL 105 09/08/2022 0848   CO2 22 09/08/2022 0848   GLUCOSE 108 (H) 09/08/2022 0848   GLUCOSE 106 (H) 10/11/2020 1420   BUN 22 09/08/2022 0848   CREATININE 1.74 (H) 09/08/2022 0848   CALCIUM 8.6 09/08/2022 0848   GFRNONAA 33 (L) 10/11/2020 1420   GFRAA >60 02/03/2020 0547    BNP No results found for: "BNP"   Imaging:  No results found.  Administration History     None          Latest Ref Rng & Units 12/01/2019    8:56 AM 08/11/2017   10:53 AM  PFT Results  FVC-Pre L 4.81  4.52   FVC-Predicted Pre % 108  100   FVC-Post L 4.74  4.72   FVC-Predicted Post % 107  105   Pre FEV1/FVC % % 63  57   Post FEV1/FCV % % 64  62   FEV1-Pre L 3.01  2.57   FEV1-Predicted Pre % 95  79   FEV1-Post L 3.03  2.93   DLCO uncorrected ml/min/mmHg 23.57  24.52   DLCO UNC% % 90  70   DLCO corrected ml/min/mmHg 42.97  24.81   DLCO COR %Predicted % 164  70   DLVA Predicted % 135  62   TLC L 9.46  10.61   TLC % Predicted % 126  142   RV % Predicted % 157  205     No results found for: "NITRICOXIDE"      Assessment & Plan:      Acute bronchitis with COPD Slow to resolve bronchitic illness. Persistent cough for one month, initially treated with doxycycline and prednisone with temporary improvement. Cough has returned with mild burning sensation in the lungs and occasional wheezing. No significant sputum production, fever, chills, hemoptysis, or weight gain. Chest x-ray today to evaluate for superimposed infection. Likely post-viral bronchitis with ongoing inflammation. Discussed adding azithromycin (Z-Pak) for atypical pathogens. Explained steroid injection, prednisone taper and cough medications aim to reduce inflammation and control symptoms. Advised on side effect profile of medications. Understands to not drive after taking cough syrup.  - Administer depo 80 mg inj x 1 in office - Prescribe azithromycin (Z-Pak) - Prescribe hycodan cough syrup for as needed use. Safety profile  reviewed. - Prescribe Tessalon Perles (benzonatate) every 8 hours for a few days, then as needed - Prednisone taper - Continue Dulera for management.  Perform oral hygiene after use.  - Prescribe albuterol rescue inhaler for use as needed (two puffs every six hours for breathing or wheezing). Educated on proper use/side effect profile - Consider stepping up inhaler therapy if symptoms persist or worsen  - Follow-up in two weeks - Advise to go to the emergency room if breathing problems occur   Follow-up - Follow-up appointment in two weeks - Call if symptoms do not improve or worsen significantly.       Advised if symptoms do not improve or worsen, to please contact office for sooner follow up or seek emergency care.   I spent 35 minutes of dedicated to the care of this patient on the date of this encounter to include pre-visit review of records, face-to-face time with the patient discussing conditions above, post visit ordering of testing, clinical documentation with the electronic health record, making appropriate referrals as documented, and communicating necessary findings to members of the patients care team.  Noemi Chapel, NP 04/09/2023  Pt aware and understands NP's role.

## 2023-04-09 NOTE — Patient Instructions (Addendum)
Continue Dulera 2 puffs Twice daily. Brush tongue and rinse mouth afterwards Albuterol inhaler 2 puffs every 6 hours as needed for shortness of breath or wheezing. Notify if symptoms persist despite rescue inhaler/neb use.   -Hycodan cough syrup 5 mL every 8 hours as needed for cough. May cause drowsiness. Do not drive after taking. Be cautious when you get up to move as this may cause you to be more unsteady -Benzonatate 1 capsule Three times a day for cough. Use consistently over the next few days then as needed -Prednisone taper. 4 tabs for 3 days, then 3 tabs for 3 days, 2 tabs for 3 days, then 1 tab for 3 days, then stop. Take in AM with food. Start tomorrow -Z pack - take 2 tablets on day one then 1 tablet daily for 4 days. Take with food. Do not take your prescription for ciprofloxacin while on the  zpack   Follow up in two weeks with Dr. Isaiah Serge or Katie Marnell Mcdaniel,NP. Ok to double book [facility] on a day not already overbooked if no availability with either provider. If symptoms do not improve or worsen, please contact office for sooner follow up or seek emergency care.

## 2023-04-23 ENCOUNTER — Ambulatory Visit: Payer: Medicare Other | Admitting: Nurse Practitioner

## 2023-04-23 ENCOUNTER — Encounter: Payer: Self-pay | Admitting: Nurse Practitioner

## 2023-04-23 VITALS — BP 130/88 | HR 75 | Ht 72.0 in | Wt 203.6 lb

## 2023-04-23 DIAGNOSIS — J449 Chronic obstructive pulmonary disease, unspecified: Secondary | ICD-10-CM | POA: Diagnosis not present

## 2023-04-23 DIAGNOSIS — J189 Pneumonia, unspecified organism: Secondary | ICD-10-CM | POA: Insufficient documentation

## 2023-04-23 NOTE — Patient Instructions (Addendum)
Continue Dulera 2 puffs Twice daily. Brush tongue and rinse mouth afterwards Continue Albuterol inhaler 2 puffs every 6 hours as needed for shortness of breath or wheezing. Notify if symptoms persist despite rescue inhaler/neb use.    Glad you are feeling better!  Repeat chest x ray in 4 weeks    Follow up in four weeks with Dr. Isaiah Serge or Katie Axel Frisk,NP. Ok to double book [facility] on a day not already overbooked if no availability with either provider. CXR beforehand. If symptoms do not improve or worsen, please contact office for sooner follow up or seek emergency care.

## 2023-04-23 NOTE — Assessment & Plan Note (Addendum)
Resolved exacerbation. Clinically improved. Minimal symptom burden at baseline. Continue current regimen. If he has another exacerbation, recommend step up to triple therapy. Action plan in place. Encouraged to remain active.

## 2023-04-23 NOTE — Assessment & Plan Note (Signed)
Paroxysmal cough with pleuritic pain. CXR revealed patchy opacities in lingula. He was treated with empiric doxycycline and azithromycin. Clinically improved. Will repeat CXR in 4 weeks to ensure resolution. Strict return precautions reviewed.  Patient Instructions  Continue Dulera 2 puffs Twice daily. Brush tongue and rinse mouth afterwards Continue Albuterol inhaler 2 puffs every 6 hours as needed for shortness of breath or wheezing. Notify if symptoms persist despite rescue inhaler/neb use.    Glad you are feeling better!  Repeat chest x ray in 4 weeks    Follow up in four weeks with Dr. Isaiah Serge or Katie Lee-Ann Gal,NP. Ok to double book [facility] on a day not already overbooked if no availability with either provider. CXR beforehand. If symptoms do not improve or worsen, please contact office for sooner follow up or seek emergency care.

## 2023-04-23 NOTE — Progress Notes (Signed)
@Patient  ID: [name] [name], male    DOB: [birth year], 82 y.o.   MRN: [id number]  Chief Complaint  Patient presents with   Follow-up    Acute bronchitis with COPD F/U visit.    Referring provider: Sigmund Hazel, MD  HPI: 82 year old male, former smoker followed for COPD. He is a patient of Dr. Shirlee More or last seen in office 04/09/2023 by Allison Quarry NP. Past medical history significant for HTN, right BBB, SSS s/p pacemaker.   TEST/EVENTS:  12/01/2019 PFT: FVC 108, FEV1 95, ratio 64, TLC 126, DLCO 90 04/09/2023 CXR: new patchy opacities in lingula, suspicious for pneumonia. Linear opacities in lung bases, likely atelectasis.   07/28/2022: OV with Dr. Shana Chute on [place]. Has previously tried Anoro. Complains of mild chest tightness and heartburn symptoms which is intermittent in nature. Has taken OTC antiacid without any change. Advised OTC Prilosec and Tums. Pulmonary rehab discussed but deferred.  04/09/2023: Ov with Ravinder Lukehart NP The patient, with a history of COPD, presented with a persistent cough that has been ongoing for approximately a month, starting in early October. He initially sought medical attention in late October and was prescribed doxycycline and prednisone by their PCP. The patient reported a temporary improvement in symptoms, but the cough returned. The cough is described as mostly dry, with a slight sensation of wetness in the throat, but no expectoration. The patient also reported a burning sensation in the lungs, particularly when coughing, but also intermittently at rest.   The patient denied any significant dyspnea or wheezing, although he noted a slight wheeze. He reported a possible fever at the onset of the symptoms, but denied any current fever or chills. There was no hemoptysis, leg swelling, or abnormal weight gain. The patient's appetite has remained normal.   The patient has been using Dulera for his COPD for several years and reported no daily difficulties with his  breathing. This is the first time this year he has required steroids and antibiotics. The patient denied any sinus symptoms that could be contributing to the cough.   The patient's COPD has been well-managed with [facility], and he has not required a rescue inhaler.  The patient was not experiencing any significant breathing difficulties at the time of the visit.   04/23/2023: Today - follow up Patient presents today for follow up. He's feeling much better. Cough is resolved. Not noticing any wheezing. Breathing feels like it's back to his normal. He's not had to use his rescue inhaler. Completed his steroids and antibiotics. Eating and drinking well. No fevers, chills, hemoptysis. Using [facility] twice a day.   Allergies  Allergen Reactions   Nsaids     REACTION: abnormal kidney fuction    Immunization History  Administered Date(s) Administered   Fluad Quad(high Dose 65+) 02/01/2020, 03/11/2021   Influenza Split 05/23/2015   Influenza, High Dose Seasonal PF 05/23/2015, 03/06/2017, 04/13/2017   Influenza-Unspecified 03/16/2013   PFIZER(Purple Top)SARS-COV-2 Vaccination 06/14/2019, 07/04/2019, 02/01/2020   Pneumococcal Conjugate-13 08/19/2013   Pneumococcal Polysaccharide-23 04/21/2007, 06/06/2007, 06/02/2013   Td 03/27/2009   Tdap 03/06/2017   Zoster, Live 04/04/2008    Past Medical History:  Diagnosis Date   Arthritis    Chronic kidney disease    high creatinine level    Colon polyp March 2010   colonoscopy with Dr Randa Evens   COPD (chronic obstructive pulmonary disease) (HCC)    mild    Elevated fasting glucose    Hepatitis    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, using self catherization, followed by urologist Dr McDiarmid   History of ETT 2012   [name]   History of tobacco use    54 pack yr   HTN (hypertension)    Paroxysmal supraventricular tachycardia (HCC)    Presence of permanent cardiac pacemaker    Screening for AAA  (abdominal aortic aneurysm) 3/16   3cm, nl/early aneurysm, repeat 3 yrs with u/s 3/19    Tobacco History: Social History   Tobacco Use  Smoking Status Former   Current packs/day: 0.00   Average packs/day: 3.0 packs/day for 30.0 years (90.0 ttl pk-yrs)   Types: Cigarettes   Start date: 06/03/1955   Quit date: 06/02/1985   Years since quitting: 37.9  Smokeless Tobacco Never   Counseling given: Not Answered   Outpatient Medications Prior to Visit  Medication Sig Dispense Refill   acetaminophen (TYLENOL 8 HOUR) 650 MG CR tablet Take 650 mg by mouth every 8 (eight) hours as needed for pain.     albuterol (VENTOLIN HFA) 108 (90 Base) MCG/ACT inhaler Inhale 2 puffs into the lungs every 6 (six) hours as needed for wheezing or shortness of breath. 8 g 2   amLODipine (NORVASC) 5 MG tablet Take 5 mg by mouth daily.     atorvastatin (LIPITOR) 10 MG tablet Take 10 mg by mouth daily.     DULERA 100-5 MCG/ACT AERO INHALE 2 PUFFS BY MOUTH TWICE DAILY (IN THE MORNING AND AT BEDTIME) 13 g 6   guaiFENesin (MUCINEX) 600 MG 12 hr tablet Take 600 mg by mouth 2 (two) times daily as needed for cough or to loosen phlegm.     HYDROcodone bit-homatropine (HYCODAN) 5-1.5 MG/5ML syrup Take 5 mLs by mouth every 8 (eight) hours as needed for cough. 120 mL 0   losartan (COZAAR) 100 MG tablet Take 100 mg by mouth daily.     trimethoprim (TRIMPEX) 100 MG tablet Take 100 mg by mouth daily.     azithromycin (ZITHROMAX) 250 MG tablet Take 2 tabs on day one then 1 tab daily for four additional days 6 tablet 0   benzonatate (TESSALON) 200 MG capsule Take 1 capsule (200 mg total) by mouth 3 (three) times daily as needed for cough. 30 capsule 1   ciprofloxacin (CIPRO) 500 MG tablet Take 500 mg by mouth 2 (two) times daily. PRN for UTI's     predniSONE (DELTASONE) 10 MG tablet 4 tabs for 3 days, then 3 tabs for 3 days, 2 tabs for 3 days, then 1 tab for 3 days, then stop 30 tablet 0   No facility-administered medications prior  to visit.     Review of Systems:   Constitutional: No weight loss or gain, night sweats, fevers, chills, fatigue, or lassitude. HEENT: No headaches, difficulty swallowing, tooth/dental problems, or sore throat. No sneezing, itching, ear ache, nasal congestion, or post nasal drip CV:  No chest pain, orthopnea, PND, swelling in lower extremities, anasarca, dizziness, palpitations, syncope Resp: No cough. No wheeze. No shortness of breath with exertion or at rest. No excess mucus or change in color of mucus. No hemoptysis. No chest wall deformity GI:  No heartburn, indigestion GU: No dysuria, change in color of urine, urgency or frequency.  No flank pain, no hematuria  Skin: No rash, lesions, ulcerations MSK:  No joint pain or swelling.   Neuro: No dizziness or lightheadedness.  Psych: No depression or anxiety. Mood stable.     Physical Exam:  BP 130/88 (BP Location: Right Arm, Cuff Size: Large)   Pulse 75   Ht 6' (1.829 m)   Wt 203 lb 9.6 oz (92.4 kg)   SpO2 95%   BMI 27.61 kg/m   GEN: Pleasant, interactive, well-appearing; elderly; in no acute distress HEENT:  Normocephalic and atraumatic. PERRLA. Sclera white. Nasal turbinates pink, moist and patent bilaterally. No rhinorrhea present. Oropharynx pink and moist, without exudate or edema. No lesions, ulcerations, or postnasal drip.  NECK:  Supple w/ fair ROM. No JVD present. Normal carotid impulses w/o bruits. Thyroid symmetrical with no goiter or nodules palpated. No lymphadenopathy.   CV: RRR, no m/r/g, no peripheral edema. Pulses intact, +2 bilaterally. No cyanosis, pallor or clubbing. PULMONARY:  Unlabored, regular breathing. Clear bilaterally A&P w/o wheezes/rales/rhonchi. No accessory muscle use.  GI: BS present and normoactive. Soft, non-tender to palpation. No organomegaly or masses detected.  MSK: No erythema, warmth or tenderness. Cap refil <2 sec all extrem. No deformities or joint swelling noted.  Neuro: A/Ox3. No focal  deficits noted.   Skin: Warm, no lesions or rashe Psych: Normal affect and behavior. Judgement and thought content appropriate.     Lab Results:  CBC    Component Value Date/Time   WBC 8.2 10/11/2020 1420   RBC 4.77 10/11/2020 1420   HGB 14.2 10/11/2020 1420   HCT 42.5 10/11/2020 1420   PLT 172 10/11/2020 1420   MCV 89.1 10/11/2020 1420   MCH 29.8 10/11/2020 1420   MCHC 33.4 10/11/2020 1420   RDW 13.5 10/11/2020 1420   LYMPHSABS 4.0 10/11/2020 1420   MONOABS 0.7 10/11/2020 1420   EOSABS 0.2 10/11/2020 1420   BASOSABS 0.0 10/11/2020 1420    BMET    Component Value Date/Time   NA 140 09/08/2022 0848   K 4.7 09/08/2022 0848   CL 105 09/08/2022 0848   CO2 22 09/08/2022 0848   GLUCOSE 108 (H) 09/08/2022 0848   GLUCOSE 106 (H) 10/11/2020 1420   BUN 22 09/08/2022 0848   CREATININE 1.74 (H) 09/08/2022 0848   CALCIUM 8.6 09/08/2022 0848   GFRNONAA 33 (L) 10/11/2020 1420   GFRAA >60 02/03/2020 0547    BNP No results found for: "BNP"   Imaging:  DG Chest 2 View  Result Date: 04/09/2023 CLINICAL DATA:  Cough, chest discomfort EXAM: CHEST - 2 VIEW COMPARISON:  Chest radiograph 07/28/2022 FINDINGS: The left chest wall cardiac device is stable. The cardiomediastinal silhouette is stable There are new patchy opacities in the lingula. Linear opacities in the lung bases on the lateral projection likely reflect subsegmental atelectasis. There is no other focal airspace opacity. There is no pulmonary edema. There is no pleural effusion or pneumothorax There is no acute osseous abnormality. IMPRESSION: Patchy opacities in the lingula suspicious for pneumonia in the correct clinical setting. Recommend follow-up radiographs in 6-8 weeks to ensure resolution. Electronically Signed   By: Lesia Hausen M.D.   On: 04/09/2023 09:23    methylPREDNISolone acetate (DEPO-MEDROL) injection 80 mg     Date Action Dose Route User   04/09/2023 0932 Given 80 mg Intramuscular (Right Ventrogluteal)  Franco Nones, CMA          Latest Ref Rng & Units 12/01/2019    8:56 AM 08/11/2017   10:53 AM  PFT Results  FVC-Pre L 4.81  4.52   FVC-Predicted Pre % 108  100   FVC-Post L 4.74  4.72   FVC-Predicted Post % 107  105   Pre FEV1/FVC % %  63  57   Post FEV1/FCV % % 64  62   FEV1-Pre L 3.01  2.57   FEV1-Predicted Pre % 95  79   FEV1-Post L 3.03  2.93   DLCO uncorrected ml/min/mmHg 23.57  24.52   DLCO UNC% % 90  70   DLCO corrected ml/min/mmHg 42.97  24.81   DLCO COR %Predicted % 164  70   DLVA Predicted % 135  62   TLC L 9.46  10.61   TLC % Predicted % 126  142   RV % Predicted % 157  205     No results found for: "NITRICOXIDE"      Assessment & Plan:   CAP (community acquired pneumonia) Paroxysmal cough with pleuritic pain. CXR revealed patchy opacities in lingula. He was treated with empiric doxycycline and azithromycin. Clinically improved. Will repeat CXR in 4 weeks to ensure resolution. Strict return precautions reviewed.  Patient Instructions  Continue Dulera 2 puffs Twice daily. Brush tongue and rinse mouth afterwards Continue Albuterol inhaler 2 puffs every 6 hours as needed for shortness of breath or wheezing. Notify if symptoms persist despite rescue inhaler/neb use.    Glad you are feeling better!  Repeat chest x ray in 4 weeks    Follow up in four weeks with Dr. Isaiah Serge or Katie Alanzo Lamb,NP. Ok to double book [place] on a day not already overbooked if no availability with either provider. CXR beforehand. If symptoms do not improve or worsen, please contact office for sooner follow up or seek emergency care.    COPD, group B, by GOLD 2017 classification (HCC) Resolved exacerbation. Clinically improved. Minimal symptom burden at baseline. Continue current regimen. If he has another exacerbation, recommend step up to triple therapy. Action plan in place. Encouraged to remain active.    Advised if symptoms do not improve or worsen, to please contact office for sooner  follow up or seek emergency care.   I spent 25 minutes of dedicated to the care of this patient on the date of this encounter to include pre-visit review of records, face-to-face time with the patient discussing conditions above, post visit ordering of testing, clinical documentation with the electronic health record, making appropriate referrals as documented, and communicating necessary findings to members of the patients care team.  Noemi Chapel, NP 04/23/2023  Pt aware and understands NP's role.

## 2023-05-20 DIAGNOSIS — K4090 Unilateral inguinal hernia, without obstruction or gangrene, not specified as recurrent: Secondary | ICD-10-CM | POA: Diagnosis not present

## 2023-05-20 DIAGNOSIS — Z6828 Body mass index (BMI) 28.0-28.9, adult: Secondary | ICD-10-CM | POA: Diagnosis not present

## 2023-05-28 DIAGNOSIS — K4090 Unilateral inguinal hernia, without obstruction or gangrene, not specified as recurrent: Secondary | ICD-10-CM | POA: Diagnosis not present

## 2023-05-28 NOTE — Consult Note (Signed)
I called Mr. [name] [name] at Dr. Marvetta Gibbons request after reviewing his chart.  He had some questions regarding upcomming Open Inguinal hernia repair.  He has a history of COPD with a bout of mild pneumonia over one month ago that has resolved with no further issues.  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  Pacemaker with h/o SVT.  Dr Bedelia Person is good with GAET or Spinal.  I think the patient would be a suitable candidate for GAET.   I reassured him and answered all questions.

## 2023-05-29 ENCOUNTER — Encounter: Payer: Self-pay | Admitting: Cardiology

## 2023-05-29 ENCOUNTER — Telehealth: Payer: Self-pay | Admitting: *Deleted

## 2023-05-29 NOTE — Progress Notes (Signed)
PERIOPERATIVE PRESCRIPTION FOR IMPLANTED CARDIAC DEVICE PROGRAMMING  Patient Information: Name:  [name] [name]  DOB:  [birth year]  MRN:  [id number]  Procedure:  INGUINAL HERNIA SURGERY   Date of Surgery:  Clearance TBD                                  Surgeon:  DR. Kris Mouton Surgeon's Group or Practice Name:  [facility] Phone number:  [phone] Fax number:  [phone] ATTN: Santiago Glad, CMA   Type of Clearance Requested:   - Medical ; NONE INDICATED   Type of Anesthesia:   CHOICE Device Information:  Clinic EP Physician:  Dr. Steffanie Dunn  Device Type:  Pacemaker Manufacturer and Phone #:  Medtronic: [id number] Pacemaker Dependent?:  No. Date of Last Device Check:  03/03/2023 Normal Device Function?:  Yes.    Electrophysiologist's Recommendations:  Have magnet available. Provide continuous ECG monitoring when magnet is used or reprogramming is to be performed.  Procedure may interfere with device function.  Magnet should be placed over device during procedure.  Per Device Clinic Standing Orders, Wiliam Ke, RN  1:24 PM 05/29/2023

## 2023-05-29 NOTE — Telephone Encounter (Signed)
Device clearance sent.

## 2023-05-29 NOTE — Telephone Encounter (Signed)
   Name: [name] [name]  DOB: [birth year]  MRN: [id number]  Primary Cardiologist: Reatha Harps, MD  Preoperative team, please contact this patient and set up a phone call appointment for further preoperative risk assessment. Please obtain consent and complete medication review. Thank you for your help.  I confirm that guidance regarding antiplatelet and oral anticoagulation therapy has been completed and, if necessary, noted below. None requested.   I also confirmed the patient resides in the state of [name]. As per [facility] Medical Board telemedicine laws, the patient must reside in the state in which the provider is licensed.  Rip Harbour, NP 05/29/2023, 1:10 PM [facility]

## 2023-05-29 NOTE — Telephone Encounter (Signed)
Pt has been scheduled tele preop appt 06/12/23. Pt states surgery will not be scheduled until he has been cleared. Med rec and consent are done.     Patient Consent for Virtual Visit        DETAVIOUS [name] has provided verbal consent on 05/29/2023 for a virtual visit (video or telephone).   CONSENT FOR VIRTUAL VISIT FOR:  [name] [name]  By participating in this virtual visit I agree to the following:  I hereby voluntarily request, consent and authorize [facility] and its employed or contracted physicians, physician assistants, nurse practitioners or other licensed health care professionals (the Practitioner), to provide me with telemedicine health care services (the "Services") as deemed necessary by the treating Practitioner. I acknowledge and consent to receive the Services by the Practitioner via telemedicine. I understand that the telemedicine visit will involve communicating with the Practitioner through live audiovisual communication technology and the disclosure of certain medical information by electronic transmission. I acknowledge that I have been given the opportunity to request an in-person assessment or other available alternative prior to the telemedicine visit and am voluntarily participating in the telemedicine visit.  I understand that I have the right to withhold or withdraw my consent to the use of telemedicine in the course of my care at any time, without affecting my right to future care or treatment, and that the Practitioner or I may terminate the telemedicine visit at any time. I understand that I have the right to inspect all information obtained and/or recorded in the course of the telemedicine visit and may receive copies of available information for a reasonable fee.  I understand that some of the potential risks of receiving the Services via telemedicine include:  Delay or interruption in medical evaluation due to technological equipment failure or  disruption; Information transmitted may not be sufficient (e.g. poor resolution of images) to allow for appropriate medical decision making by the Practitioner; and/or  In rare instances, security protocols could fail, causing a breach of personal health information.  Furthermore, I acknowledge that it is my responsibility to provide information about my medical history, conditions and care that is complete and accurate to the best of my ability. I acknowledge that Practitioner's advice, recommendations, and/or decision may be based on factors not within their control, such as incomplete or inaccurate data provided by me or distortions of diagnostic images or specimens that may result from electronic transmissions. I understand that the practice of medicine is not an exact science and that Practitioner makes no warranties or guarantees regarding treatment outcomes. I acknowledge that a copy of this consent can be made available to me via my patient portal [facility] MyChart), or I can request a printed copy by calling the office of [facility].    I understand that my insurance will be billed for this visit.   I have read or had this consent read to me. I understand the contents of this consent, which adequately explains the benefits and risks of the Services being provided via telemedicine.  I have been provided ample opportunity to ask questions regarding this consent and the Services and have had my questions answered to my satisfaction. I give my informed consent for the services to be provided through the use of telemedicine in my medical care

## 2023-05-29 NOTE — Telephone Encounter (Signed)
   Pre-operative Risk Assessment    Patient Name: [name] [name]  DOB: 03/07/1941 MRN: [id number]   Date of last office visit: 11/19/22 DR. O'NEAL Date of next office visit: NONE   Request for Surgical Clearance    Procedure:  INGUINAL HERNIA SURGERY  Date of Surgery:  Clearance TBD                                Surgeon:  DR. Kris Mouton Surgeon's Group or Practice Name:  [facility] Phone number:  [phone] Fax number:  [phone] ATTN: Santiago Glad, CMA   Type of Clearance Requested:   - Medical ; NONE INDICATED   Type of Anesthesia:   CHOICE   Additional requests/questions:    Elpidio Anis   05/29/2023, 1:01 PM

## 2023-05-29 NOTE — Telephone Encounter (Signed)
Pt has been scheduled tele preop appt 06/12/23. Pt states surgery will not be scheduled until he has been cleared. Med rec and consent are done.

## 2023-06-02 ENCOUNTER — Ambulatory Visit (INDEPENDENT_AMBULATORY_CARE_PROVIDER_SITE_OTHER): Payer: Medicare Other

## 2023-06-02 DIAGNOSIS — I495 Sick sinus syndrome: Secondary | ICD-10-CM | POA: Diagnosis not present

## 2023-06-02 LAB — CUP PACEART REMOTE DEVICE CHECK
Battery Remaining Longevity: 99 mo
Battery Voltage: 3 V
Brady Statistic AP VP Percent: 96.06 %
Brady Statistic AP VS Percent: 0.12 %
Brady Statistic AS VP Percent: 3.04 %
Brady Statistic AS VS Percent: 0.79 %
Brady Statistic RA Percent Paced: 96.43 %
Brady Statistic RV Percent Paced: 99.1 %
Date Time Interrogation Session: 20241231024805
Implantable Lead Connection Status: 753985
Implantable Lead Connection Status: 753985
Implantable Lead Implant Date: 20211001
Implantable Lead Implant Date: 20211001
Implantable Lead Location: 753859
Implantable Lead Location: 753860
Implantable Lead Model: 5076
Implantable Lead Model: 5076
Implantable Pulse Generator Implant Date: 20211001
Lead Channel Impedance Value: 304 Ohm
Lead Channel Impedance Value: 361 Ohm
Lead Channel Impedance Value: 418 Ohm
Lead Channel Impedance Value: 418 Ohm
Lead Channel Pacing Threshold Amplitude: 0.5 V
Lead Channel Pacing Threshold Amplitude: 0.625 V
Lead Channel Pacing Threshold Pulse Width: 0.4 ms
Lead Channel Pacing Threshold Pulse Width: 0.4 ms
Lead Channel Sensing Intrinsic Amplitude: 0.75 mV
Lead Channel Sensing Intrinsic Amplitude: 0.75 mV
Lead Channel Sensing Intrinsic Amplitude: 2.625 mV
Lead Channel Sensing Intrinsic Amplitude: 2.625 mV
Lead Channel Setting Pacing Amplitude: 1.5 V
Lead Channel Setting Pacing Amplitude: 2 V
Lead Channel Setting Pacing Pulse Width: 0.4 ms
Lead Channel Setting Sensing Sensitivity: 0.9 mV
Zone Setting Status: 755011
Zone Setting Status: 755011

## 2023-06-12 ENCOUNTER — Ambulatory Visit: Payer: Medicare Other | Attending: Cardiology | Admitting: Student

## 2023-06-12 DIAGNOSIS — Z01810 Encounter for preprocedural cardiovascular examination: Secondary | ICD-10-CM

## 2023-06-12 NOTE — Progress Notes (Signed)
 Virtual Visit via Telephone Note   Because of [name] [name]'s co-morbid illnesses, he is at least at moderate risk for complications without adequate follow up.  This format is felt to be most appropriate for this patient at this time.  The patient did not have access to video technology/had technical difficulties with video requiring transitioning to audio format only (telephone).  All issues noted in this document were discussed and addressed.  No physical exam could be performed with this format.  Please refer to the patient's chart for his consent to telehealth for [facility].  Evaluation Performed:  Preoperative cardiovascular risk assessment _____________   Date:  06/12/2023   Patient ID:  [name] [name], DOB Sep 06, 1940, MRN [id number] Patient Location:  Home Provider location:   Office  Primary Care Provider:  Cleotilde Planas, MD Primary Cardiologist:  Darryle ONEIDA Decent, MD  Chief Complaint / Patient Profile   83 y.o. y/o male with a h/o sick sinus syndrome s/p PPM October 2021, nonobstructive CAD per angiography, hypertension, COPD who is pending inguinal hernia repair by Dr. Paola and presents today for telephonic preoperative cardiovascular risk assessment.  History of Present Illness    [name] [name] is a 83 y.o. male who presents via audio/video conferencing for a telehealth visit today.  Pt was last seen in cardiology clinic on 11/19/2022 by Dr. Decent.  At that time Ubaldo BROCKS Hummer was stable from a cardiac standpoint.  The patient is now pending procedure as outlined above. Since his last visit, he is doing well. Patient denies lower extremity edema, orthopnea or PND. He has some shortness of breath at baseline secondary to COPD. No chest pain, pressure, or tightness. No palpitations. He is very active going to the gym to swim or perform other cardio exercises for 40-45 minutes.    Past Medical History    Past Medical History:  Diagnosis Date   Arthritis     Chronic kidney disease    high creatinine level    Colon polyp March 2010   colonoscopy with Dr Celestia   COPD (chronic obstructive pulmonary disease) (HCC)    mild    Elevated fasting glucose    Hepatitis    History of BPH    with obstruction and hydronephrosis and slihtly elevated creatinine, using self catherization, followed by urologist Dr McDiarmid   History of ETT 2012   [name]   History of tobacco use    54 pack yr   HTN (hypertension)    Paroxysmal supraventricular tachycardia (HCC)    Presence of permanent cardiac pacemaker    Screening for AAA (abdominal aortic aneurysm) 3/16   3cm, nl/early aneurysm, repeat 3 yrs with u/s 3/19   Past Surgical History:  Procedure Laterality Date   APPENDECTOMY     LEFT HEART CATH AND CORONARY ANGIOGRAPHY N/A 08/03/2020   Procedure: LEFT HEART CATH AND CORONARY ANGIOGRAPHY;  Surgeon: Verlin Lonni BIRCH, MD;  Location: [facility] IN[facility]IVE [facility];  Service: Cardiovascular;  Laterality: N/A;   PACEMAKER IMPLANT N/A 03/02/2020   Procedure: PACEMAKER IMPLANT;  Surgeon: Cindie Ole ONEIDA, MD;  Location: [facility] INVASIVE CV LAB;  Service: Cardiovascular;  Laterality: N/A;   REPLACEMENT TOTAL KNEE BILATERAL  [id number]   TOTAL KNEE REVISION Left 10/15/2020   Procedure: REVISION LEFT KNEE REPLACEMENT BEARING;  Surgeon: Liam Lerner, MD;  Location: [facility] ORS;  Service: Orthopedics;  Laterality: Left;   TRANSURETHRAL RESECTION OF PROSTATE  2010    Allergies  Allergies  Allergen Reactions  Nsaids     REACTION: abnormal kidney fuction    Home Medications    Prior to Admission medications   Medication Sig Start Date End Date Taking? Authorizing Provider  acetaminophen  (TYLENOL  8 HOUR) 650 MG CR tablet Take 650 mg by mouth every 8 (eight) hours as needed for pain.    [provider]  albuterol  (VENTOLIN  HFA) 108 (90 Base) MCG/ACT inhaler Inhale 2 puffs into the lungs every 6 (six) hours as needed for wheezing or shortness of breath. 04/09/23    Cobb, Comer GAILS, NP  amLODipine  (NORVASC ) 5 MG tablet Take 5 mg by mouth daily.    [provider]  atorvastatin  (LIPITOR) 10 MG tablet Take 10 mg by mouth daily. 07/25/19   [provider]  DULERA  100-5 MCG/ACT AERO INHALE 2 PUFFS BY MOUTH TWICE DAILY (IN THE MORNING AND AT BEDTIME) 07/28/22   Mannam, Praveen, MD  guaiFENesin (MUCINEX) 600 MG 12 hr tablet Take 600 mg by mouth 2 (two) times daily as needed for cough or to loosen phlegm. 03/26/23   [provider]  HYDROcodone  bit-homatropine (HYCODAN) 5-1.5 MG/5ML syrup Take 5 mLs by mouth every 8 (eight) hours as needed for cough. 04/09/23   Cobb, Comer GAILS, NP  losartan  (COZAAR ) 100 MG tablet Take 100 mg by mouth daily. 04/19/15   [provider]  trimethoprim  (TRIMPEX ) 100 MG tablet Take 100 mg by mouth daily. 06/04/15   [provider]    Physical Exam    Vital Signs:  VANDY TSUCHIYA does not have vital signs available for review today.  Given telephonic nature of communication, physical exam is limited. AAOx3. NAD. Normal affect.  Speech and respirations are unlabored.   Assessment & Plan    Primary Cardiologist: Darryle ONEIDA Decent, MD  Preoperative cardiovascular risk assessment.  Inguinal hernia repair by Dr. Paola  Chart reviewed as part of pre-operative protocol coverage. According to the RCRI, patient has a 0.4% risk of MACE. Patient reports activity equivalent to >4.0 METS (swims and does other cardio 40-45 minutes at the gym).   Given past medical history and time since last visit, based on ACC/AHA guidelines, THEOPLIS GARCIAGARCIA would be at acceptable risk for the planned procedure without further cardiovascular testing.   Patient was advised that if he develops new symptoms prior to surgery to contact our office to arrange a follow-up appointment.  he verbalized understanding.    I will route this recommendation to the requesting party via Epic fax function.  Please call with  questions.  Time:   Today, I have spent 5 minutes with the patient with telehealth technology discussing medical history, symptoms, and management plan.     Barnie Hila, NP  06/12/2023, 7:41 AM

## 2023-06-16 ENCOUNTER — Telehealth: Payer: Self-pay | Admitting: Nurse Practitioner

## 2023-06-16 NOTE — Telephone Encounter (Signed)
 Patient states needs surgical clearance, Surgeon is Dr. [facility].Phone number is [phone].  Patient phone number is [phone].

## 2023-06-17 NOTE — Telephone Encounter (Signed)
 Fax received from Dr. Dr. Aniceto Barley with [facility]  to perform a inguinal hernia on patient.  Patient needs surgery clearance. Surgery is . Patient was seen on 04/23/23. Office protocol is a risk assessment can be sent to surgeon if patient has been seen in 60 days or less.   Pt has rov with Katie on 06/21/22 and will need to wait until this visit for clearance.  Routing back to clearance pool for now

## 2023-06-22 ENCOUNTER — Ambulatory Visit: Payer: Medicare Other | Admitting: Nurse Practitioner

## 2023-06-23 ENCOUNTER — Encounter: Payer: Self-pay | Admitting: Nurse Practitioner

## 2023-06-23 ENCOUNTER — Ambulatory Visit (INDEPENDENT_AMBULATORY_CARE_PROVIDER_SITE_OTHER): Payer: Medicare Other

## 2023-06-23 ENCOUNTER — Ambulatory Visit (INDEPENDENT_AMBULATORY_CARE_PROVIDER_SITE_OTHER): Payer: Medicare Other | Admitting: Nurse Practitioner

## 2023-06-23 VITALS — BP 124/80 | HR 90 | Temp 97.7°F | Ht 72.0 in | Wt 206.6 lb

## 2023-06-23 DIAGNOSIS — Z01811 Encounter for preprocedural respiratory examination: Secondary | ICD-10-CM | POA: Diagnosis not present

## 2023-06-23 DIAGNOSIS — J449 Chronic obstructive pulmonary disease, unspecified: Secondary | ICD-10-CM

## 2023-06-23 DIAGNOSIS — J984 Other disorders of lung: Secondary | ICD-10-CM | POA: Diagnosis not present

## 2023-06-23 DIAGNOSIS — J189 Pneumonia, unspecified organism: Secondary | ICD-10-CM

## 2023-06-23 NOTE — Progress Notes (Unsigned)
@Patient  ID: [name] [name], male    DOB: Aug 24, 1940, 83 y.o.   MRN: [id number]  Chief Complaint  Patient presents with   surgical clearance    Referring provider: Sigmund Hazel, MD  HPI: 83 year old male, former smoker followed for COPD. He is a patient of Dr. Shirlee More or last seen in office 04/23/2023 by Allison Quarry NP. Past medical history significant for HTN, right BBB, SSS s/p pacemaker.   TEST/EVENTS:  12/01/2019 PFT: FVC 108, FEV1 95, ratio 64, TLC 126, DLCO 90 04/09/2023 CXR: new patchy opacities in lingula, suspicious for pneumonia. Linear opacities in lung bases, likely atelectasis.   07/28/2022: OV with Dr. Shana Chute on [place]. Has previously tried Anoro. Complains of mild chest tightness and heartburn symptoms which is intermittent in nature. Has taken OTC antiacid without any change. Advised OTC Prilosec and Tums. Pulmonary rehab discussed but deferred.  04/09/2023: Ov with Naseer Hearn NP The patient, with a history of COPD, presented with a persistent cough that has been ongoing for approximately a month, starting in early October. He initially sought medical attention in late October and was prescribed doxycycline and prednisone by their PCP. The patient reported a temporary improvement in symptoms, but the cough returned. The cough is described as mostly dry, with a slight sensation of wetness in the throat, but no expectoration. The patient also reported a burning sensation in the lungs, particularly when coughing, but also intermittently at rest.   The patient denied any significant dyspnea or wheezing, although he noted a slight wheeze. He reported a possible fever at the onset of the symptoms, but denied any current fever or chills. There was no hemoptysis, leg swelling, or abnormal weight gain. The patient's appetite has remained normal.   The patient has been using Dulera for his COPD for several years and reported no daily difficulties with his breathing. This is the first time  this year he has required steroids and antibiotics. The patient denied any sinus symptoms that could be contributing to the cough.   The patient's COPD has been well-managed with [facility], and he has not required a rescue inhaler.  The patient was not experiencing any significant breathing difficulties at the time of the visit.   04/23/2023: OV with Muaaz Brau NP for follow up. He's feeling much better. Cough is resolved. Not noticing any wheezing. Breathing feels like it's back to his normal. He's not had to use his rescue inhaler. Completed his steroids and antibiotics. Eating and drinking well. No fevers, chills, hemoptysis. Using [facility] twice a day.  06/23/2023: Today - follow up Patient presents today for follow-up and surgical preop risk stratification.  He is debating whether or not he wants to have an inguinal hernia repair.  Surgery date is currently pending with Dr. Bedelia Person.  He will make the final decision over the next couple of weeks. Regarding his breathing, he has been doing well since he was here last.  He has not had any issues with his breathing or cough since he completed treatment for his pneumonia in November 2024.  No issues with wheezing.  Not having to use his rescue inhaler.  Still doing the [facility] twice daily.  Allergies  Allergen Reactions   Nsaids     REACTION: abnormal kidney fuction    Immunization History  Administered Date(s) Administered   Fluad Quad(high Dose 65+) 02/01/2020, 03/11/2021, 03/02/2023   Influenza Split 05/23/2015   Influenza, High Dose Seasonal PF 05/23/2015, 03/06/2017, 04/13/2017   Influenza-Unspecified 03/16/2013   Moderna  SARS-COV2 Booster Vaccination 03/02/2023   PFIZER(Purple Top)SARS-COV-2 Vaccination 06/14/2019, 07/04/2019, 02/01/2020   Pneumococcal Conjugate-13 08/19/2013   Pneumococcal Polysaccharide-23 04/21/2007, 06/06/2007, 06/02/2013   Td 03/27/2009   Tdap 03/06/2017   Zoster, Live 04/04/2008    Past Medical History:  Diagnosis Date    Arthritis    Chronic kidney disease    high creatinine level    Colon polyp March 2010   colonoscopy with Dr Randa Evens   COPD (chronic obstructive pulmonary disease) (HCC)    mild    Elevated fasting glucose    Hepatitis    History of BPH    with obstruction and hydronephrosis and slihtly elevated creatinine, using self catherization, followed by urologist Dr McDiarmid   History of ETT 2012   [name]   History of tobacco use    54 pack yr   HTN (hypertension)    Paroxysmal supraventricular tachycardia (HCC)    Presence of permanent cardiac pacemaker    Screening for AAA (abdominal aortic aneurysm) 3/16   3cm, nl/early aneurysm, repeat 3 yrs with u/s 3/19    Tobacco History: Social History   Tobacco Use  Smoking Status Former   Current packs/day: 0.00   Average packs/day: 3.0 packs/day for 30.0 years (90.0 ttl pk-yrs)   Types: Cigarettes   Start date: 06/03/1955   Quit date: 06/02/1985   Years since quitting: 38.0  Smokeless Tobacco Never   Counseling given: Not Answered   Outpatient Medications Prior to Visit  Medication Sig Dispense Refill   acetaminophen (TYLENOL 8 HOUR) 650 MG CR tablet Take 650 mg by mouth every 8 (eight) hours as needed for pain.     amLODipine (NORVASC) 5 MG tablet Take 5 mg by mouth daily.     atorvastatin (LIPITOR) 10 MG tablet Take 10 mg by mouth daily.     DULERA 100-5 MCG/ACT AERO INHALE 2 PUFFS BY MOUTH TWICE DAILY (IN THE MORNING AND AT BEDTIME) 13 g 6   losartan (COZAAR) 100 MG tablet Take 100 mg by mouth daily.     trimethoprim (TRIMPEX) 100 MG tablet Take 100 mg by mouth daily.     albuterol (VENTOLIN HFA) 108 (90 Base) MCG/ACT inhaler Inhale 2 puffs into the lungs every 6 (six) hours as needed for wheezing or shortness of breath. (Patient not taking: Reported on 06/23/2023) 8 g 2   guaiFENesin (MUCINEX) 600 MG 12 hr tablet Take 600 mg by mouth 2 (two) times daily as needed for cough or to loosen phlegm. (Patient not taking: Reported on  06/23/2023)     HYDROcodone bit-homatropine (HYCODAN) 5-1.5 MG/5ML syrup Take 5 mLs by mouth every 8 (eight) hours as needed for cough. (Patient not taking: Reported on 06/23/2023) 120 mL 0   No facility-administered medications prior to visit.     Review of Systems:   Constitutional: No weight loss or gain, night sweats, fevers, chills, fatigue, or lassitude. HEENT: No headaches, difficulty swallowing, tooth/dental problems, or sore throat. No sneezing, itching, ear ache, nasal congestion, or post nasal drip CV:  No chest pain, orthopnea, PND, swelling in lower extremities, anasarca, dizziness, palpitations, syncope Resp: No cough. No wheeze. No shortness of breath with exertion or at rest. No excess mucus or change in color of mucus. No hemoptysis. No chest wall deformity GI:  No heartburn, indigestion GU: No dysuria, change in color of urine, urgency or frequency.  No flank pain, no hematuria  Skin: No rash, lesions, ulcerations MSK:  No joint pain or swelling.   Neuro:  No dizziness or lightheadedness.  Psych: No depression or anxiety. Mood stable.     Physical Exam:  BP 124/80 (BP Location: Right Arm, Patient Position: Sitting, Cuff Size: Normal)   Pulse 90   Temp 97.7 F (36.5 C) (Oral)   Ht 6' (1.829 m)   Wt 206 lb 9.6 oz (93.7 kg)   SpO2 97%   BMI 28.02 kg/m   GEN: Pleasant, interactive, well-appearing; elderly; in no acute distress HEENT:  Normocephalic and atraumatic. PERRLA. Sclera white. Nasal turbinates pink, moist and patent bilaterally. No rhinorrhea present. Oropharynx pink and moist, without exudate or edema. No lesions, ulcerations, or postnasal drip.  NECK:  Supple w/ fair ROM. No JVD present. Normal carotid impulses w/o bruits. Thyroid symmetrical with no goiter or nodules palpated. No lymphadenopathy.   CV: RRR, no m/r/g, no peripheral edema. Pulses intact, +2 bilaterally. No cyanosis, pallor or clubbing. PULMONARY:  Unlabored, regular breathing. Clear  bilaterally A&P w/o wheezes/rales/rhonchi. No accessory muscle use.  GI: BS present and normoactive. Soft, non-tender to palpation. No organomegaly or masses detected.  MSK: No erythema, warmth or tenderness. Cap refil <2 sec all extrem. No deformities or joint swelling noted.  Neuro: A/Ox3. No focal deficits noted.   Skin: Warm, no lesions or rashe Psych: Normal affect and behavior. Judgement and thought content appropriate.     Lab Results:  CBC    Component Value Date/Time   WBC 8.2 10/11/2020 1420   RBC 4.77 10/11/2020 1420   HGB 14.2 10/11/2020 1420   HCT 42.5 10/11/2020 1420   PLT 172 10/11/2020 1420   MCV 89.1 10/11/2020 1420   MCH 29.8 10/11/2020 1420   MCHC 33.4 10/11/2020 1420   RDW 13.5 10/11/2020 1420   LYMPHSABS 4.0 10/11/2020 1420   MONOABS 0.7 10/11/2020 1420   EOSABS 0.2 10/11/2020 1420   BASOSABS 0.0 10/11/2020 1420    BMET    Component Value Date/Time   NA 140 09/08/2022 0848   K 4.7 09/08/2022 0848   CL 105 09/08/2022 0848   CO2 22 09/08/2022 0848   GLUCOSE 108 (H) 09/08/2022 0848   GLUCOSE 106 (H) 10/11/2020 1420   BUN 22 09/08/2022 0848   CREATININE 1.74 (H) 09/08/2022 0848   CALCIUM 8.6 09/08/2022 0848   GFRNONAA 33 (L) 10/11/2020 1420   GFRAA >60 02/03/2020 0547    BNP No results found for: "BNP"   Imaging:  DG Chest 2 View Result Date: 06/25/2023 CLINICAL DATA:  Follow-up pneumonia. EXAM: CHEST - 2 VIEW COMPARISON:  04/09/2023 and older exams. FINDINGS: Cardiac silhouette is normal in size and configuration. Stable left anterior chest wall dual lead pacemaker. No mediastinal or hilar masses. No evidence of adenopathy. Lungs are hyperexpanded. Linear lung base opacities are noted consistent with scarring, stable from prior exams. Hazy opacity noted at the left lung base on the most recent prior study is less apparent. Remainder of the lungs is clear. No pleural effusion or pneumothorax. Skeletal structures are intact. IMPRESSION: 1. No acute  cardiopulmonary disease. 2. Chronic linear lung base scarring. No current evidence of pneumonia. Electronically Signed   By: Amie Portland M.D.   On: 06/25/2023 15:29   CUP PACEART REMOTE DEVICE CHECK Result Date: 06/02/2023 Scheduled remote reviewed. Normal device function.  1 VT-NS episode on 05/20/23 at 16:02, 11 beats, V>A conduction, 176 bpm. Next remote 91 days. [facility], [facility]   Administration History     None          Latest Ref Rng & Units  12/01/2019    8:56 AM 08/11/2017   10:53 AM  PFT Results  FVC-Pre L 4.81  4.52   FVC-Predicted Pre % 108  100   FVC-Post L 4.74  4.72   FVC-Predicted Post % 107  105   Pre FEV1/FVC % % 63  57   Post FEV1/FCV % % 64  62   FEV1-Pre L 3.01  2.57   FEV1-Predicted Pre % 95  79   FEV1-Post L 3.03  2.93   DLCO uncorrected ml/min/mmHg 23.57  24.52   DLCO UNC% % 90  70   DLCO corrected ml/min/mmHg 42.97  24.81   DLCO COR %Predicted % 164  70   DLVA Predicted % 135  62   TLC L 9.46  10.61   TLC % Predicted % 126  142   RV % Predicted % 157  205     No results found for: "NITRICOXIDE"      Assessment & Plan:   CAP (community acquired pneumonia) Acute symptoms November 2024 with patchy opacities identified on CXR. Completed azithromycin and doxycycline with resolution of symptoms. Repeat CXR today to ensure resolution.   Patient Instructions  Continue Dulera 2 puffs Twice daily. Brush tongue and rinse mouth afterwards Continue Albuterol inhaler 2 puffs every 6 hours as needed for shortness of breath or wheezing. Notify if symptoms persist despite rescue inhaler/neb use.    Chest x ray today   Out of bed as soon as possible after surgery and back moving around. Use incentive spirometer 10 times an hour while awake during recovery. Take your rescue inhaler with you. Use your inhalers the day of surgery    Follow up in 4-6 months with Dr. Isaiah Serge or Katie Raechell Singleton,NP. If symptoms do not improve or worsen, please contact office for sooner follow  up or seek emergency care.    COPD, mild (HCC) Mild COPD with FEV1 95. Minimal symptom burden. Exacerbation November 2024 secondary to CAP. Resolved. Stable and compensated on current regimen. Continue Dulera bid and PRN albuterol. Action plan in place.   Pre-operative respiratory examination Low moderate risk. Factors that increase the risk for postoperative pulmonary complications are age, COPD.   Respiratory complications generally occur in 1% of ASA Class I patients, 5% of ASA Class II and 10% of ASA Class III-IV patients These complications rarely result in mortality and include postoperative pneumonia, atelectasis, pulmonary embolism, ARDS and increased time requiring postoperative mechanical ventilation.   Overall, I recommend proceeding with the surgery if the risk for respiratory complications are outweighed by the potential benefits. This will need to be discussed between the patient and surgeon.   To reduce risks of respiratory complications, I recommend: --Pre- and post-operative incentive spirometry performed frequently while awake --Inpatient use of currently prescribed bronchodilators  --Short duration of surgery as much as possible and avoid paralytic if possible --OOB, encourage mobility post-op   1) RISK FOR PROLONGED MECHANICAL VENTILAION - > 48h  1A) Arozullah - Prolonged mech ventilation risk Arozullah Postperative Pulmonary Risk Score - for mech ventilation dependence >48h USAA, Ann Surg 2000, major non-cardiac surgery) Comment Score  Type of surgery - abd ao aneurysm (27), thoracic (21), neurosurgery / upper abdominal / vascular (21), neck (11) Inguinal hernia repair 5  Emergency Surgery - (11)  0  ALbumin < 3 or poor nutritional state - (9)  0  BUN > 30 -  (8) 22 0  Partial or completely dependent functional status - (7)  0  COPD -  (  6) COPD 6  Age - 68 to 85 (4), > 70  (6) 83 6  TOTAL  17  Risk Stratifcation scores  - < 10 (0.5%), 11-19 (1.8%),  20-27 (4.2%), 28-40 (10.1%), >40 (26.6%)  1.8%       Advised if symptoms do not improve or worsen, to please contact office for sooner follow up or seek emergency care.   I spent 32 minutes of dedicated to the care of this patient on the date of this encounter to include pre-visit review of records, face-to-face time with the patient discussing conditions above, post visit ordering of testing, clinical documentation with the electronic health record, making appropriate referrals as documented, and communicating necessary findings to members of the patients care team.  Noemi Chapel, NP 06/25/2023  Pt aware and understands NP's role.

## 2023-06-23 NOTE — Patient Instructions (Signed)
Continue Dulera 2 puffs Twice daily. Brush tongue and rinse mouth afterwards Continue Albuterol inhaler 2 puffs every 6 hours as needed for shortness of breath or wheezing. Notify if symptoms persist despite rescue inhaler/neb use.    Chest x ray today   Out of bed as soon as possible after surgery and back moving around. Use incentive spirometer 10 times an hour while awake during recovery. Take your rescue inhaler with you. Use your inhalers the day of surgery    Follow up in 4-6 months with Dr. Isaiah Serge or Katie Hussain Maimone,NP. If symptoms do not improve or worsen, please contact office for sooner follow up or seek emergency care.

## 2023-06-25 ENCOUNTER — Encounter: Payer: Self-pay | Admitting: Nurse Practitioner

## 2023-06-25 NOTE — Assessment & Plan Note (Signed)
Mild COPD with FEV1 95. Minimal symptom burden. Exacerbation November 2024 secondary to CAP. Resolved. Stable and compensated on current regimen. Continue Dulera bid and PRN albuterol. Action plan in place.

## 2023-06-25 NOTE — Assessment & Plan Note (Signed)
Acute symptoms November 2024 with patchy opacities identified on CXR. Completed azithromycin and doxycycline with resolution of symptoms. Repeat CXR today to ensure resolution.   Patient Instructions  Continue Dulera 2 puffs Twice daily. Brush tongue and rinse mouth afterwards Continue Albuterol inhaler 2 puffs every 6 hours as needed for shortness of breath or wheezing. Notify if symptoms persist despite rescue inhaler/neb use.    Chest x ray today   Out of bed as soon as possible after surgery and back moving around. Use incentive spirometer 10 times an hour while awake during recovery. Take your rescue inhaler with you. Use your inhalers the day of surgery    Follow up in 4-6 months with Dr. Isaiah Serge or Katie Elisha Mcgruder,NP. If symptoms do not improve or worsen, please contact office for sooner follow up or seek emergency care.

## 2023-06-25 NOTE — Assessment & Plan Note (Signed)
Low moderate risk. Factors that increase the risk for postoperative pulmonary complications are age, COPD.   Respiratory complications generally occur in 1% of ASA Class I patients, 5% of ASA Class II and 10% of ASA Class III-IV patients These complications rarely result in mortality and include postoperative pneumonia, atelectasis, pulmonary embolism, ARDS and increased time requiring postoperative mechanical ventilation.   Overall, I recommend proceeding with the surgery if the risk for respiratory complications are outweighed by the potential benefits. This will need to be discussed between the patient and surgeon.   To reduce risks of respiratory complications, I recommend: --Pre- and post-operative incentive spirometry performed frequently while awake --Inpatient use of currently prescribed bronchodilators  --Short duration of surgery as much as possible and avoid paralytic if possible --OOB, encourage mobility post-op   1) RISK FOR PROLONGED MECHANICAL VENTILAION - > 48h  1A) Arozullah - Prolonged mech ventilation risk Arozullah Postperative Pulmonary Risk Score - for mech ventilation dependence >48h USAA, Ann Surg 2000, major non-cardiac surgery) Comment Score  Type of surgery - abd ao aneurysm (27), thoracic (21), neurosurgery / upper abdominal / vascular (21), neck (11) Inguinal hernia repair 5  Emergency Surgery - (11)  0  ALbumin < 3 or poor nutritional state - (9)  0  BUN > 30 -  (8) 22 0  Partial or completely dependent functional status - (7)  0  COPD -  (6) COPD 6  Age - 60 to 69 (4), > 70  (6) 83 6  TOTAL  17  Risk Stratifcation scores  - < 10 (0.5%), 11-19 (1.8%), 20-27 (4.2%), 28-40 (10.1%), >40 (26.6%)  1.8%

## 2023-07-01 DIAGNOSIS — R3914 Feeling of incomplete bladder emptying: Secondary | ICD-10-CM | POA: Diagnosis not present

## 2023-07-01 DIAGNOSIS — N1330 Unspecified hydronephrosis: Secondary | ICD-10-CM | POA: Diagnosis not present

## 2023-07-08 NOTE — Telephone Encounter (Signed)
 Copy of visit 06/23/23 with Katie faxed to [facility] Dr Aniceto Barley

## 2023-07-14 NOTE — Progress Notes (Signed)
Remote pacemaker transmission.

## 2023-08-21 ENCOUNTER — Other Ambulatory Visit: Payer: Self-pay | Admitting: Pulmonary Disease

## 2023-08-21 DIAGNOSIS — J449 Chronic obstructive pulmonary disease, unspecified: Secondary | ICD-10-CM

## 2023-09-01 ENCOUNTER — Ambulatory Visit (INDEPENDENT_AMBULATORY_CARE_PROVIDER_SITE_OTHER): Payer: Medicare Other

## 2023-09-01 DIAGNOSIS — I495 Sick sinus syndrome: Secondary | ICD-10-CM | POA: Diagnosis not present

## 2023-09-02 LAB — CUP PACEART REMOTE DEVICE CHECK
Battery Remaining Longevity: 95 mo
Battery Voltage: 3 V
Brady Statistic AP VP Percent: 99.03 %
Brady Statistic AP VS Percent: 0.03 %
Brady Statistic AS VP Percent: 0.87 %
Brady Statistic AS VS Percent: 0.07 %
Brady Statistic RA Percent Paced: 99.07 %
Brady Statistic RV Percent Paced: 99.9 %
Date Time Interrogation Session: 20250401101143
Implantable Lead Connection Status: 753985
Implantable Lead Connection Status: 753985
Implantable Lead Implant Date: 20211001
Implantable Lead Implant Date: 20211001
Implantable Lead Location: 753859
Implantable Lead Location: 753860
Implantable Lead Model: 5076
Implantable Lead Model: 5076
Implantable Pulse Generator Implant Date: 20211001
Lead Channel Impedance Value: 304 Ohm
Lead Channel Impedance Value: 380 Ohm
Lead Channel Impedance Value: 399 Ohm
Lead Channel Impedance Value: 418 Ohm
Lead Channel Pacing Threshold Amplitude: 0.5 V
Lead Channel Pacing Threshold Amplitude: 0.625 V
Lead Channel Pacing Threshold Pulse Width: 0.4 ms
Lead Channel Pacing Threshold Pulse Width: 0.4 ms
Lead Channel Sensing Intrinsic Amplitude: 0.75 mV
Lead Channel Sensing Intrinsic Amplitude: 0.75 mV
Lead Channel Sensing Intrinsic Amplitude: 2.375 mV
Lead Channel Sensing Intrinsic Amplitude: 2.375 mV
Lead Channel Setting Pacing Amplitude: 1.5 V
Lead Channel Setting Pacing Amplitude: 2 V
Lead Channel Setting Pacing Pulse Width: 0.4 ms
Lead Channel Setting Sensing Sensitivity: 0.9 mV
Zone Setting Status: 755011
Zone Setting Status: 755011

## 2023-09-04 ENCOUNTER — Encounter: Payer: Self-pay | Admitting: Cardiology

## 2023-10-15 NOTE — Addendum Note (Signed)
 Addended by: Lott Rouleau A on: 10/15/2023 10:43 AM   Modules accepted: Orders

## 2023-10-15 NOTE — Progress Notes (Signed)
 Remote pacemaker transmission.

## 2023-10-20 NOTE — Pre-Procedure Instructions (Signed)
 Surgical Instructions   Your procedure is scheduled on Oct 30, 2023. Report to [facility] Main Entrance "A" at 11:00 A.M., then check in with the Admitting office. Any questions or running late day of surgery: call [phone]  Questions prior to your surgery date: call [phone], Monday-Friday, 8am-4pm. If you experience any cold or flu symptoms such as cough, fever, chills, shortness of breath, etc. between now and your scheduled surgery, please notify us  at the above number.     Remember:  Do not eat after midnight the night before your surgery   You may drink clear liquids until 10:00 AM the morning of your surgery.   Clear liquids allowed are: Water, Non-Citrus Juices (without pulp), Carbonated Beverages, Clear Tea (no milk, honey, etc.), Black Coffee Only (NO MILK, CREAM OR POWDERED CREAMER of any kind), and Gatorade.    Take these medicines the morning of surgery with A SIP OF WATER: amLODipine (NORVASC)  atorvastatin  (LIPITOR)  DULERA  AERO inhaler trimethoprim  (TRIMPEX )    May take these medicines IF NEEDED: acetaminophen  (TYLENOL  8 HOUR)    One week prior to surgery, STOP taking any Aspirin  (unless otherwise instructed by your surgeon) Aleve, Naproxen, Ibuprofen, Motrin, Advil, Goody's, BC's, all herbal medications, fish oil, and non-prescription vitamins.                     Do NOT Smoke (Tobacco/Vaping) for 24 hours prior to your procedure.  If you use a CPAP at night, you may bring your mask/headgear for your overnight stay.   You will be asked to remove any contacts, glasses, piercing's, hearing aid's, dentures/partials prior to surgery. Please bring cases for these items if needed.    Patients discharged the day of surgery will not be allowed to drive home, and someone needs to stay with them for 24 hours.  SURGICAL WAITING ROOM VISITATION Patients may have no more than 2 support people in the waiting area - these visitors may rotate.   Pre-op nurse will  coordinate an appropriate time for 1 ADULT support person, who may not rotate, to accompany patient in pre-op.  Children under the age of 22 must have an adult with them who is not the patient and must remain in the main waiting area with an adult.  If the patient needs to stay at the hospital during part of their recovery, the visitor guidelines for inpatient rooms apply.  Please refer to the [facility] website for the visitor guidelines for any additional information.   If you received a COVID test during your pre-op visit  it is requested that you wear a mask when out in public, stay away from anyone that may not be feeling well and notify your surgeon if you develop symptoms. If you have been in contact with anyone that has tested positive in the last 10 days please notify you surgeon.      Pre-operative CHG Bathing Instructions   You can play a key role in reducing the risk of infection after surgery. Your skin needs to be as free of germs as possible. You can reduce the number of germs on your skin by washing with CHG (chlorhexidine  gluconate) soap before surgery. CHG is an antiseptic soap that kills germs and continues to kill germs even after washing.   DO NOT use if you have an allergy  to chlorhexidine /CHG or antibacterial soaps. If your skin becomes reddened or irritated, stop using the CHG and notify one of our RNs at [phone].  TAKE A SHOWER THE NIGHT BEFORE SURGERY AND THE DAY OF SURGERY    Please keep in mind the following:  DO NOT shave, including legs and underarms, 48 hours prior to surgery.   You may shave your face before/day of surgery.  Place clean sheets on your bed the night before surgery Use a clean washcloth (not used since being washed) for each shower. DO NOT sleep with pet's night before surgery.  CHG Shower Instructions:  Wash your face and private area with normal soap. If you choose to wash your hair, wash first with your normal shampoo.   After you use shampoo/soap, rinse your hair and body thoroughly to remove shampoo/soap residue.  Turn the water OFF and apply half the bottle of CHG soap to a CLEAN washcloth.  Apply CHG soap ONLY FROM YOUR NECK DOWN TO YOUR TOES (washing for 3-5 minutes)  DO NOT use CHG soap on face, private areas, open wounds, or sores.  Pay special attention to the area where your surgery is being performed.  If you are having back surgery, having someone wash your back for you may be helpful. Wait 2 minutes after CHG soap is applied, then you may rinse off the CHG soap.  Pat dry with a clean towel  Put on clean pajamas    Additional instructions for the day of surgery: DO NOT APPLY any lotions, deodorants, cologne, or perfumes.   Do not wear jewelry or makeup Do not wear nail polish, gel polish, artificial nails, or any other type of covering on natural nails (fingers and toes) Do not bring valuables to the hospital. [facility] is not responsible for valuables/personal belongings. Put on clean/comfortable clothes.  Please brush your teeth.  Ask your nurse before applying any prescription medications to the skin.

## 2023-10-21 ENCOUNTER — Encounter (HOSPITAL_COMMUNITY)
Admission: RE | Admit: 2023-10-21 | Discharge: 2023-10-21 | Disposition: A | Discharge status: Home / Self Care | Source: Ambulatory Visit | Attending: Surgery | Admitting: Surgery

## 2023-10-21 ENCOUNTER — Encounter (HOSPITAL_COMMUNITY): Payer: Self-pay

## 2023-10-21 ENCOUNTER — Other Ambulatory Visit: Payer: Self-pay

## 2023-10-21 VITALS — BP 132/78 | HR 86 | Temp 97.9°F | Resp 18 | Ht 72.0 in | Wt 210.0 lb

## 2023-10-21 DIAGNOSIS — I495 Sick sinus syndrome: Secondary | ICD-10-CM | POA: Diagnosis not present

## 2023-10-21 DIAGNOSIS — Z87891 Personal history of nicotine dependence: Secondary | ICD-10-CM | POA: Insufficient documentation

## 2023-10-21 DIAGNOSIS — F1090 Alcohol use, unspecified, uncomplicated: Secondary | ICD-10-CM | POA: Diagnosis not present

## 2023-10-21 DIAGNOSIS — I129 Hypertensive chronic kidney disease with stage 1 through stage 4 chronic kidney disease, or unspecified chronic kidney disease: Secondary | ICD-10-CM | POA: Diagnosis not present

## 2023-10-21 DIAGNOSIS — J449 Chronic obstructive pulmonary disease, unspecified: Secondary | ICD-10-CM | POA: Diagnosis not present

## 2023-10-21 DIAGNOSIS — N189 Chronic kidney disease, unspecified: Secondary | ICD-10-CM | POA: Diagnosis not present

## 2023-10-21 DIAGNOSIS — Z950 Presence of cardiac pacemaker: Secondary | ICD-10-CM | POA: Insufficient documentation

## 2023-10-21 DIAGNOSIS — I2510 Atherosclerotic heart disease of native coronary artery without angina pectoris: Secondary | ICD-10-CM | POA: Insufficient documentation

## 2023-10-21 DIAGNOSIS — Z01818 Encounter for other preprocedural examination: Secondary | ICD-10-CM | POA: Insufficient documentation

## 2023-10-21 HISTORY — DX: Pneumonia, unspecified organism: J18.9

## 2023-10-21 LAB — COMPREHENSIVE METABOLIC PANEL WITH GFR
ALT: 17 U/L (ref 0–44)
AST: 22 U/L (ref 15–41)
Albumin: 3.3 g/dL — ABNORMAL LOW (ref 3.5–5.0)
Alkaline Phosphatase: 57 U/L (ref 38–126)
Anion gap: 8 (ref 5–15)
BUN: 19 mg/dL (ref 8–23)
CO2: 23 mmol/L (ref 22–32)
Calcium: 8 mg/dL — ABNORMAL LOW (ref 8.9–10.3)
Chloride: 104 mmol/L (ref 98–111)
Creatinine, Ser: 1.42 mg/dL — ABNORMAL HIGH (ref 0.61–1.24)
GFR, Estimated: 49 mL/min — ABNORMAL LOW (ref >60)
Glucose, Bld: 112 mg/dL — ABNORMAL HIGH (ref 70–99)
Potassium: 3.9 mmol/L (ref 3.5–5.1)
Sodium: 135 mmol/L (ref 135–145)
Total Bilirubin: 0.9 mg/dL (ref 0.0–1.2)
Total Protein: 6.3 g/dL — ABNORMAL LOW (ref 6.5–8.1)

## 2023-10-21 LAB — CBC
HCT: 40.3 % (ref 39.0–52.0)
Hemoglobin: 13.3 g/dL (ref 13.0–17.0)
MCH: 28.9 pg (ref 26.0–34.0)
MCHC: 33 g/dL (ref 30.0–36.0)
MCV: 87.4 fL (ref 80.0–100.0)
Platelets: 164 10*3/uL (ref 150–400)
RBC: 4.61 MIL/uL (ref 4.22–5.81)
RDW: 13.6 % (ref 11.5–15.5)
WBC: 11.3 10*3/uL — ABNORMAL HIGH (ref 4.0–10.5)
nRBC: 0 % (ref 0.0–0.2)

## 2023-10-21 NOTE — Telephone Encounter (Signed)
  Pt is calling to follow up clearance. His procedure is scheduled on 10/30/23

## 2023-10-21 NOTE — Telephone Encounter (Signed)
 LVM updating pt on preop clearance. Pt was asked to give us  a call if he has any further questions.

## 2023-10-21 NOTE — Progress Notes (Signed)
 PCP - Dr. Perley Bradley Cardiologist - Dr. Jackquelyn Mass - Last office visit 11/19/2022 Electrophysiologist - Dr. Harvie Liner - Last office visit 09/08/2022. Pt made aware the he will need to be seen in the office prior to surgery. Instructed him to reach out to office to make appointment and RN messaged device clinic to help facilitate a office appointment. Pulmonologist- Dr. Praveen Mannam - Last office visit 04/23/2023  PPM/ICD - [employer] PPM Device Orders - Device orders not received due to overdue office appointment. Rep Notified - [employer] rep emailed 10/20/23  Chest x-ray - 06/23/2023 EKG - 10/21/2023 Stress Test - 07/09/2010 ECHO - 09/16/2021 Cardiac Cath - 08/03/2020  Sleep Study - Denies CPAP - n/a  No DM  Last dose of GLP1 agonist- n/a GLP1 instructions: n/a  Blood Thinner Instructions: n/a Aspirin  Instructions: n/a  ERAS Protcol - Clear liquids until 1000 morning of surgery PRE-SURGERY Ensure or G2- n/a  COVID TEST- n/a   Anesthesia review: Yes. [employer] PPM, HTN, COPD, Hepatitis (untreated) and elevated creatinine. He has received pulmonary and cardiac clearance, but is pending in office appointment with EP.  Patient denies shortness of breath, fever, cough and chest pain at PAT appointment. Pt denies any respiratory illness/infection in the last two months.    All instructions explained to the patient, with a verbal understanding of the material. Patient agrees to go over the instructions while at home for a better understanding. Patient also instructed to self quarantine after being tested for COVID-19. The opportunity to ask questions was provided.

## 2023-10-22 ENCOUNTER — Telehealth: Payer: Self-pay | Admitting: *Deleted

## 2023-10-22 DIAGNOSIS — K4090 Unilateral inguinal hernia, without obstruction or gangrene, not specified as recurrent: Secondary | ICD-10-CM | POA: Diagnosis not present

## 2023-10-22 NOTE — Telephone Encounter (Signed)
   Name: [name] [name]  DOB: 07/14/1940  MRN: [id number]  Primary Cardiologist: Oneil Bigness, MD  Chart reviewed as part of pre-operative protocol coverage. Because of Verdon Ferrante Cacioppo's past medical history and time since last visit, he will require a follow-up in-office visit in order to better assess preoperative cardiovascular risk.  Pre-op covering staff: - Please schedule appointment and call patient to inform them. If patient already had an upcoming appointment within acceptable timeframe, please add "pre-op clearance" to the appointment notes so provider is aware. - Please contact requesting surgeon's office via preferred method (i.e, phone, fax) to inform them of need for appointment prior to surgery.    Francene Ing, Retha Cast, NP  10/22/2023, 12:41 PM

## 2023-10-22 NOTE — Telephone Encounter (Signed)
 Left message for the pt to call back ASAP as he needs to be an in office appt for preop clearance.

## 2023-10-22 NOTE — Telephone Encounter (Signed)
   Pre-operative Risk Assessment    Patient Name: NIRAV SWEDA  DOB: 07/03/1940 MRN: [id number]   Date of last office visit: 11/2022 Date of next office visit: N/A   Request for Surgical Clearance    Procedure:  RIGHT INGUINAL HERNIA  Date of Surgery:  Clearance 10/30/23                                Surgeon:  Hassell Linsey, MD Surgeon's Group or Practice Name:  [facility] Phone number:  [phone] Fax number:  [phone]   Type of Clearance Requested:   - Medical    Type of Anesthesia:  General    Additional requests/questions:    Berenda Breaker   10/22/2023, 12:37 PM

## 2023-10-22 NOTE — Progress Notes (Signed)
 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  Follows with pulmonology for history of former smoker with associated mild COPD. 12/01/2019 PFT: FVC 108, FEV1 95, ratio 64, TLC 126, DLCO 90.  He is maintained on Dulera  and as needed albuterol .  Seen by Mauri Sous, NP on 06/23/2023 for preop evaluation.  Per note, stable and compensated on current regimen, low to moderate risk for surgery.  Other pertinent history includes CKD.   EKG 10/22/2023: AV dual paced rhythm.  Rate 65.  Event monitor 09/2022: HR 59 - 132 bpm, average 69 bpm. Rare supraventricular ectopy. Occasional ventricular ectopy, 1.8%. No atrial fibrillation. No sustained arrhythmias.  TTE 09/16/2021: 1. Left ventricular ejection fraction, by estimation, is 50 to 55%. The  left ventricle has low normal function. The left ventricle has no regional  wall motion abnormalities. Left ventricular diastolic parameters were  normal.   2. Right ventricular systolic function is normal. The right ventricular  size is normal. There is normal pulmonary artery systolic pressure. The  estimated right ventricular systolic pressure is 31.3 mmHg.   3. The mitral valve is normal in structure. Mild mitral valve  regurgitation. No evidence of mitral stenosis.   4. The aortic valve was not well visualized. Aortic valve regurgitation  is trivial. No aortic stenosis is present.   5. The inferior vena cava is normal in size with greater than 50%  respiratory variability, suggesting right atrial pressure of 3 mmHg.   Cath 08/03/20: Mid Cx lesion is 20% stenosed. Mid LAD lesion is 20% stenosed. Dist LAD lesion is 20% stenosed.   1. Mild non-obstructive CAD 2. Normal LV filling pressure 3. Non-cardiac chest pain   Recommendations: Medical management of mild non-obstructive CAD

## 2023-10-22 NOTE — Telephone Encounter (Signed)
 Patient returned our call who has been scheduled for in office visit for preop clearance tomorrow at our [place] location patient voiced understanding

## 2023-10-23 ENCOUNTER — Ambulatory Visit (HOSPITAL_BASED_OUTPATIENT_CLINIC_OR_DEPARTMENT_OTHER): Admitting: Family

## 2023-10-23 VITALS — BP 138/80 | HR 178 | Ht 72.0 in | Wt 209.0 lb

## 2023-10-23 DIAGNOSIS — E785 Hyperlipidemia, unspecified: Secondary | ICD-10-CM

## 2023-10-23 DIAGNOSIS — I25118 Atherosclerotic heart disease of native coronary artery with other forms of angina pectoris: Secondary | ICD-10-CM | POA: Diagnosis not present

## 2023-10-23 DIAGNOSIS — Z01810 Encounter for preprocedural cardiovascular examination: Secondary | ICD-10-CM

## 2023-10-23 DIAGNOSIS — I10 Essential (primary) hypertension: Secondary | ICD-10-CM | POA: Diagnosis not present

## 2023-10-23 MED ORDER — AMLODIPINE BESYLATE 10 MG PO TABS: 10.0000 mg | ORAL_TABLET | Freq: Every day | ORAL | 3 refills | Status: AC

## 2023-10-23 NOTE — Progress Notes (Unsigned)
 Cardiology Office Note:  .   Date:  10/23/2023  ID:  PHELAN SCHADT, DOB [birth year], MRN [id number] PCP: Perley Bradley, MD  [facility] Providers Cardiologist:  Oneil Bigness, MD Electrophysiologist:  Boyce Byes, MD    History of Present Illness: .   [name] [name] is a 83 y.o. male with history of SSS s/p PPM, HLD, CAD entheses LHC 08/03/2020 nonobstructive disease with 20% LAD, 20% LCx), COPD, HTN, tobacco use, PVC.  Heart rate at home no more than 130 bpm. Reports no shortness of breath nor dyspnea on exertion. Reports no chest pain, pressure, or tightness. No edema, orthopnea, PND. Reports no palpitations.   BP 130s/80s taking both his Losartan  and Amlodipine in the morning but notes he may ***.   Drink lots of coffee. Also drinks water.   ROS: Please see the history of present illness.    All other systems reviewed and are negative.   Studies Reviewed: Aaron Aas   EKG Interpretation Date/Time:  Friday Oct 23 2023 13:54:34 EDT Ventricular Rate:  141 PR Interval:    QRS Duration:  178 QT Interval:  172 QTC Calculation: 263 R Axis:   79  Text Interpretation: Atrial sensed ventricular paced rhythm Sinus tachycardia Confirmed by Neomi Banks ([id number]) on 10/23/2023 2:16:59 PM    Cardiac Studies & Procedures   ______________________________________________________________________________________________ CARDIAC CATHETERIZATION  CARDIAC CATHETERIZATION 08/03/2020  Conclusion  Mid Cx lesion is 20% stenosed.  Mid LAD lesion is 20% stenosed.  Dist LAD lesion is 20% stenosed.  1. Mild non-obstructive CAD 2. Normal LV filling pressure 3. Non-cardiac chest pain  Recommendations: Medical management of mild non-obstructive CAD  Findings Coronary Findings Diagnostic  Dominance: Right  Left Anterior Descending Vessel is large. Mid LAD lesion is 20% stenosed. Dist LAD lesion is 20% stenosed.  Left Circumflex Vessel is large. Mid Cx lesion is 20%  stenosed.  Right Coronary Artery Vessel is large.  Intervention  No interventions have been documented.     ECHOCARDIOGRAM  ECHOCARDIOGRAM COMPLETE 09/16/2021  Narrative ECHOCARDIOGRAM REPORT    Patient Name:   [name] [name] Date of Exam: 09/16/2021 Medical Rec #:  [id number]        Height:       73.0 in Accession #:    [id number]       Weight:       209.0 lb Date of Birth:  03-Nov-1940        BSA:          2.192 m Patient Age:    80 years         BP:           147/89 mmHg Patient Gender: M                HR:           88 bpm. Exam Location:  [address]  Procedure: 2D Echo, 3D Echo, Cardiac Doppler and Color Doppler  Indications:    R55 Syncope  History:        Patient has prior history of Echocardiogram examinations, most recent 08/03/2020. Pacemaker, Arrythmias:RBBB and Bradycardia, Signs/Symptoms:Chest Pain and Dyspnea; Risk Factors:Hypertension.  Sonographer:    Juventino Oppenheim RCS Referring Phys: [id number] Leanora Prophet LAMBERT  IMPRESSIONS   1. Left ventricular ejection fraction, by estimation, is 50 to 55%. The left ventricle has low normal function. The left ventricle has no regional wall motion abnormalities. Left ventricular diastolic parameters were normal. 2. Right ventricular systolic function is  normal. The right ventricular size is normal. There is normal pulmonary artery systolic pressure. The estimated right ventricular systolic pressure is 31.3 mmHg. 3. The mitral valve is normal in structure. Mild mitral valve regurgitation. No evidence of mitral stenosis. 4. The aortic valve was not well visualized. Aortic valve regurgitation is trivial. No aortic stenosis is present. 5. The inferior vena cava is normal in size with greater than 50% respiratory variability, suggesting right atrial pressure of 3 mmHg.  FINDINGS Left Ventricle: Left ventricular ejection fraction, by estimation, is 50 to 55%. The left ventricle has low normal function. The left ventricle has  no regional wall motion abnormalities. The left ventricular internal cavity size was normal in size. There is no left ventricular hypertrophy. Left ventricular diastolic parameters were normal.  Right Ventricle: The right ventricular size is normal. No increase in right ventricular wall thickness. Right ventricular systolic function is normal. There is normal pulmonary artery systolic pressure. The tricuspid regurgitant velocity is 2.66 m/s, and with an assumed right atrial pressure of 3 mmHg, the estimated right ventricular systolic pressure is 31.3 mmHg.  Left Atrium: Left atrial size was normal in size.  Right Atrium: Right atrial size was normal in size.  Pericardium: There is no evidence of pericardial effusion.  Mitral Valve: The mitral valve is normal in structure. Mild mitral valve regurgitation. No evidence of mitral valve stenosis.  Tricuspid Valve: The tricuspid valve is normal in structure. Tricuspid valve regurgitation is trivial.  Aortic Valve: The aortic valve was not well visualized. Aortic valve regurgitation is trivial. No aortic stenosis is present.  Pulmonic Valve: The pulmonic valve was not well visualized. Pulmonic valve regurgitation is not visualized.  Aorta: The aortic root and ascending aorta are structurally normal, with no evidence of dilitation.  Venous: The inferior vena cava is normal in size with greater than 50% respiratory variability, suggesting right atrial pressure of 3 mmHg.  IAS/Shunts: The interatrial septum was not well visualized.   LEFT VENTRICLE PLAX 2D LVIDd:         5.10 cm   Diastology LVIDs:         3.80 cm   LV e' medial:    8.59 cm/s LV PW:         1.00 cm   LV E/e' medial:  7.1 LV IVS:        1.00 cm   LV e' lateral:   9.90 cm/s LVOT diam:     2.00 cm   LV E/e' lateral: 6.1 LV SV:         66 LV SV Index:   30 LVOT Area:     3.14 cm  3D Volume EF: 3D EF:        53 % LV EDV:       104 ml LV ESV:       49 ml LV SV:        55  ml  RIGHT VENTRICLE RV Basal diam:  3.80 cm RV S prime:     15.10 cm/s TAPSE (M-mode): 2.7 cm RVSP:           31.3 mmHg  LEFT ATRIUM             Index        RIGHT ATRIUM           Index LA diam:        3.80 cm 1.73 cm/m   RA Pressure: 3.00 mmHg LA Vol (A2C):   59.0 ml 26.91  ml/m  RA Area:     15.70 cm LA Vol (A4C):   36.1 ml 16.47 ml/m  RA Volume:   42.20 ml  19.25 ml/m LA Biplane Vol: 48.1 ml 21.94 ml/m AORTIC VALVE LVOT Vmax:   107.50 cm/s LVOT Vmean:  78.200 cm/s LVOT VTI:    0.210 m  AORTA Ao Root diam: 3.40 cm Ao Asc diam:  3.30 cm  MITRAL VALVE                TRICUSPID VALVE MV Area (PHT):              TR Peak grad:   28.3 mmHg MV Decel Time:              TR Vmax:        266.00 cm/s MV E velocity: 60.80 cm/s   Estimated RAP:  3.00 mmHg MV A velocity: 106.00 cm/s  RVSP:           31.3 mmHg MV E/A ratio:  0.57 SHUNTS Systemic VTI:  0.21 m Systemic Diam: 2.00 cm  Carson Clara MD Electronically signed by Carson Clara MD Signature Date/Time: 09/16/2021/12:32:31 PM    Final    MONITORS  LONG TERM MONITOR (3-14 DAYS) 09/22/2022  Narrative HR 59 - 132 bpm, average 69 bpm. Rare supraventricular ectopy. Occasional ventricular ectopy, 1.8%. No atrial fibrillation. No sustained arrhythmias.  Donelda Fujita T. Marven Slimmer, MD, [facility], [facility] Cardiac Electrophysiology       ______________________________________________________________________________________________      Risk Assessment/Calculations:             Physical Exam:   VS:  BP 138/80   Pulse (!) 178   Ht 6' (1.829 m)   Wt 209 lb (94.8 kg)   SpO2 93%   BMI 28.35 kg/m    Wt Readings from Last 3 Encounters:  10/23/23 209 lb (94.8 kg)  10/21/23 210 lb (95.3 kg)  06/23/23 206 lb 9.6 oz (93.7 kg)    GEN: Well nourished, well developed in no acute distress NECK: No JVD; No carotid bruits CARDIAC: RRR, no murmurs, rubs, gallops RESPIRATORY:  Clear to auscultation without rales, wheezing  or rhonchi  ABDOMEN: Soft, non-tender, non-distended EXTREMITIES:  No edema; No deformity   ASSESSMENT AND PLAN: .    Preop - Exercise tolerance >4 METS. Per AHA/ACC guidelines, he is deemed acceptable risk for the planned procedure without additional cardiovascular testing. Will route to surgical team so they are aware.  Would await EP appt 10/27/23 prior to final clearance.  CAD / HLD, LDL goal <70 - Stable with no anginal symptoms. No indication for ischemic evaluation.  GDMT atorvastatin  10mg  daily. 09/2022 LDL 31. Unable to repeat lipid panel as not fasting. Recommend aiming for 150 minutes of moderate intensity activity per week and following a heart healthy diet.    HTN - BP not at goal <130/80. Increase Amlodipine to 10mg  daily. Continue losartan  100mg  daily. Discussed importance of daily dosing of medications, notes missed doses.  S/p PPM - EKG today atrial sensed ventricular paced rhythm 178 bpm ? 141 bpm. Asymptomatic with no palpitations. Reports home heart rate never >130 bpm. 10/21/23 labs no anemia, normal creatinine and potassium. He has upcoming visit next week for device check of PPM, anticipate heart rate will be re-evaluated. Will route to Mertha Abrahams, [place] so she is aware.         Dispo: follow up with EP as scheduled and general cardiology in 1 year  Signed, Clearnce Curia,  NP

## 2023-10-23 NOTE — Patient Instructions (Addendum)
 Medication Instructions:  CHANGE your Amlodipine 10mg  every day *If you need a refill on your cardiac medications before your next appointment, please call your pharmacy*  Testing/Procedures: Your EKG today showed sinus tachycardia which is a regular but fast heart rhythm. Your device check coming up on Tuesday will let us  look closer into your heart rates.  Follow-Up: At [facility], you and your health needs are our priority.  As part of our continuing mission to provide you with exceptional heart care, our providers are all part of one team.  This team includes your primary Cardiologist (physician) and Advanced Practice Providers or APPs (Physician Assistants and Nurse Practitioners) who all work together to provide you with the care you need, when you need it.  Your next appointment:   1 year(s)  Provider:   Jackquelyn Mass, MD or Neomi Banks, NP    We recommend signing up for the patient portal called "MyChart".  Sign up information is provided on this After Visit Summary.  MyChart is used to connect with patients for Virtual Visits (Telemedicine).  Patients are able to view lab/test results, encounter notes, upcoming appointments, etc.  Non-urgent messages can be sent to your provider as well.   To learn more about what you can do with MyChart, go to [email or web address].   Other Instructions  Heart Healthy Diet Recommendations: A low-salt diet is recommended. Meats should be grilled, baked, or boiled. Avoid fried foods. Focus on lean protein sources like fish or chicken with vegetables and fruits. The American Heart Association is a Chief Technology Officer!  American Heart Association Diet and Lifeystyle Recommendations   Exercise recommendations: The American Heart Association recommends 150 minutes of moderate intensity exercise weekly. Try 30 minutes of moderate intensity exercise 4-5 times per week. This could include walking, jogging, or swimming.

## 2023-10-26 ENCOUNTER — Encounter (HOSPITAL_BASED_OUTPATIENT_CLINIC_OR_DEPARTMENT_OTHER): Payer: Self-pay | Admitting: Family

## 2023-10-26 NOTE — Progress Notes (Unsigned)
  Cardiology Office Note:  .   Date:  10/26/2023  ID:  [name] [name], DOB [birth year], MRN [id number] PCP: Perley Bradley, MD  [facility] Providers Cardiologist:  Oneil Bigness, MD Electrophysiologist:  Boyce Byes, MD {  History of Present Illness: .   [name] [name] is a 83 y.o. male w/PMHx of  HLD, CAD w/LHC 08/03/2020 nonobstructive disease with 20% LAD, 20% LCx), COPD, HTN, tobacco use, PVC.  SSSx w/PPM   (Cards team has addressed his pre-op risk assessment), saw APP 10/23/23, felt to be an acceptable risk for his planned surgery, amlodipine  increased for better BP control Some discussion of HRs never >130 > and further HR discussion to be addressed by EP  EKG reported as atrial sensed ventricular paced rhythm 178 bpm ? 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 Rare supraventricular ectopy. Occasional ventricular ectopy, 1.8%. No atrial fibrillation. No sustained arrhythmias.   09/16/21: TTE 1. Left ventricular ejection fraction, by estimation, is 50 to 55%. The left ventricle has low normal function. The left ventricle has no regional wall motion abnormalities. Left ventricular diastolic parameters were normal. 2. Right ventricular systolic function is normal. The right ventricular size is normal. There is normal pulmonary artery systolic pressure. The estimated right ventricular systolic pressure is 31.3 mmHg. 3. The  mitral valve is normal in structure. Mild mitral valve regurgitation. No evidence of mitral stenosis. 4. The aortic valve was not well visualized. Aortic valve regurgitation is trivial. No aortic stenosis is present. 5. The inferior vena cava is normal in size with greater than 50% respiratory variability, suggesting right atrial pressure of 3 mmHg.   CARDIAC CATHETERIZATION 08/03/2020 Conclusion  Mid Cx lesion is 20% stenosed.  Mid LAD lesion is 20% stenosed.  Dist LAD lesion is 20% stenosed.   1. Mild non-obstructive CAD 2. Normal LV filling pressure 3. Non-cardiac chest pain   Recommendations: Medical management of mild non-obstructive CAD   Risk Assessment/Calculations:    Physical Exam:   VS:  There were no vitals taken for this visit.   Wt Readings from Last 3 Encounters:  10/23/23 209 lb (94.8 kg)  10/21/23 210 lb (95.3 kg)  06/23/23 206 lb 9.6 oz (93.7 kg)    GEN: Well nourished, well developed in no acute distress NECK: No JVD; No carotid bruits CARDIAC: ***RRR, no murmurs, rubs, gallops RESPIRATORY:  *** CTA b/l without rales, wheezing or rhonchi  ABDOMEN: Soft, non-tender, non-distended EXTREMITIES:  No edema; No deformity   PPM site: *** is stable, no thinning, fluctuation, tethering  ASSESSMENT AND PLAN: .    PPM *** intact function *** no programming changes made *** no particular PPM management needed for surgeries below the umbilicus *** though defer to surgical center/department protocols     Dispo: ***  Signed, Debbie Fails, PA-C

## 2023-10-27 ENCOUNTER — Ambulatory Visit: Attending: Physician Assistant | Admitting: Physician Assistant

## 2023-10-27 ENCOUNTER — Encounter: Payer: Self-pay | Admitting: Cardiology

## 2023-10-27 VITALS — BP 127/67 | HR 69 | Ht 72.0 in | Wt 211.0 lb

## 2023-10-27 DIAGNOSIS — Z950 Presence of cardiac pacemaker: Secondary | ICD-10-CM | POA: Insufficient documentation

## 2023-10-27 LAB — CUP PACEART INCLINIC DEVICE CHECK
Battery Remaining Longevity: 96 mo
Battery Voltage: 2.99 V
Brady Statistic AP VP Percent: 96.23 %
Brady Statistic AP VS Percent: 0.14 %
Brady Statistic AS VP Percent: 2.85 %
Brady Statistic AS VS Percent: 0.78 %
Brady Statistic RA Percent Paced: 96.63 %
Brady Statistic RV Percent Paced: 99.08 %
Date Time Interrogation Session: 20250527132841
Implantable Lead Connection Status: 753985
Implantable Lead Connection Status: 753985
Implantable Lead Implant Date: 20211001
Implantable Lead Implant Date: 20211001
Implantable Lead Location: 753859
Implantable Lead Location: 753860
Implantable Lead Model: 5076
Implantable Lead Model: 5076
Implantable Pulse Generator Implant Date: 20211001
Lead Channel Impedance Value: 342 Ohm
Lead Channel Impedance Value: 399 Ohm
Lead Channel Impedance Value: 437 Ohm
Lead Channel Impedance Value: 456 Ohm
Lead Channel Pacing Threshold Amplitude: 0.5 V
Lead Channel Pacing Threshold Amplitude: 0.5 V
Lead Channel Pacing Threshold Pulse Width: 0.4 ms
Lead Channel Pacing Threshold Pulse Width: 0.4 ms
Lead Channel Sensing Intrinsic Amplitude: 0.75 mV
Lead Channel Sensing Intrinsic Amplitude: 0.75 mV
Lead Channel Sensing Intrinsic Amplitude: 2.875 mV
Lead Channel Sensing Intrinsic Amplitude: 3 mV
Lead Channel Setting Pacing Amplitude: 1.5 V
Lead Channel Setting Pacing Amplitude: 2 V
Lead Channel Setting Pacing Pulse Width: 0.4 ms
Lead Channel Setting Sensing Sensitivity: 0.9 mV
Zone Setting Status: 755011
Zone Setting Status: 755011

## 2023-10-27 NOTE — Patient Instructions (Signed)
 Medication Instructions:   Your physician recommends that you continue on your current medications as directed. Please refer to the Current Medication list given to you today.   *If you need a refill on your cardiac medications before your next appointment, please call your pharmacy*   Lab Work: NONE ORDERED  TODAY     If you have labs (blood work) drawn today and your tests are completely normal, you will receive your results only by: MyChart Message (if you have MyChart) OR A paper copy in the mail If you have any lab test that is abnormal or we need to change your treatment, we will call you to review the results.  Testing/Procedures: NONE ORDERED  TODAY    Follow-Up: At [facility], you and your health needs are our priority.  As part of our continuing mission to provide you with exceptional heart care, our providers are all part of one team.  This team includes your primary Cardiologist (physician) and Advanced Practice Providers or APPs (Physician Assistants and Nurse Practitioners) who all work together to provide you with the care you need, when you need it.  Your next appointment:    1 year(s)   Provider:   You may see Boyce Byes, MD or one of the following Advanced Practice Providers on your designated Care Team:   Mertha Abrahams, [place]     We recommend signing up for the patient portal called "MyChart".  Sign up information is provided on this After Visit Summary.  MyChart is used to connect with patients for Virtual Visits (Telemedicine).  Patients are able to view lab/test results, encounter notes, upcoming appointments, etc.  Non-urgent messages can be sent to your provider as well.   To learn more about what you can do with MyChart, go to [email or web address].   Other Instructions

## 2023-10-27 NOTE — Progress Notes (Signed)
 PERIOPERATIVE PRESCRIPTION FOR IMPLANTED CARDIAC DEVICE PROGRAMMING  Patient Information: Name:  [name] [name]  DOB:  03-19-1941  MRN:  [id number]  Planned Procedure:  Right Inguinal Hernia Repair  Surgeon:  Dr. Hassell Linsey  Date of Procedure:  Oct 30, 2023  Cautery will be used.  Position during surgery:  Supine   Please send documentation back to:  Arlin Benes (Fax # [phone])  Device Information:  Clinic EP Physician:  Leanora Prophet. Marven Slimmer, MD  Device Type:  Pacemaker Manufacturer and Phone #:  Medtronic: [id number] Pacemaker Dependent?:  No. Date of Last Device Check:  10/27/23  Normal Device Function?:  Yes.    Electrophysiologist's Recommendations:  Have magnet available. Provide continuous ECG monitoring when magnet is used or reprogramming is to be performed.  Procedure should not interfere with device function.  No device programming or magnet placement needed.  Per Device Clinic Standing Orders, Mariella Shore, RN  10:27 AM 10/27/2023

## 2023-10-27 NOTE — Anesthesia Preprocedure Evaluation (Signed)
 Anesthesia Evaluation  Patient identified by MRN, date of birth, ID band Patient awake    Reviewed: Allergy  & Precautions, NPO status , Patient's Chart, lab work & pertinent test results  Airway Mallampati: II  TM Distance: >3 FB Neck ROM: Full    Dental  (+) Dental Advisory Given, Missing   Pulmonary COPD,  COPD inhaler, former smoker   Pulmonary exam normal breath sounds clear to auscultation       Cardiovascular hypertension, Pt. on medications (-) angina (-) Past MI Normal cardiovascular exam+ dysrhythmias Supra Ventricular Tachycardia + pacemaker  Rhythm:Regular Rate:Normal     Neuro/Psych negative neurological ROS  negative psych ROS   GI/Hepatic Neg liver ROS,,,RIGHT INGUINAL HERNIA   Endo/Other  negative endocrine ROS    Renal/GU Renal InsufficiencyRenal disease     Musculoskeletal  (+) Arthritis ,    Abdominal   Peds  Hematology negative hematology ROS (+)   Anesthesia Other Findings Day of surgery medications reviewed with the patient.  Reproductive/Obstetrics                             Anesthesia Physical Anesthesia Plan  ASA: 3  Anesthesia Plan: General   Post-op Pain Management: Tylenol  PO (pre-op)*   Induction: Intravenous  PONV Risk Score and Plan: 2 and Dexamethasone  and Ondansetron   Airway Management Planned: Oral ETT  Additional Equipment:   Intra-op Plan:   Post-operative Plan: Extubation in OR  Informed Consent: I have reviewed the patients History and Physical, chart, labs and discussed the procedure including the risks, benefits and alternatives for the proposed anesthesia with the patient or authorized representative who has indicated his/her understanding and acceptance.     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 Seen by general cardiology Neomi Banks, NP on 10/23/23 for preop eval. Per note, "Exercise tolerance >4 METS. Per AHA/ACC guidelines, he is deemed acceptable risk for the planned procedure without additional cardiovascular testing. Will route to surgical team so they are aware.  Would await EP appt 10/27/23 prior to final clearance to reassess heart rate. He is not on anticoagulant nor antiplatelet that would need held prior to procedure." Seen by EP cardiology [facility] 10/27/23. Per note, "PPM: intact function. no programming changes made. no particular PPM management needed for surgeries below the umbilicus, though defer to surgical center/department protocols    Follows with pulmonology for history of former smoker with associated mild COPD. 12/01/2019 PFT: FVC 108, FEV1 95, ratio 64, TLC 126, DLCO 90.  He is maintained on Dulera  and as needed albuterol .  Seen by Mauri Sous, NP on 06/23/2023 for preop evaluation.  Per note, stable and compensated on current regimen, low to moderate risk for surgery.  Other pertinent history includes CKD.  Preop labs reviewed, creatinine mildly elevated 1.42, otherwise unremarkable.  EKG 10/22/2023: AV dual paced rhythm.  Rate 65.  Perioperative prescription for implanted cardiac device programming per progress note 10/27/23: Device Information:  Clinic EP Physician:  Donelda Fujita T. Marven Slimmer, MD  Device Type:  Pacemaker Manufacturer and Phone #:  Medtronic: [phone] Pacemaker Dependent?:  No. Date of Last Device Check:  10/27/23             Normal Device Function?:  Yes.    Electrophysiologist's Recommendations:   Have magnet  available.  Provide continuous ECG monitoring when magnet is used or reprogramming is to be performed.   Procedure should not interfere with device function.  No device programming or magnet placement needed.   Event monitor 09/2022: HR 59 - 132 bpm, average 69  bpm. Rare supraventricular ectopy. Occasional ventricular ectopy, 1.8%. No atrial fibrillation. No sustained arrhythmias.  TTE 09/16/2021: 1. Left ventricular ejection fraction, by estimation, is 50 to 55%. The  left ventricle has low normal function. The left ventricle has no regional  wall motion abnormalities. Left ventricular diastolic parameters were  normal.  2. Right ventricular systolic function is normal. The right ventricular  size is normal. There is normal pulmonary artery systolic pressure. The  estimated right ventricular systolic pressure is 31.3 mmHg.  3. The mitral valve is normal in structure. Mild mitral valve  regurgitation. No evidence of mitral stenosis.  4. The aortic valve was not well visualized. Aortic valve regurgitation  is trivial. No aortic stenosis is present.  5. The inferior vena cava is normal in size with greater than 50%  respiratory variability, suggesting right atrial pressure of 3 mmHg.   Cath 08/03/20:  Mid Cx lesion is 20% stenosed.  Mid LAD lesion is 20% stenosed.  Dist LAD lesion is 20% stenosed.   1. Mild non-obstructive CAD 2. Normal LV filling pressure 3. Non-cardiac chest pain  Recommendations: Medical management of mild non-obstructive CAD   )        Anesthesia Quick Evaluation

## 2023-10-28 ENCOUNTER — Ambulatory Visit: Payer: Self-pay | Admitting: Cardiology

## 2023-10-30 ENCOUNTER — Other Ambulatory Visit: Payer: Self-pay

## 2023-10-30 ENCOUNTER — Ambulatory Visit (HOSPITAL_COMMUNITY)
Admission: RE | Admit: 2023-10-30 | Discharge: 2023-10-30 | Disposition: A | Discharge status: Home / Self Care | Attending: Surgery | Admitting: Surgery

## 2023-10-30 ENCOUNTER — Encounter (HOSPITAL_COMMUNITY)
Admission: RE | Disposition: A | Discharge status: Home / Self Care | Payer: Self-pay | Source: Home / Self Care | Attending: Surgery

## 2023-10-30 ENCOUNTER — Ambulatory Visit (HOSPITAL_BASED_OUTPATIENT_CLINIC_OR_DEPARTMENT_OTHER): Payer: Self-pay | Admitting: Anesthesiology

## 2023-10-30 ENCOUNTER — Ambulatory Visit (HOSPITAL_COMMUNITY): Payer: Self-pay | Admitting: Vascular Surgery

## 2023-10-30 ENCOUNTER — Encounter (HOSPITAL_COMMUNITY): Payer: Self-pay | Admitting: Surgery

## 2023-10-30 DIAGNOSIS — I10 Essential (primary) hypertension: Secondary | ICD-10-CM

## 2023-10-30 DIAGNOSIS — J449 Chronic obstructive pulmonary disease, unspecified: Secondary | ICD-10-CM | POA: Diagnosis not present

## 2023-10-30 DIAGNOSIS — Z87891 Personal history of nicotine dependence: Secondary | ICD-10-CM | POA: Insufficient documentation

## 2023-10-30 DIAGNOSIS — K4090 Unilateral inguinal hernia, without obstruction or gangrene, not specified as recurrent: Secondary | ICD-10-CM | POA: Diagnosis not present

## 2023-10-30 DIAGNOSIS — I129 Hypertensive chronic kidney disease with stage 1 through stage 4 chronic kidney disease, or unspecified chronic kidney disease: Secondary | ICD-10-CM | POA: Insufficient documentation

## 2023-10-30 DIAGNOSIS — D1779 Benign lipomatous neoplasm of other sites: Secondary | ICD-10-CM | POA: Diagnosis not present

## 2023-10-30 DIAGNOSIS — N189 Chronic kidney disease, unspecified: Secondary | ICD-10-CM | POA: Diagnosis not present

## 2023-10-30 DIAGNOSIS — K4030 Unilateral inguinal hernia, with obstruction, without gangrene, not specified as recurrent: Secondary | ICD-10-CM | POA: Diagnosis not present

## 2023-10-30 DIAGNOSIS — M1990 Unspecified osteoarthritis, unspecified site: Secondary | ICD-10-CM | POA: Diagnosis not present

## 2023-10-30 DIAGNOSIS — K420 Umbilical hernia with obstruction, without gangrene: Secondary | ICD-10-CM | POA: Insufficient documentation

## 2023-10-30 HISTORY — PX: INGUINAL HERNIA REPAIR: SHX194

## 2023-10-30 SURGERY — REPAIR, HERNIA, INGUINAL, ADULT
Anesthesia: General | Site: Abdomen | Laterality: Right

## 2023-10-30 MED ORDER — LIDOCAINE 2% (20 MG/ML) 5 ML SYRINGE
INTRAMUSCULAR | Status: DC | PRN
  Administered 2023-10-30: 60 mg via INTRAVENOUS

## 2023-10-30 MED ORDER — ONDANSETRON HCL 4 MG/2ML IJ SOLN: 4.0000 mg | Freq: Once | INTRAMUSCULAR | Status: DC | PRN

## 2023-10-30 MED ORDER — LACTATED RINGERS IV SOLN: INTRAVENOUS | Status: DC

## 2023-10-30 MED ORDER — METHOCARBAMOL 750 MG PO TABS: 750.0000 mg | ORAL_TABLET | Freq: Four times a day (QID) | ORAL | 1 refills | Status: DC

## 2023-10-30 MED ORDER — ORAL CARE MOUTH RINSE: 15.0000 mL | Freq: Once | OROMUCOSAL | Status: AC

## 2023-10-30 MED ORDER — OXYCODONE HCL 5 MG PO TABS: 5.0000 mg | ORAL_TABLET | ORAL | 0 refills | Status: DC | PRN

## 2023-10-30 MED ORDER — LIDOCAINE 2% (20 MG/ML) 5 ML SYRINGE
INTRAMUSCULAR | Status: AC
  Filled 2023-10-30: qty 5

## 2023-10-30 MED ORDER — BUPIVACAINE LIPOSOME 1.3 % IJ SUSP
INTRAMUSCULAR | Status: AC
Start: 2023-10-30 — End: ?
  Filled 2023-10-30: qty 20

## 2023-10-30 MED ORDER — ONDANSETRON HCL 4 MG/2ML IJ SOLN
INTRAMUSCULAR | Status: DC | PRN
  Administered 2023-10-30: 4 mg via INTRAVENOUS

## 2023-10-30 MED ORDER — PROPOFOL 10 MG/ML IV BOLUS
INTRAVENOUS | Status: DC | PRN
  Administered 2023-10-30: 130 mg via INTRAVENOUS

## 2023-10-30 MED ORDER — FENTANYL CITRATE (PF) 100 MCG/2ML IJ SOLN
INTRAMUSCULAR | Status: DC | PRN
  Administered 2023-10-30 (×2): 50 ug via INTRAVENOUS

## 2023-10-30 MED ORDER — DEXAMETHASONE SODIUM PHOSPHATE 10 MG/ML IJ SOLN
INTRAMUSCULAR | Status: AC
Start: 2023-10-30 — End: ?
  Filled 2023-10-30: qty 1

## 2023-10-30 MED ORDER — SUGAMMADEX SODIUM 200 MG/2ML IV SOLN
INTRAVENOUS | Status: DC | PRN
  Administered 2023-10-30: 200 mg via INTRAVENOUS

## 2023-10-30 MED ORDER — 0.9 % SODIUM CHLORIDE (POUR BTL) OPTIME
TOPICAL | Status: DC | PRN
  Administered 2023-10-30: 1000 mL

## 2023-10-30 MED ORDER — ACETAMINOPHEN 500 MG PO TABS
1000.0000 mg | ORAL_TABLET | Freq: Once | ORAL | Status: AC
  Administered 2023-10-30: 1000 mg via ORAL
  Filled 2023-10-30: qty 2

## 2023-10-30 MED ORDER — DEXAMETHASONE SODIUM PHOSPHATE 4 MG/ML IJ SOLN
INTRAMUSCULAR | Status: DC | PRN
  Administered 2023-10-30: 8 mg via INTRAVENOUS

## 2023-10-30 MED ORDER — PHENYLEPHRINE HCL (PRESSORS) 10 MG/ML IV SOLN
INTRAVENOUS | Status: DC | PRN
  Administered 2023-10-30 (×3): 80 ug via INTRAVENOUS

## 2023-10-30 MED ORDER — ROCURONIUM BROMIDE 100 MG/10ML IV SOLN
INTRAVENOUS | Status: DC | PRN
  Administered 2023-10-30: 60 mg via INTRAVENOUS
  Administered 2023-10-30: 20 mg via INTRAVENOUS

## 2023-10-30 MED ORDER — BUPIVACAINE LIPOSOME 1.3 % IJ SUSP
INTRAMUSCULAR | Status: DC | PRN
  Administered 2023-10-30: 20 mL

## 2023-10-30 MED ORDER — FENTANYL CITRATE (PF) 250 MCG/5ML IJ SOLN
INTRAMUSCULAR | Status: AC
  Filled 2023-10-30: qty 5

## 2023-10-30 MED ORDER — CEFAZOLIN SODIUM-DEXTROSE 2-4 GM/100ML-% IV SOLN
INTRAVENOUS | Status: AC
  Filled 2023-10-30: qty 100

## 2023-10-30 MED ORDER — PROPOFOL 10 MG/ML IV BOLUS
INTRAVENOUS | Status: AC
  Filled 2023-10-30: qty 20

## 2023-10-30 MED ORDER — BUPIVACAINE HCL (PF) 0.25 % IJ SOLN
INTRAMUSCULAR | Status: AC
  Filled 2023-10-30: qty 30

## 2023-10-30 MED ORDER — ONDANSETRON HCL 4 MG/2ML IJ SOLN
INTRAMUSCULAR | Status: AC
Start: 2023-10-30 — End: ?
  Filled 2023-10-30: qty 2

## 2023-10-30 MED ORDER — DOCUSATE SODIUM 100 MG PO CAPS: 100.0000 mg | ORAL_CAPSULE | Freq: Two times a day (BID) | ORAL | 2 refills | Status: DC

## 2023-10-30 MED ORDER — CEFAZOLIN SODIUM-DEXTROSE 2-3 GM-%(50ML) IV SOLR
INTRAVENOUS | Status: DC | PRN
  Administered 2023-10-30: 2 g via INTRAVENOUS

## 2023-10-30 MED ORDER — CHLORHEXIDINE GLUCONATE 0.12 % MT SOLN
OROMUCOSAL | Status: AC
  Administered 2023-10-30: 15 mL via OROMUCOSAL
  Filled 2023-10-30: qty 15

## 2023-10-30 MED ORDER — BUPIVACAINE HCL (PF) 0.25 % IJ SOLN
INTRAMUSCULAR | Status: DC | PRN
  Administered 2023-10-30: 20 mL

## 2023-10-30 MED ORDER — MIDAZOLAM HCL 2 MG/2ML IJ SOLN
INTRAMUSCULAR | Status: AC
Start: 2023-10-30 — End: ?
  Filled 2023-10-30: qty 2

## 2023-10-30 MED ORDER — ROCURONIUM BROMIDE 10 MG/ML (PF) SYRINGE
PREFILLED_SYRINGE | INTRAVENOUS | Status: AC
  Filled 2023-10-30: qty 10

## 2023-10-30 MED ORDER — FENTANYL CITRATE (PF) 100 MCG/2ML IJ SOLN: 25.0000 ug | INTRAMUSCULAR | Status: DC | PRN

## 2023-10-30 MED ORDER — CHLORHEXIDINE GLUCONATE 0.12 % MT SOLN: 15.0000 mL | Freq: Once | OROMUCOSAL | Status: AC

## 2023-10-30 MED ORDER — ACETAMINOPHEN 500 MG PO TABS: 1000.0000 mg | ORAL_TABLET | Freq: Four times a day (QID) | ORAL | 3 refills | Status: AC

## 2023-10-30 SURGICAL SUPPLY — 35 items
BLADE CLIPPER SURG (BLADE) IMPLANT
CHLORAPREP W/TINT 26 (MISCELLANEOUS) ×2 IMPLANT
COVER SURGICAL LIGHT HANDLE (MISCELLANEOUS) ×2 IMPLANT
DERMABOND ADVANCED .7 DNX12 (GAUZE/BANDAGES/DRESSINGS) ×2 IMPLANT
DRAIN PENROSE .5X12 LATEX STL (DRAIN) ×2 IMPLANT
DRAPE LAPAROSCOPIC ABDOMINAL (DRAPES) IMPLANT
DRSG TEGADERM 4X4.75 (GAUZE/BANDAGES/DRESSINGS) IMPLANT
ELECT CAUTERY BLADE 6.4 (BLADE) IMPLANT
ELECTRODE REM PT RTRN 9FT ADLT (ELECTROSURGICAL) ×2 IMPLANT
GAUZE 4X4 16PLY ~~LOC~~+RFID DBL (SPONGE) ×2 IMPLANT
GAUZE SPONGE 4X4 12PLY STRL (GAUZE/BANDAGES/DRESSINGS) IMPLANT
GLOVE BIO SURGEON STRL SZ 6.5 (GLOVE) ×2 IMPLANT
GLOVE BIOGEL PI IND STRL 6 (GLOVE) ×2 IMPLANT
GOWN STRL REUS W/ TWL LRG LVL3 (GOWN DISPOSABLE) ×6 IMPLANT
KIT BASIN OR (CUSTOM PROCEDURE TRAY) ×2 IMPLANT
KIT TURNOVER KIT B (KITS) ×2 IMPLANT
MESH ULTRAPRO 3X6 7.6X15CM (Mesh General) IMPLANT
NDL HYPO 25GX1X1/2 BEV (NEEDLE) ×2 IMPLANT
NEEDLE HYPO 25GX1X1/2 BEV (NEEDLE) ×1 IMPLANT
NS IRRIG 1000ML POUR BTL (IV SOLUTION) ×2 IMPLANT
PACK GENERAL/GYN (CUSTOM PROCEDURE TRAY) ×2 IMPLANT
PAD ARMBOARD POSITIONER FOAM (MISCELLANEOUS) ×2 IMPLANT
SPONGE INTESTINAL PEANUT (DISPOSABLE) IMPLANT
SUT MNCRL AB 4-0 PS2 18 (SUTURE) ×2 IMPLANT
SUT NOVA NAB GS-21 0 18 T12 DT (SUTURE) IMPLANT
SUT PDS AB 0 CT 36 (SUTURE) IMPLANT
SUT SILK 0 SH 30 (SUTURE) IMPLANT
SUT SILK 2 0 SH (SUTURE) IMPLANT
SUT SILK 3-0 18XBRD TIE 12 (SUTURE) IMPLANT
SUT VIC AB 0 CT2 27 (SUTURE) ×2 IMPLANT
SUT VIC AB 2-0 SH 27X BRD (SUTURE) ×2 IMPLANT
SUT VIC AB 3-0 SH 27X BRD (SUTURE) IMPLANT
SUT VIC AB 3-0 SH 27XBRD (SUTURE) ×2 IMPLANT
SYR CONTROL 10ML LL (SYRINGE) ×2 IMPLANT
TOWEL GREEN STERILE (TOWEL DISPOSABLE) ×2 IMPLANT

## 2023-10-30 NOTE — Anesthesia Postprocedure Evaluation (Signed)
 Anesthesia Post Note  Patient: [name] [name]  Procedure(s) Performed: REPAIR, HERNIA, INGUINAL, ADULT & UMBILICAL HERNIA (Right: Abdomen)     Patient location during evaluation: PACU Anesthesia Type: General Level of consciousness: awake and alert Pain management: pain level controlled Vital Signs Assessment: post-procedure vital signs reviewed and stable Respiratory status: spontaneous breathing, nonlabored ventilation and respiratory function stable Cardiovascular status: blood pressure returned to baseline and stable Postop Assessment: no apparent nausea or vomiting Anesthetic complications: no   No notable events documented.  Last Vitals:  Vitals:   10/30/23 1300 10/30/23 1315  BP: 121/75 127/65  Pulse: 60 62  Resp: 10 13  Temp:  36.5 C  SpO2: 92% 92%    Last Pain:  Vitals:   10/30/23 1315  TempSrc:   PainSc: 3                  Erin Havers

## 2023-10-30 NOTE — Progress Notes (Signed)
 Patient self catheterizes. Please contact his wife for the cath kit if needed in pacu.

## 2023-10-30 NOTE — Anesthesia Procedure Notes (Signed)
 Procedure Name: Intubation Date/Time: 10/30/2023 10:57 AM  Performed by: Sol Duos, CRNAPre-anesthesia Checklist: Patient identified, Emergency Drugs available, Suction available and Patient being monitored Patient Re-evaluated:Patient Re-evaluated prior to induction Oxygen  Delivery Method: Circle system utilized Preoxygenation: Pre-oxygenation with 100% oxygen  Induction Type: IV induction Ventilation: Mask ventilation without difficulty Laryngoscope Size: Glidescope and 4 Grade View: Grade I Tube type: Oral Tube size: 7.5 mm Number of attempts: 1 Airway Equipment and Method: Oral airway and Rigid stylet Placement Confirmation: ETT inserted through vocal cords under direct vision, positive ETCO2 and breath sounds checked- equal and bilateral Secured at: 26 cm Tube secured with: Tape Dental Injury: Teeth and Oropharynx as per pre-operative assessment

## 2023-10-30 NOTE — Transfer of Care (Signed)
 Immediate Anesthesia Transfer of Care Note  Patient: AMAREON PHUNG  Procedure(s) Performed: REPAIR, HERNIA, INGUINAL, ADULT & UMBILICAL HERNIA (Right: Abdomen)  Patient Location: PACU  Anesthesia Type:General  Level of Consciousness: awake and alert   Airway & Oxygen  Therapy: Patient Spontanous Breathing and Patient connected to nasal cannula oxygen   Post-op Assessment: Report given to RN and Post -op Vital signs reviewed and stable  Post vital signs: Reviewed and stable  Last Vitals:  Vitals Value Taken Time  BP 151/64 10/30/23 1225  Temp 36.5 C 10/30/23 1225  Pulse 64 10/30/23 1229  Resp 14 10/30/23 1229  SpO2 95 % 10/30/23 1229  Vitals shown include unfiled device data.  Last Pain:  Vitals:   10/30/23 0851  TempSrc:   PainSc: 0-No pain         Complications: No notable events documented.

## 2023-10-30 NOTE — H&P (Signed)
 [name] [name] is an 83 y.o. male.   HPI: 76M with RIH and umbilical hernia. Plan for repair of both today, primary umbilical and inguinal with mesh. The patient has had no hospitalizations, doctors visits, ER visits, surgeries, or newly diagnosed allergies since being seen in the office.    Past Medical History:  Diagnosis Date   Arthritis    Chronic kidney disease    high creatinine level    Colon polyp 07/2008   colonoscopy with Dr Denece Finger   COPD (chronic obstructive pulmonary disease) (HCC)    mild    Elevated fasting glucose    Hepatitis    History of BPH    with obstruction and hydronephrosis and slihtly elevated creatinine, using self catherization, followed by urologist Dr McDiarmid   History of ETT 2012   [name]   History of tobacco use    54 pack yr   HTN (hypertension)    Paroxysmal supraventricular tachycardia (HCC)    Pneumonia    Presence of permanent cardiac pacemaker    Screening for AAA (abdominal aortic aneurysm) 08/2014   3cm, nl/early aneurysm, repeat 3 yrs with u/s 3/19    Past Surgical History:  Procedure Laterality Date   APPENDECTOMY     CATARACT EXTRACTION W/ INTRAOCULAR LENS IMPLANT Bilateral    COLONOSCOPY     LEFT HEART CATH AND CORONARY ANGIOGRAPHY N/A 08/03/2020   Procedure: LEFT HEART CATH AND CORONARY ANGIOGRAPHY;  Surgeon: Odie Benne, MD;  Location: [facility];  Service: Cardiovascular;  Laterality: N/A;   PACEMAKER IMPLANT N/A 03/02/2020   Procedure: PACEMAKER IMPLANT;  Surgeon: Boyce Byes, MD;  Location: [facility] INVASIVE CV LAB;  Service: Cardiovascular;  Laterality: N/A;   REPLACEMENT TOTAL KNEE BILATERAL  [id number]   TOTAL KNEE REVISION Left 10/15/2020   Procedure: REVISION LEFT KNEE REPLACEMENT BEARING;  Surgeon: Wendolyn Hamburger, MD;  Location: [facility] OR[facility];  Service: Orthopedics;  Laterality: Left;   TRANSURETHRAL RESECTION OF PROSTATE  06/02/2008    Family History  Problem Relation Age of Onset   Kidney  disease Father        ESRD   Healthy Brother    Healthy Brother    Colon cancer Neg Hx    Colon polyps Neg Hx     Social History:  reports that he quit smoking about 38 years ago. His smoking use included cigarettes. He started smoking about 68 years ago. He has a 90 pack-year smoking history. He has never used smokeless tobacco. He reports that he does not currently use alcohol after a past usage of about 7.0 standard drinks of alcohol per week. He reports that he does not use drugs.  Allergies:  Allergies  Allergen Reactions   Nsaids     REACTION: abnormal kidney fuction    Medications: I have reviewed the patient's current medications.  No results found for this or any previous visit (from the past 48 hours).  No results found.  ROS 10 point review of systems is negative except as listed above in HPI.   Physical Exam Blood pressure (!) 161/85, pulse 83, temperature 97.6 F (36.4 C), temperature source Oral, resp. rate 18, height 6' (1.829 m), weight 93 kg, SpO2 95%. Constitutional: well-developed, well-nourished HEENT: pupils equal, round, reactive to light, 2mm b/l, moist conjunctiva, external inspection of ears and nose normal, hearing intact Oropharynx: normal oropharyngeal mucosa, normal dentition Neck: no thyromegaly, trachea midline, no midline cervical tenderness to palpation Chest: breath sounds equal bilaterally, normal  respiratory effort, no midline or lateral chest wall tenderness to palpation/deformity Abdomen: soft, umbilical hernia, no bruising, no hepatosplenomegaly GU: RIH  Skin: warm, dry, no rashes Psych: normal memory, normal mood/affect     Assessment/Plan: RIH and umbilical hernia - plan open repair of each, inguinal hernia with mesh.  FEN - strict NPO DVT - SCDs,   Dispo - home post-op    Anda Bamberg, MD General and Trauma Surgery [facility]

## 2023-10-30 NOTE — Op Note (Signed)
   Operative Note   Date: 10/30/2023  Procedure: open right inguinal hernia repair with mesh, resection of scrotal lipoma, open primary umbilical hernia repair  Pre-op diagnosis: right inguinal hernia, incarcerated umbilical hernia Post-op diagnosis: incarcerated, direct, rioght inguinal hernia, incarcerated umbilical hernia 0.5cm  Indication and clinical history: The patient is a 83 y.o. year old male with a right inguinal hernia and umbilical hernia.  Surgeon: Anda Bamberg, MD  Anesthesiologist: Denese Finn, MD Anesthesia: General  Findings:  Specimen: none EBL: <5cc Drains/Implants: 3x6 ultrapro mesh  Disposition: PACU - hemodynamically stable.  Description of procedure: The patient was positioned supine on the operating room table. General anesthetic induction and intubation were uneventful. Foley catheter insertion was performed and was atraumatic. Time-out was performed verifying correct patient, procedure, laterality, and signature of informed consent. The right groin was prepped and draped in the usual sterile fashion.  An incision was made approximately 2/3 distance from the ASIS to the pubic tubercle and deepened through Scarpa's fascia until the external oblique aponeurosis was reached. The external oblique aponeurosis was entered sharply and opened using Metzenbaum scissors, so as to avoid injuring the ilioinguinal nerve. The spermatic cord was isolated and encircled with a Penrose drain. A scrotal lipoma was resected. An indirect hernia was clearly identified and reduced.  A 3x6 ultrapro mesh was cut to fit and sutured to the pubic tubercle with a zero vicryl suture and run along the shelving edge inferiorly and the conjoined tendon superiorly. A slit was made in the center of the mesh to accommodate the cord structures and a vicryl suture was used to create a snug fit around it. The ends of the mesh were overlapped beneath the external oblique aponeurosis. The external oblique  was closed with a 2-0 vicryl suture and Scarpa's closed with a 3-0 vicryl. Local anesthetic was infiltrated into the surrounding tissues.   A curved incision was made at the umbilicus and the fascia identified. The incarcerated hernia contents were fat and were too much to reduce, so the extruding fat was amputated. The fascia was closed with a single 0 novofil figure of eight suture. A 3-0 vicryl was used to recreate the appearance of an umbilicus. The skin was closed with 4-0 monocryl suture at both sites and dermabond applied as dressing.   All sponge and instrument counts were correct at the conclusion of the procedure. The patient was awakened from anesthesia, extubated uneventfully, and transported to PACU - hemodynamically stable.. There were no complications.    Anda Bamberg, MD General and Trauma Surgery [facility] Surgery

## 2023-10-30 NOTE — Discharge Instructions (Signed)
 [facility]  LAPAROSCOPIC SURGERY: POST OP INSTRUCTIONS Always review your discharge instruction sheet given to you by the facility where your surgery was performed. IF YOU HAVE DISABILITY OR FAMILY LEAVE FORMS, YOU MUST BRING THEM TO THE OFFICE FOR PROCESSING.   DO NOT GIVE THEM TO YOUR DOCTOR.  PAIN CONTROL  Pain regimen: take over-the-counter tylenol  (acetaminophen ) 1000mg  every six hours and the robaxin  (methocarbamol ) 750mg  every six hours. With both of these, you should be taking something every three hours. Example: tylenol  ( acetaminophen ) at 9am, robaxin  (methocarbamol ) at 12pm, tylenol  (acetaminophen ) again at 3pm, robaxin  (methocarbamol ) at 6pm. You also have a prescription for oxycodone , which should be taken if the tylenol  (acetaminophen ) and robaxin  (methocarbamol ) are not enough to control your pain. You may take the oxycodone  as frequently as every four hours as needed, but if you are taking the other medications as above, you should not need the oxycodone  this frequently. You have also been given a prescription for colace (docusate) which is a stool softener. Please take this as prescribed because the oxycodone  can cause constipation and the colace (docusate) will minimize or prevent constipation. Do not drive while taking or under the influence of the oxycodone  as it is a narcotic medication. Use ice packs to help control pain. If you need a refill on your pain medication, please contact your pharmacy.  They will contact our office to request authorization. Prescriptions will not be filled after 5pm or on week-ends.  HOME MEDICATIONS Take your usually prescribed medications unless otherwise directed.  DIET You should follow a light diet the first few days after arrival home.  Be sure to include lots of fluids daily.   CONSTIPATION It is common to experience some constipation after surgery and if you are taking pain medication.  Increasing fluid intake and  taking a stool softener (such as Colace) will usually help or prevent this problem from occurring.  A mild laxative (Milk of Magnesia or Miralax) should be taken according to package instructions if there are no bowel movements after 48 hours.  WOUND/INCISION CARE Most patients will experience some swelling and bruising in the area of the incisions.  Ice packs will help.  Swelling and bruising can take several days to resolve.  May shower beginning 10/31/2023.  Do not peel off or scrub skin glue. May allow warm soapy water to run over incision, then rinse and pat dry.  Do not soak in any water (tubs, hot tubs, pools, lakes, oceans) for one week.   ACTIVITIES You may resume regular (light) daily activities beginning the next day--such as daily self-care, walking, climbing stairs--gradually increasing activities as tolerated.  You may have sexual intercourse when it is comfortable.   No lifting greater than 5 pounds for six weeks.  You may drive when you are no longer taking narcotic pain medication, you can comfortably wear a seatbelt, and you can safely maneuver your car and apply brakes.  FOLLOW-UP You should see your doctor in the office for a follow-up appointment approximately 2-3 weeks after your surgery.  You should have been given your post-op/follow-up appointment when your surgery was scheduled.  If you did not receive a post-op/follow-up appointment, make sure that you call for this appointment within a day or two after you arrive home to insure a convenient appointment time.  WHEN TO CALL YOUR DOCTOR: Fever over 101.5 Inability to urinate Continued bleeding from incision. Increased pain, redness, or drainage from the incision. Increasing abdominal pain  The clinic staff is available to answer your questions during regular business hours.  Please don't hesitate to call and ask to speak to one of the nurses for clinical concerns.  If you have a medical emergency, go to the nearest  emergency room or call 911.  A surgeon from [facility] is always on call at the hospital. [address], [place], [place]  [address] ? [address] [address], [place], [place]   [address] [phone] ? [phone] ? FAX [phone] Web site: [email or web address]

## 2023-11-02 ENCOUNTER — Encounter (HOSPITAL_COMMUNITY): Payer: Self-pay | Admitting: Surgery

## 2023-11-20 ENCOUNTER — Other Ambulatory Visit: Payer: Self-pay | Admitting: Pulmonary Disease

## 2023-11-20 DIAGNOSIS — J449 Chronic obstructive pulmonary disease, unspecified: Secondary | ICD-10-CM

## 2023-12-01 ENCOUNTER — Ambulatory Visit (INDEPENDENT_AMBULATORY_CARE_PROVIDER_SITE_OTHER): Payer: Medicare Other

## 2023-12-01 DIAGNOSIS — I495 Sick sinus syndrome: Secondary | ICD-10-CM | POA: Diagnosis not present

## 2023-12-01 LAB — CUP PACEART REMOTE DEVICE CHECK
Battery Remaining Longevity: 95 mo
Battery Voltage: 2.99 V
Brady Statistic AP VP Percent: 97.32 %
Brady Statistic AP VS Percent: 0.04 %
Brady Statistic AS VP Percent: 2.62 %
Brady Statistic AS VS Percent: 0.01 %
Brady Statistic RA Percent Paced: 97.37 %
Brady Statistic RV Percent Paced: 99.94 %
Date Time Interrogation Session: 20250630221207
Implantable Lead Connection Status: 753985
Implantable Lead Connection Status: 753985
Implantable Lead Implant Date: 20211001
Implantable Lead Implant Date: 20211001
Implantable Lead Location: 753859
Implantable Lead Location: 753860
Implantable Lead Model: 5076
Implantable Lead Model: 5076
Implantable Pulse Generator Implant Date: 20211001
Lead Channel Impedance Value: 304 Ohm
Lead Channel Impedance Value: 399 Ohm
Lead Channel Impedance Value: 437 Ohm
Lead Channel Impedance Value: 494 Ohm
Lead Channel Pacing Threshold Amplitude: 0.5 V
Lead Channel Pacing Threshold Amplitude: 0.625 V
Lead Channel Pacing Threshold Pulse Width: 0.4 ms
Lead Channel Pacing Threshold Pulse Width: 0.4 ms
Lead Channel Sensing Intrinsic Amplitude: 0.75 mV
Lead Channel Sensing Intrinsic Amplitude: 0.75 mV
Lead Channel Sensing Intrinsic Amplitude: 1.875 mV
Lead Channel Sensing Intrinsic Amplitude: 1.875 mV
Lead Channel Setting Pacing Amplitude: 1.5 V
Lead Channel Setting Pacing Amplitude: 2 V
Lead Channel Setting Pacing Pulse Width: 0.4 ms
Lead Channel Setting Sensing Sensitivity: 0.9 mV
Zone Setting Status: 755011
Zone Setting Status: 755011

## 2023-12-02 ENCOUNTER — Ambulatory Visit: Payer: Self-pay | Admitting: Cardiology

## 2024-02-18 ENCOUNTER — Other Ambulatory Visit: Payer: Self-pay | Admitting: Pulmonary Disease

## 2024-02-18 DIAGNOSIS — J449 Chronic obstructive pulmonary disease, unspecified: Secondary | ICD-10-CM

## 2024-03-01 ENCOUNTER — Ambulatory Visit: Payer: Medicare Other

## 2024-03-01 DIAGNOSIS — I493 Ventricular premature depolarization: Secondary | ICD-10-CM

## 2024-03-03 LAB — CUP PACEART REMOTE DEVICE CHECK
Battery Remaining Longevity: 92 mo
Battery Voltage: 2.99 V
Brady Statistic AP VP Percent: 98.18 %
Brady Statistic AP VS Percent: 0.05 %
Brady Statistic AS VP Percent: 1.74 %
Brady Statistic AS VS Percent: 0.04 %
Brady Statistic RA Percent Paced: 98.23 %
Brady Statistic RV Percent Paced: 99.91 %
Date Time Interrogation Session: 20250930112427
Implantable Lead Connection Status: 753985
Implantable Lead Connection Status: 753985
Implantable Lead Implant Date: 20211001
Implantable Lead Implant Date: 20211001
Implantable Lead Location: 753859
Implantable Lead Location: 753860
Implantable Lead Model: 5076
Implantable Lead Model: 5076
Implantable Pulse Generator Implant Date: 20211001
Lead Channel Impedance Value: 342 Ohm
Lead Channel Impedance Value: 399 Ohm
Lead Channel Impedance Value: 418 Ohm
Lead Channel Impedance Value: 456 Ohm
Lead Channel Pacing Threshold Amplitude: 0.5 V
Lead Channel Pacing Threshold Amplitude: 0.625 V
Lead Channel Pacing Threshold Pulse Width: 0.4 ms
Lead Channel Pacing Threshold Pulse Width: 0.4 ms
Lead Channel Sensing Intrinsic Amplitude: 0.75 mV
Lead Channel Sensing Intrinsic Amplitude: 0.75 mV
Lead Channel Sensing Intrinsic Amplitude: 3.125 mV
Lead Channel Sensing Intrinsic Amplitude: 3.125 mV
Lead Channel Setting Pacing Amplitude: 1.5 V
Lead Channel Setting Pacing Amplitude: 2 V
Lead Channel Setting Pacing Pulse Width: 0.4 ms
Lead Channel Setting Sensing Sensitivity: 0.9 mV
Zone Setting Status: 755011
Zone Setting Status: 755011

## 2024-03-04 ENCOUNTER — Ambulatory Visit: Payer: Self-pay | Admitting: Cardiology

## 2024-03-07 NOTE — Progress Notes (Signed)
 Remote PPM Transmission

## 2024-03-10 ENCOUNTER — Encounter: Payer: Self-pay | Admitting: Nurse Practitioner

## 2024-03-10 ENCOUNTER — Ambulatory Visit: Admitting: Nurse Practitioner

## 2024-03-10 VITALS — BP 120/68 | HR 71 | Temp 98.0°F | Ht 73.0 in | Wt 209.8 lb

## 2024-03-10 DIAGNOSIS — J449 Chronic obstructive pulmonary disease, unspecified: Secondary | ICD-10-CM | POA: Diagnosis not present

## 2024-03-10 MED ORDER — SPIRIVA RESPIMAT 2.5 MCG/ACT IN AERS: 2.0000 | INHALATION_SPRAY | Freq: Every day | RESPIRATORY_TRACT | 11 refills | Status: DC

## 2024-03-10 MED ORDER — SPIRIVA RESPIMAT 2.5 MCG/ACT IN AERS: 2.0000 | INHALATION_SPRAY | Freq: Every day | RESPIRATORY_TRACT | Status: DC

## 2024-03-10 MED ORDER — ALBUTEROL SULFATE HFA 108 (90 BASE) MCG/ACT IN AERS: 2.0000 | INHALATION_SPRAY | Freq: Four times a day (QID) | RESPIRATORY_TRACT | 2 refills | Status: AC | PRN

## 2024-03-10 NOTE — Progress Notes (Signed)
 Remote PPM Transmission

## 2024-03-10 NOTE — Progress Notes (Signed)
 @Patient  ID: [name] [name], male    DOB: 1940/11/13, 83 y.o.   MRN: [id number]  Chief Complaint  Patient presents with   Follow-up    Sob with exertion, some better, dry cough, denies wheezing    Referring provider: Cleotilde Planas, MD  HPI: 83 year old male, former smoker followed for COPD. He is a patient of Dr. Jiles or last seen in office 06/23/2023 by [facility] NP. Past medical history significant for HTN, right BBB, SSS s/p pacemaker.   TEST/EVENTS:  12/01/2019 PFT: FVC 108, FEV1 95, ratio 64, TLC 126, DLCO 90 04/09/2023 CXR: new patchy opacities in lingula, suspicious for pneumonia. Linear opacities in lung bases, likely atelectasis.  06/23/2023 CXR: chronic linear lung base scarring   07/28/2022: OV with Dr. Theophilus. Continues on Dulera . Has previously tried Anoro. Complains of mild chest tightness and heartburn symptoms which is intermittent in nature. Has taken OTC antiacid without any change. Advised OTC Prilosec and Tums. Pulmonary rehab discussed but deferred.  04/09/2023: Ov with Aadarsh Cozort NP The patient, with a history of COPD, presented with a persistent cough that has been ongoing for approximately a month, starting in early October. He initially sought medical attention in late October and was prescribed doxycycline and prednisone  by their PCP. The patient reported a temporary improvement in symptoms, but the cough returned. The cough is described as mostly dry, with a slight sensation of wetness in the throat, but no expectoration. The patient also reported a burning sensation in the lungs, particularly when coughing, but also intermittently at rest. The patient denied any significant dyspnea or wheezing, although he noted a slight wheeze. He reported a possible fever at the onset of the symptoms, but denied any current fever or chills. There was no hemoptysis, leg swelling, or abnormal weight gain. The patient's appetite has remained normal. The patient has been using Dulera  for his  COPD for several years and reported no daily difficulties with his breathing. This is the first time this year he has required steroids and antibiotics. The patient denied any sinus symptoms that could be contributing to the cough. The patient's COPD has been well-managed with Dulera , and he has not required a rescue inhaler.  The patient was not experiencing any significant breathing difficulties at the time of the visit.   04/23/2023: OV with Brandonlee Navis NP for follow up. He's feeling much better. Cough is resolved. Not noticing any wheezing. Breathing feels like it's back to his normal. He's not had to use his rescue inhaler. Completed his steroids and antibiotics. Eating and drinking well. No fevers, chills, hemoptysis. Using Dulera  twice a day.  06/23/2023: [name] with Beena Catano NP Patient presents today for follow-up and surgical preop risk stratification.  He is debating whether or not he wants to have an inguinal hernia repair.  Surgery date is currently pending with Dr. Paola.  He will make the final decision over the next couple of weeks. Regarding his breathing, he has been doing well since he was here last.  He has not had any issues with his breathing or cough since he completed treatment for his pneumonia in November 2024.  No issues with wheezing.  Not having to use his rescue inhaler.  Still doing the Dulera  twice daily.  03/10/2024: Today - follow up Discussed the use of AI scribe software for clinical note transcription with the patient, who gave verbal consent to proceed.  History of Present Illness [name] [name] is an 83 year old male who presents for  follow up.   About a month ago, he experienced burning sensations in his lungs while at the gym, with a drop in oxygen  saturation to 89-90% as measured by a pulse oximeter. He was at a high altitude in [place]  during this time, which exacerbated his symptoms. The symptoms resolved on their own without the use of steroids or antibiotics.  He  does feel like his breathing is a little more short over the past 6 months when doing more strenuous exercise. He's not sure if he needs a new inhaler or if the current one is still working. He denies any wheezing.   He is currently using Dulera , two puffs twice a day, which costs him about twenty dollars a month. He has tried another inhaler in the past that was more expensive and did not provide additional benefit. He does not currently use a rescue inhaler, although he was previously prescribed one but never filled it.  He experiences occasional coughing without sputum production and denies hemoptysis. His weight has remained stable, and he maintains a good appetite and hydration. No fevers, night sweats, CP, leg swelling.    Allergies  Allergen Reactions   Nsaids     REACTION: abnormal kidney fuction    Immunization History  Administered Date(s) Administered   Fluad Quad(high Dose 65+) 02/01/2020, 03/11/2021, 03/02/2023   INFLUENZA, HIGH DOSE SEASONAL PF 05/23/2015, 03/06/2017, 04/13/2017   Influenza Split 05/23/2015   Influenza-Unspecified 03/16/2013   Moderna SARS-COV2 Booster Vaccination 03/02/2023   PFIZER(Purple Top)SARS-COV-2 Vaccination 06/14/2019, 07/04/2019, 02/01/2020   Pneumococcal Conjugate-13 08/19/2013   Pneumococcal Polysaccharide-23 04/21/2007, 06/06/2007, 06/02/2013   Td 03/27/2009   Tdap 03/06/2017   Zoster, Live 04/04/2008    Past Medical History:  Diagnosis Date   Arthritis    Chronic kidney disease    high creatinine level    Colon polyp 07/2008   colonoscopy with Dr Celestia   COPD (chronic obstructive pulmonary disease) (HCC)    mild    Elevated fasting glucose    Hepatitis    History of BPH    with obstruction and hydronephrosis and slihtly elevated creatinine, using self catherization, followed by urologist Dr McDiarmid   History of ETT 2012   [name]   History of tobacco use    54 pack yr   HTN (hypertension)    Paroxysmal supraventricular  tachycardia    Pneumonia    Presence of permanent cardiac pacemaker    Screening for AAA (abdominal aortic aneurysm) 08/2014   3cm, nl/early aneurysm, repeat 3 yrs with u/s 3/19    Tobacco History: Social History   Tobacco Use  Smoking Status Former   Current packs/day: 0.00   Average packs/day: 3.0 packs/day for 30.0 years (90.0 ttl pk-yrs)   Types: Cigarettes   Start date: 06/03/1955   Quit date: 06/02/1985   Years since quitting: 38.7  Smokeless Tobacco Never   Counseling given: Not Answered   Outpatient Medications Prior to Visit  Medication Sig Dispense Refill   acetaminophen  (TYLENOL ) 500 MG tablet Take 2 tablets (1,000 mg total) by mouth 4 (four) times daily. 120 tablet 3   amLODipine  (NORVASC ) 10 MG tablet Take 1 tablet (10 mg total) by mouth daily. 180 tablet 3   atorvastatin  (LIPITOR) 10 MG tablet Take 10 mg by mouth daily.     DULERA  100-5 MCG/ACT AERO INHALE 2 PUFFS BY MOUTH TWICE A DAY IN THE MORNING AND AT BEDTIME (Patient taking differently: 2 puffs. Twice a day) 13 g 2  losartan  (COZAAR ) 100 MG tablet Take 100 mg by mouth daily.     trimethoprim  (TRIMPEX ) 100 MG tablet Take 100 mg by mouth daily.     docusate sodium  (COLACE) 100 MG capsule Take 1 capsule (100 mg total) by mouth 2 (two) times daily. (Patient not taking: Reported on 03/10/2024) 60 capsule 2   methocarbamol  (ROBAXIN ) 750 MG tablet Take 1 tablet (750 mg total) by mouth 4 (four) times daily. (Patient not taking: Reported on 03/10/2024) 120 tablet 1   oxyCODONE  (ROXICODONE ) 5 MG immediate release tablet Take 1 tablet (5 mg total) by mouth every 4 (four) hours as needed. (Patient not taking: Reported on 03/10/2024) 15 tablet 0   No facility-administered medications prior to visit.     Review of Systems: As above    Physical Exam:  BP 120/68 (BP Location: Left Arm, Patient Position: Sitting, Cuff Size: Large)   Pulse 71   Temp 98 F (36.7 C) (Oral)   Ht 6' 1 (1.854 m)   Wt 209 lb 12.8 oz (95.2 kg)    SpO2 96%   BMI 27.68 kg/m   GEN: Pleasant, interactive, well-appearing; elderly; in no acute distress HEENT:  Normocephalic and atraumatic. PERRLA. Sclera white. Nasal turbinates pink, moist and patent bilaterally. No rhinorrhea present. Oropharynx pink and moist, without exudate or edema. No lesions, ulcerations, or postnasal drip.  NECK:  Supple w/ fair ROM. No JVD present.  CV: RRR, no m/r/g, no peripheral edema. Pulses intact, +2 bilaterally. No cyanosis, pallor or clubbing. PULMONARY:  Unlabored, regular breathing. Clear bilaterally A&P w/o wheezes/rales/rhonchi. No accessory muscle use.  GI: BS present and normoactive. Soft, non-tender to palpation.  MSK: No erythema, warmth or tenderness. Cap refil <2 sec all extrem. Neuro: A/Ox3. No focal deficits noted.   Skin: Warm, no lesions or rashe Psych: Normal affect and behavior. Judgement and thought content appropriate.     Lab Results:  CBC    Component Value Date/Time   WBC 11.3 (H) 10/21/2023 0900   RBC 4.61 10/21/2023 0900   HGB 13.3 10/21/2023 0900   HCT 40.3 10/21/2023 0900   PLT 164 10/21/2023 0900   MCV 87.4 10/21/2023 0900   MCH 28.9 10/21/2023 0900   MCHC 33.0 10/21/2023 0900   RDW 13.6 10/21/2023 0900   LYMPHSABS 4.0 10/11/2020 1420   MONOABS 0.7 10/11/2020 1420   EOSABS 0.2 10/11/2020 1420   BASOSABS 0.0 10/11/2020 1420    BMET    Component Value Date/Time   NA 135 10/21/2023 0900   NA 140 09/08/2022 0848   K 3.9 10/21/2023 0900   CL 104 10/21/2023 0900   CO2 23 10/21/2023 0900   GLUCOSE 112 (H) 10/21/2023 0900   BUN 19 10/21/2023 0900   BUN 22 09/08/2022 0848   CREATININE 1.42 (H) 10/21/2023 0900   CALCIUM  8.0 (L) 10/21/2023 0900   GFRNONAA 49 (L) 10/21/2023 0900   GFRAA >60 02/03/2020 0547    BNP No results found for: BNP   Imaging:  CUP PACEART REMOTE DEVICE CHECK Result Date: 03/03/2024 PPM Scheduled remote reviewed. Normal device function.  Presenting rhythm:  AP/VP Next remote  transmission per protocol. LA, CVRS   Administration History     None          Latest Ref Rng & Units 12/01/2019    8:56 AM 08/11/2017   10:53 AM  PFT Results  FVC-Pre L 4.81  4.52   FVC-Predicted Pre % 108  100   FVC-Post L 4.74  4.72  FVC-Predicted Post % 107  105   Pre FEV1/FVC % % 63  57   Post FEV1/FCV % % 64  62   FEV1-Pre L 3.01  2.57   FEV1-Predicted Pre % 95  79   FEV1-Post L 3.03  2.93   DLCO uncorrected ml/min/mmHg 23.57  24.52   DLCO UNC% % 90  70   DLCO corrected ml/min/mmHg 42.97  24.81   DLCO COR %Predicted % 164  70   DLVA Predicted % 135  62   TLC L 9.46  10.61   TLC % Predicted % 126  142   RV % Predicted % 157  205     No results found for: NITRICOXIDE      Assessment & Plan:   COPD, mild (HCC) Mild COPD with FEV1 95. Progression of symptoms. No acute exacerbation. Lung exam reassuring and VS stable on room air. Will hold on repeat imaging at this time. Trial step up to triple therapy with addition of Spiriva 2 puffs daily. Side effect profile reviewed and teachback performed. He will continue Dulera . Resent albuterol  PRN for rescue use. Graded exercises encouraged. Action plan in place.   Patient Instructions  Continue Dulera  2 puffs Twice daily. Brush tongue and rinse mouth afterwards Continue Albuterol  inhaler 2 puffs every 6 hours as needed for shortness of breath or wheezing. Notify if symptoms persist despite rescue inhaler/neb use.   Start Spiriva 2 puffs once daily in morning. Notify of any vision changes or urinary retention    Follow up in 6 weeks with Dr. Theophilus or Katie Yuri Fana,NP. If symptoms do not improve or worsen, please contact office for sooner follow up or seek emergency care.     Advised if symptoms do not improve or worsen, to please contact office for sooner follow up or seek emergency care.   I spent 32 minutes of dedicated to the care of this patient on the date of this encounter to include pre-visit review of records,  face-to-face time with the patient discussing conditions above, post visit ordering of testing, clinical documentation with the electronic health record, making appropriate referrals as documented, and communicating necessary findings to members of the patients care team.  Comer LULLA Rouleau, NP 03/10/2024  Pt aware and understands NP's role.

## 2024-03-10 NOTE — Assessment & Plan Note (Signed)
 Mild COPD with FEV1 95. Progression of symptoms. No acute exacerbation. Lung exam reassuring and VS stable on room air. Will hold on repeat imaging at this time. Trial step up to triple therapy with addition of Spiriva 2 puffs daily. Side effect profile reviewed and teachback performed. He will continue Dulera . Resent albuterol  PRN for rescue use. Graded exercises encouraged. Action plan in place.   Patient Instructions  Continue Dulera  2 puffs Twice daily. Brush tongue and rinse mouth afterwards Continue Albuterol  inhaler 2 puffs every 6 hours as needed for shortness of breath or wheezing. Notify if symptoms persist despite rescue inhaler/neb use.   Start Spiriva 2 puffs once daily in morning. Notify of any vision changes or urinary retention    Follow up in 6 weeks with Dr. Theophilus or Katie Iviona Hole,NP. If symptoms do not improve or worsen, please contact office for sooner follow up or seek emergency care.

## 2024-03-10 NOTE — Patient Instructions (Addendum)
 Continue Dulera  2 puffs Twice daily. Brush tongue and rinse mouth afterwards Continue Albuterol  inhaler 2 puffs every 6 hours as needed for shortness of breath or wheezing. Notify if symptoms persist despite rescue inhaler/neb use.   Start Spiriva 2 puffs once daily in morning. Notify of any vision changes or urinary retention    Follow up in 6 weeks with Dr. Theophilus or Katie Reggie Welge,NP. If symptoms do not improve or worsen, please contact office for sooner follow up or seek emergency care.

## 2024-03-23 DIAGNOSIS — N390 Urinary tract infection, site not specified: Secondary | ICD-10-CM | POA: Diagnosis not present

## 2024-03-23 DIAGNOSIS — R509 Fever, unspecified: Secondary | ICD-10-CM | POA: Diagnosis not present

## 2024-04-07 ENCOUNTER — Ambulatory Visit: Admitting: Nurse Practitioner

## 2024-05-05 ENCOUNTER — Ambulatory Visit: Admitting: Nurse Practitioner

## 2024-05-05 ENCOUNTER — Encounter: Payer: Self-pay | Admitting: Nurse Practitioner

## 2024-05-05 VITALS — BP 124/77 | HR 69 | Temp 97.6°F | Ht 73.0 in | Wt 215.6 lb

## 2024-05-05 DIAGNOSIS — J449 Chronic obstructive pulmonary disease, unspecified: Secondary | ICD-10-CM | POA: Diagnosis not present

## 2024-05-05 NOTE — Progress Notes (Signed)
 @Patient  ID: [name] [name], male    DOB: [birth year], 83 y.o.   MRN: [id number]  Chief Complaint  Patient presents with   COPD    6 week follow up     Referring provider: Cleotilde Planas, MD  HPI: 83 year old male, former smoker followed for COPD. He is a patient of Dr. Jiles or last seen in office 03/10/2024 by [facility] NP. Past medical history significant for HTN, right BBB, SSS s/p pacemaker.   TEST/EVENTS:  12/01/2019 PFT: FVC 108, FEV1 95, ratio 64, TLC 126, DLCO 90 04/09/2023 CXR: new patchy opacities in lingula, suspicious for pneumonia. Linear opacities in lung bases, likely atelectasis.  06/23/2023 CXR: chronic linear lung base scarring   07/28/2022: OV with Dr. Theophilus. Continues on Dulera . Has previously tried Anoro. Complains of mild chest tightness and heartburn symptoms which is intermittent in nature. Has taken OTC antiacid without any change. Advised OTC Prilosec and Tums. Pulmonary rehab discussed but deferred.  04/09/2023: Ov with Kees Idrovo NP The patient, with a history of COPD, presented with a persistent cough that has been ongoing for approximately a month, starting in early October. He initially sought medical attention in late October and was prescribed doxycycline and prednisone  by their PCP. The patient reported a temporary improvement in symptoms, but the cough returned. The cough is described as mostly dry, with a slight sensation of wetness in the throat, but no expectoration. The patient also reported a burning sensation in the lungs, particularly when coughing, but also intermittently at rest. The patient denied any significant dyspnea or wheezing, although he noted a slight wheeze. He reported a possible fever at the onset of the symptoms, but denied any current fever or chills. There was no hemoptysis, leg swelling, or abnormal weight gain. The patient's appetite has remained normal. The patient has been using Dulera  for his COPD for several years and reported no daily  difficulties with his breathing. This is the first time this year he has required steroids and antibiotics. The patient denied any sinus symptoms that could be contributing to the cough. The patient's COPD has been well-managed with Dulera , and he has not required a rescue inhaler.  The patient was not experiencing any significant breathing difficulties at the time of the visit.   04/23/2023: OV with Loraina Stauffer NP for follow up. He's feeling much better. Cough is resolved. Not noticing any wheezing. Breathing feels like it's back to his normal. He's not had to use his rescue inhaler. Completed his steroids and antibiotics. Eating and drinking well. No fevers, chills, hemoptysis. Using Dulera  twice a day.  06/23/2023: [name] [name] with Joretta Eads NP Patient presents today for follow-up and surgical preop risk stratification.  He is debating whether or not he wants to have an inguinal hernia repair.  Surgery date is currently pending with Dr. Paola.  He will make the final decision over the next couple of weeks. Regarding his breathing, he has been doing well since he was here last.  He has not had any issues with his breathing or cough since he completed treatment for his pneumonia in November 2024.  No issues with wheezing.  Not having to use his rescue inhaler.  Still doing the Dulera  twice daily.  03/10/2024: Ov with Lainey Nelson NP. [name] [name] is an 83 year old male who presents for follow up.  About a month ago, he experienced burning sensations in his lungs while at the gym, with a drop in oxygen  saturation to 89-90% as measured by  a pulse oximeter. He was at a high altitude in [place]  during this time, which exacerbated his symptoms. The symptoms resolved on their own without the use of steroids or antibiotics. He does feel like his breathing is a little more short over the past 6 months when doing more strenuous exercise. He's not sure if he needs a new inhaler or if the current one is still working. He denies any  wheezing.  He is currently using Dulera , two puffs twice a day, which costs him about twenty dollars a month. He has tried another inhaler in the past that was more expensive and did not provide additional benefit. He does not currently use a rescue inhaler, although he was previously prescribed one but never filled it. He experiences occasional coughing without sputum production and denies hemoptysis. His weight has remained stable, and he maintains a good appetite and hydration. No fevers, night sweats, CP, leg swelling.  05/05/2024: Today - follow up Discussed the use of AI scribe software for clinical note transcription with the patient, who gave verbal consent to proceed.  History of Present Illness [name] [name] is an 83 year old male with chronic obstructive pulmonary disease (COPD) who presents for follow-up of his respiratory symptoms.  He feels like he hasn't had any issues with his breathing, and feels it was actually getting better before he started the Spiriva . He's not sure if the Spiriva  has made a difference. He'd like to try himself off of it. He uses Dulera  regularly and has not needed to use his albuterol  inhaler recently.  He experiences a mild, occasional cough that is not bothersome and does not produce sputum. He is able to perform activities around the house and go to the gym, where he can elevate his pulse to about 140 beats per minute. He has not noticed any issues with his breathing related to the colder weather.  He confirms that he received his flu shot.    Allergies  Allergen Reactions   Nsaids     REACTION: abnormal kidney fuction    Immunization History  Administered Date(s) Administered   Fluad Quad(high Dose 65+) 02/01/2020, 03/11/2021, 03/02/2023   INFLUENZA, HIGH DOSE SEASONAL PF 05/23/2015, 03/06/2017, 04/13/2017   Influenza Split 05/23/2015   Influenza-Unspecified 03/16/2013, 02/17/2024   Moderna SARS-COV2 Booster Vaccination 03/02/2023    PFIZER(Purple Top)SARS-COV-2 Vaccination 06/14/2019, 07/04/2019, 02/01/2020   Pneumococcal Conjugate-13 08/19/2013   Pneumococcal Polysaccharide-23 04/21/2007, 06/06/2007, 06/02/2013   Td 03/27/2009   Tdap 03/06/2017   Zoster, Live 04/04/2008    Past Medical History:  Diagnosis Date   Arthritis    Chronic kidney disease    high creatinine level    Colon polyp 07/2008   colonoscopy with Dr Celestia   COPD (chronic obstructive pulmonary disease) (HCC)    mild    Elevated fasting glucose    Hepatitis    History of BPH    with obstruction and hydronephrosis and slihtly elevated creatinine, using self catherization, followed by urologist Dr McDiarmid   History of ETT 2012   [name]   History of tobacco use    54 pack yr   HTN (hypertension)    Paroxysmal supraventricular tachycardia    Pneumonia    Presence of permanent cardiac pacemaker    Screening for AAA (abdominal aortic aneurysm) 08/2014   3cm, nl/early aneurysm, repeat 3 yrs with u/s 3/19    Tobacco History: Social History   Tobacco Use  Smoking Status Former   Current packs/day: 0.00  Average packs/day: 3.0 packs/day for 30.0 years (90.0 ttl pk-yrs)   Types: Cigarettes   Start date: 06/03/1955   Quit date: 06/02/1985   Years since quitting: 38.9  Smokeless Tobacco Never   Counseling given: Not Answered   Outpatient Medications Prior to Visit  Medication Sig Dispense Refill   acetaminophen  (TYLENOL ) 500 MG tablet Take 2 tablets (1,000 mg total) by mouth 4 (four) times daily. 120 tablet 3   albuterol  (VENTOLIN  HFA) 108 (90 Base) MCG/ACT inhaler Inhale 2 puffs into the lungs every 6 (six) hours as needed for wheezing or shortness of breath. 8 g 2   amLODipine  (NORVASC ) 10 MG tablet Take 1 tablet (10 mg total) by mouth daily. 180 tablet 3   atorvastatin  (LIPITOR) 10 MG tablet Take 10 mg by mouth daily.     DULERA  100-5 MCG/ACT AERO INHALE 2 PUFFS BY MOUTH TWICE A DAY IN THE MORNING AND AT BEDTIME (Patient taking  differently: 2 puffs. Twice a day) 13 g 2   losartan  (COZAAR ) 100 MG tablet Take 100 mg by mouth daily.     trimethoprim  (TRIMPEX ) 100 MG tablet Take 100 mg by mouth daily.     Tiotropium Bromide (SPIRIVA  RESPIMAT) 2.5 MCG/ACT AERS Inhale 2 puffs into the lungs daily.     Tiotropium Bromide Monohydrate  (SPIRIVA  RESPIMAT) 2.5 MCG/ACT AERS Inhale 2 puffs into the lungs daily. 4 g 11   docusate sodium  (COLACE) 100 MG capsule Take 1 capsule (100 mg total) by mouth 2 (two) times daily. (Patient not taking: Reported on 03/10/2024) 60 capsule 2   methocarbamol  (ROBAXIN ) 750 MG tablet Take 1 tablet (750 mg total) by mouth 4 (four) times daily. (Patient not taking: Reported on 03/10/2024) 120 tablet 1   oxyCODONE  (ROXICODONE ) 5 MG immediate release tablet Take 1 tablet (5 mg total) by mouth every 4 (four) hours as needed. (Patient not taking: Reported on 03/10/2024) 15 tablet 0   No facility-administered medications prior to visit.     Review of Systems: As above    Physical Exam:  BP 124/77   Pulse 69   Temp 97.6 F (36.4 C) (Oral)   Ht 6' 1 (1.854 m)   Wt 215 lb 9.6 oz (97.8 kg)   SpO2 97%   BMI 28.44 kg/m   GEN: Pleasant, interactive, well-appearing; elderly; in no acute distress HEENT:  Normocephalic and atraumatic. PERRLA. Sclera white. Nasal turbinates pink, moist and patent bilaterally. No rhinorrhea present. Oropharynx pink and moist, without exudate or edema. No lesions, ulcerations, or postnasal drip.  NECK:  Supple w/ fair ROM. No JVD present.  CV: RRR, no m/r/g, no peripheral edema. Pulses intact, +2 bilaterally. No cyanosis, pallor or clubbing. PULMONARY:  Unlabored, regular breathing. Clear bilaterally A&P w/o wheezes/rales/rhonchi. No accessory muscle use.  GI: BS present and normoactive. Soft, non-tender to palpation.  MSK: No erythema, warmth or tenderness. Cap refil <2 sec all extrem. Neuro: A/Ox3. No focal deficits noted.   Skin: Warm, no lesions or rashe Psych: Normal  affect and behavior. Judgement and thought content appropriate.     Lab Results:  CBC    Component Value Date/Time   WBC 11.3 (H) 10/21/2023 0900   RBC 4.61 10/21/2023 0900   HGB 13.3 10/21/2023 0900   HCT 40.3 10/21/2023 0900   PLT 164 10/21/2023 0900   MCV 87.4 10/21/2023 0900   MCH 28.9 10/21/2023 0900   MCHC 33.0 10/21/2023 0900   RDW 13.6 10/21/2023 0900   LYMPHSABS 4.0 10/11/2020 1420   MONOABS  0.7 10/11/2020 1420   EOSABS 0.2 10/11/2020 1420   BASOSABS 0.0 10/11/2020 1420    BMET    Component Value Date/Time   NA 135 10/21/2023 0900   NA 140 09/08/2022 0848   K 3.9 10/21/2023 0900   CL 104 10/21/2023 0900   CO2 23 10/21/2023 0900   GLUCOSE 112 (H) 10/21/2023 0900   BUN 19 10/21/2023 0900   BUN 22 09/08/2022 0848   CREATININE 1.42 (H) 10/21/2023 0900   CALCIUM  8.0 (L) 10/21/2023 0900   GFRNONAA 49 (L) 10/21/2023 0900   GFRAA >60 02/03/2020 0547    BNP No results found for: BNP   Imaging:  No results found.   Administration History     None          Latest Ref Rng & Units 12/01/2019    8:56 AM 08/11/2017   10:53 AM  PFT Results  FVC-Pre L 4.81  4.52   FVC-Predicted Pre % 108  100   FVC-Post L 4.74  4.72   FVC-Predicted Post % 107  105   Pre FEV1/FVC % % 63  57   Post FEV1/FCV % % 64  62   FEV1-Pre L 3.01  2.57   FEV1-Predicted Pre % 95  79   FEV1-Post L 3.03  2.93   DLCO uncorrected ml/min/mmHg 23.57  24.52   DLCO UNC% % 90  70   DLCO corrected ml/min/mmHg 42.97  24.81   DLCO COR %Predicted % 164  70   DLVA Predicted % 135  62   TLC L 9.46  10.61   TLC % Predicted % 126  142   RV % Predicted % 157  205     No results found for: NITRICOXIDE      Assessment & Plan:   COPD, mild (HCC) Mild COPD with FEV1 95. Stable symptoms. He seems to be improved with Spiriva  but he does not have any perceived benefit. Requesting trial off of the Spiriva . Ok to stop and continue Dulera  twice daily. If symptoms worsen, advised to resume  Spiriva  and continue dulera  for triple therapy regimen. Aware of proper inhaler technique. Encouraged to remain active. Trigger prevention reviewed. UTD on vaccines. Action plan in place.   Patient Instructions  Continue Dulera  2 puffs Twice daily. Brush tongue and rinse mouth afterwards Continue Albuterol  inhaler 2 puffs every 6 hours as needed for shortness of breath or wheezing. Notify if symptoms persist despite rescue inhaler/neb use.    Stop Spiriva  and see how you feel    Follow up in 6 months with Dr. Theophilus. If symptoms do not improve or worsen, please contact office for sooner follow up or seek emergency care.    Advised if symptoms do not improve or worsen, to please contact office for sooner follow up or seek emergency care.   I spent 25 minutes of dedicated to the care of this patient on the date of this encounter to include pre-visit review of records, face-to-face time with the patient discussing conditions above, post visit ordering of testing, clinical documentation with the electronic health record, making appropriate referrals as documented, and communicating necessary findings to members of the patients care team.  Comer LULLA Rouleau, NP 05/05/2024  Pt aware and understands NP's role.

## 2024-05-05 NOTE — Assessment & Plan Note (Signed)
 Mild COPD with FEV1 95. Stable symptoms. He seems to be improved with Spiriva  but he does not have any perceived benefit. Requesting trial off of the Spiriva . Ok to stop and continue Dulera  twice daily. If symptoms worsen, advised to resume Spiriva  and continue dulera  for triple therapy regimen. Aware of proper inhaler technique. Encouraged to remain active. Trigger prevention reviewed. UTD on vaccines. Action plan in place.   Patient Instructions  Continue Dulera  2 puffs Twice daily. Brush tongue and rinse mouth afterwards Continue Albuterol  inhaler 2 puffs every 6 hours as needed for shortness of breath or wheezing. Notify if symptoms persist despite rescue inhaler/neb use.    Stop Spiriva  and see how you feel    Follow up in 6 months with Dr. Theophilus. If symptoms do not improve or worsen, please contact office for sooner follow up or seek emergency care.

## 2024-05-05 NOTE — Patient Instructions (Addendum)
 Continue Dulera  2 puffs Twice daily. Brush tongue and rinse mouth afterwards Continue Albuterol  inhaler 2 puffs every 6 hours as needed for shortness of breath or wheezing. Notify if symptoms persist despite rescue inhaler/neb use.    Stop Spiriva  and see how you feel    Follow up in 6 months with Dr. Theophilus. If symptoms do not improve or worsen, please contact office for sooner follow up or seek emergency care.

## 2024-05-31 ENCOUNTER — Ambulatory Visit: Payer: Medicare Other

## 2024-05-31 DIAGNOSIS — I493 Ventricular premature depolarization: Secondary | ICD-10-CM

## 2024-06-02 LAB — CUP PACEART REMOTE DEVICE CHECK
Battery Remaining Longevity: 88 mo
Battery Voltage: 2.99 V
Brady Statistic AP VP Percent: 90.96 %
Brady Statistic AP VS Percent: 0.08 %
Brady Statistic AS VP Percent: 8.8 %
Brady Statistic AS VS Percent: 0.16 %
Brady Statistic RA Percent Paced: 91.05 %
Brady Statistic RV Percent Paced: 99.76 %
Date Time Interrogation Session: 20251230024535
Implantable Lead Connection Status: 753985
Implantable Lead Connection Status: 753985
Implantable Lead Implant Date: 20211001
Implantable Lead Implant Date: 20211001
Implantable Lead Location: 753859
Implantable Lead Location: 753860
Implantable Lead Model: 5076
Implantable Lead Model: 5076
Implantable Pulse Generator Implant Date: 20211001
Lead Channel Impedance Value: 323 Ohm
Lead Channel Impedance Value: 361 Ohm
Lead Channel Impedance Value: 418 Ohm
Lead Channel Impedance Value: 418 Ohm
Lead Channel Pacing Threshold Amplitude: 0.5 V
Lead Channel Pacing Threshold Amplitude: 0.625 V
Lead Channel Pacing Threshold Pulse Width: 0.4 ms
Lead Channel Pacing Threshold Pulse Width: 0.4 ms
Lead Channel Sensing Intrinsic Amplitude: 0.75 mV
Lead Channel Sensing Intrinsic Amplitude: 0.75 mV
Lead Channel Sensing Intrinsic Amplitude: 2.625 mV
Lead Channel Sensing Intrinsic Amplitude: 2.625 mV
Lead Channel Setting Pacing Amplitude: 1.5 V
Lead Channel Setting Pacing Amplitude: 2 V
Lead Channel Setting Pacing Pulse Width: 0.4 ms
Lead Channel Setting Sensing Sensitivity: 0.9 mV
Zone Setting Status: 755011
Zone Setting Status: 755011

## 2024-06-04 ENCOUNTER — Ambulatory Visit: Payer: Self-pay | Admitting: Cardiology

## 2024-06-06 NOTE — Progress Notes (Signed)
 Remote PPM Transmission

## 2024-06-14 ENCOUNTER — Other Ambulatory Visit: Payer: Self-pay

## 2024-06-14 DIAGNOSIS — J449 Chronic obstructive pulmonary disease, unspecified: Secondary | ICD-10-CM

## 2024-06-14 MED ORDER — DULERA 100-5 MCG/ACT IN AERO: INHALATION_SPRAY | RESPIRATORY_TRACT | 2 refills | Status: AC

## 2024-06-16 ENCOUNTER — Ambulatory Visit
Admission: RE | Admit: 2024-06-16 | Discharge: 2024-06-16 | Disposition: A | Source: Ambulatory Visit | Attending: Family Medicine | Admitting: Family Medicine

## 2024-06-16 ENCOUNTER — Other Ambulatory Visit: Payer: Self-pay | Admitting: Family Medicine

## 2024-06-16 DIAGNOSIS — L299 Pruritus, unspecified: Secondary | ICD-10-CM
